# Patient Record
Sex: Female | Born: 1970 | Race: White | Hispanic: No | Marital: Married | Smoking: Never smoker
Health system: Southern US, Community
[De-identification: ages and names within clinical notes are randomized; demographics above are authoritative.]

## PROBLEM LIST (undated history)

## (undated) DIAGNOSIS — L920 Granuloma annulare: Secondary | ICD-10-CM

## (undated) DIAGNOSIS — D649 Anemia, unspecified: Secondary | ICD-10-CM

## (undated) DIAGNOSIS — M2550 Pain in unspecified joint: Secondary | ICD-10-CM

## (undated) DIAGNOSIS — T7840XA Allergy, unspecified, initial encounter: Secondary | ICD-10-CM

## (undated) DIAGNOSIS — K589 Irritable bowel syndrome without diarrhea: Secondary | ICD-10-CM

## (undated) DIAGNOSIS — J45909 Unspecified asthma, uncomplicated: Secondary | ICD-10-CM

## (undated) DIAGNOSIS — N3289 Other specified disorders of bladder: Secondary | ICD-10-CM

## (undated) DIAGNOSIS — G43909 Migraine, unspecified, not intractable, without status migrainosus: Secondary | ICD-10-CM

## (undated) DIAGNOSIS — B009 Herpesviral infection, unspecified: Secondary | ICD-10-CM

## (undated) DIAGNOSIS — E7800 Pure hypercholesterolemia, unspecified: Secondary | ICD-10-CM

## (undated) DIAGNOSIS — E739 Lactose intolerance, unspecified: Secondary | ICD-10-CM

## (undated) DIAGNOSIS — F419 Anxiety disorder, unspecified: Secondary | ICD-10-CM

## (undated) DIAGNOSIS — I10 Essential (primary) hypertension: Secondary | ICD-10-CM

## (undated) DIAGNOSIS — K7581 Nonalcoholic steatohepatitis (NASH): Secondary | ICD-10-CM

## (undated) DIAGNOSIS — N951 Menopausal and female climacteric states: Secondary | ICD-10-CM

## (undated) DIAGNOSIS — G4730 Sleep apnea, unspecified: Secondary | ICD-10-CM

## (undated) DIAGNOSIS — M549 Dorsalgia, unspecified: Secondary | ICD-10-CM

## (undated) DIAGNOSIS — Z8669 Personal history of other diseases of the nervous system and sense organs: Secondary | ICD-10-CM

## (undated) DIAGNOSIS — K7400 Hepatic fibrosis, unspecified: Secondary | ICD-10-CM

## (undated) DIAGNOSIS — Z8709 Personal history of other diseases of the respiratory system: Secondary | ICD-10-CM

## (undated) DIAGNOSIS — T8859XA Other complications of anesthesia, initial encounter: Secondary | ICD-10-CM

## (undated) DIAGNOSIS — G4700 Insomnia, unspecified: Secondary | ICD-10-CM

## (undated) DIAGNOSIS — L739 Follicular disorder, unspecified: Secondary | ICD-10-CM

## (undated) DIAGNOSIS — E119 Type 2 diabetes mellitus without complications: Secondary | ICD-10-CM

## (undated) DIAGNOSIS — M7989 Other specified soft tissue disorders: Secondary | ICD-10-CM

## (undated) DIAGNOSIS — M1990 Unspecified osteoarthritis, unspecified site: Secondary | ICD-10-CM

## (undated) DIAGNOSIS — N390 Urinary tract infection, site not specified: Secondary | ICD-10-CM

## (undated) DIAGNOSIS — T4145XA Adverse effect of unspecified anesthetic, initial encounter: Secondary | ICD-10-CM

## (undated) DIAGNOSIS — R202 Paresthesia of skin: Secondary | ICD-10-CM

## (undated) DIAGNOSIS — D126 Benign neoplasm of colon, unspecified: Secondary | ICD-10-CM

## (undated) HISTORY — DX: Irritable bowel syndrome, unspecified: K58.9

## (undated) HISTORY — DX: Pain in unspecified joint: M25.50

## (undated) HISTORY — DX: Sleep apnea, unspecified: G47.30

## (undated) HISTORY — DX: Allergy, unspecified, initial encounter: T78.40XA

## (undated) HISTORY — DX: Benign neoplasm of colon, unspecified: D12.6

## (undated) HISTORY — DX: Granuloma annulare: L92.0

## (undated) HISTORY — PX: ESOPHAGOGASTRODUODENOSCOPY: SHX1529

## (undated) HISTORY — PX: TYMPANOSTOMY TUBE PLACEMENT: SHX32

## (undated) HISTORY — DX: Type 2 diabetes mellitus without complications: E11.9

## (undated) HISTORY — DX: Other specified soft tissue disorders: M79.89

## (undated) HISTORY — DX: Anxiety disorder, unspecified: F41.9

## (undated) HISTORY — PX: UPPER GASTROINTESTINAL ENDOSCOPY: SHX188

## (undated) HISTORY — DX: Migraine, unspecified, not intractable, without status migrainosus: G43.909

## (undated) HISTORY — DX: Lactose intolerance, unspecified: E73.9

## (undated) HISTORY — PX: OTHER SURGICAL HISTORY: SHX169

## (undated) HISTORY — PX: CARPAL TUNNEL RELEASE: SHX101

## (undated) HISTORY — DX: Herpesviral infection, unspecified: B00.9

## (undated) HISTORY — DX: Dorsalgia, unspecified: M54.9

## (undated) HISTORY — PX: CERVICAL DISC SURGERY: SHX588

## (undated) HISTORY — DX: Nonalcoholic steatohepatitis (NASH): K75.81

## (undated) HISTORY — DX: Menopausal and female climacteric states: N95.1

## (undated) HISTORY — DX: Pure hypercholesterolemia, unspecified: E78.00

## (undated) HISTORY — DX: Essential (primary) hypertension: I10

## (undated) HISTORY — DX: Unspecified osteoarthritis, unspecified site: M19.90

## (undated) HISTORY — PX: COLONOSCOPY: SHX174

---

## 1998-03-01 ENCOUNTER — Emergency Department (HOSPITAL_COMMUNITY): Admission: EM | Admit: 1998-03-01 | Discharge: 1998-03-01 | Payer: Self-pay

## 1998-08-06 ENCOUNTER — Encounter: Admission: RE | Admit: 1998-08-06 | Discharge: 1998-09-01 | Payer: Self-pay | Admitting: Orthopedic Surgery

## 1999-01-15 ENCOUNTER — Other Ambulatory Visit: Admission: RE | Admit: 1999-01-15 | Discharge: 1999-01-15 | Payer: Self-pay | Admitting: Obstetrics and Gynecology

## 2000-11-15 ENCOUNTER — Other Ambulatory Visit: Admission: RE | Admit: 2000-11-15 | Discharge: 2000-11-15 | Payer: Self-pay | Admitting: Obstetrics and Gynecology

## 2001-04-03 ENCOUNTER — Other Ambulatory Visit: Admission: RE | Admit: 2001-04-03 | Discharge: 2001-04-03 | Payer: Self-pay | Admitting: Obstetrics and Gynecology

## 2001-08-30 ENCOUNTER — Ambulatory Visit (HOSPITAL_COMMUNITY): Admission: AD | Admit: 2001-08-30 | Discharge: 2001-08-30 | Payer: Self-pay | Admitting: Internal Medicine

## 2001-11-02 ENCOUNTER — Inpatient Hospital Stay (HOSPITAL_COMMUNITY): Admission: RE | Admit: 2001-11-02 | Discharge: 2001-11-05 | Payer: Self-pay | Admitting: Obstetrics and Gynecology

## 2002-02-10 ENCOUNTER — Emergency Department (HOSPITAL_COMMUNITY): Admission: EM | Admit: 2002-02-10 | Discharge: 2002-02-10 | Payer: Self-pay | Admitting: Emergency Medicine

## 2002-03-19 ENCOUNTER — Encounter: Payer: Self-pay | Admitting: Gastroenterology

## 2002-03-20 ENCOUNTER — Encounter: Payer: Self-pay | Admitting: Gastroenterology

## 2003-01-02 ENCOUNTER — Encounter (INDEPENDENT_AMBULATORY_CARE_PROVIDER_SITE_OTHER): Payer: Self-pay | Admitting: *Deleted

## 2003-01-02 ENCOUNTER — Encounter (HOSPITAL_COMMUNITY): Admission: RE | Admit: 2003-01-02 | Discharge: 2003-02-01 | Payer: Self-pay | Admitting: Family Medicine

## 2003-01-02 ENCOUNTER — Encounter: Payer: Self-pay | Admitting: Family Medicine

## 2003-04-25 ENCOUNTER — Encounter: Payer: Self-pay | Admitting: Family Medicine

## 2003-04-25 ENCOUNTER — Ambulatory Visit (HOSPITAL_COMMUNITY): Admission: RE | Admit: 2003-04-25 | Discharge: 2003-04-25 | Payer: Self-pay | Admitting: Family Medicine

## 2003-07-26 HISTORY — PX: ABDOMINAL HYSTERECTOMY: SHX81

## 2004-07-10 ENCOUNTER — Ambulatory Visit (HOSPITAL_COMMUNITY): Admission: RE | Admit: 2004-07-10 | Discharge: 2004-07-10 | Payer: Self-pay | Admitting: Family Medicine

## 2005-01-12 ENCOUNTER — Inpatient Hospital Stay (HOSPITAL_COMMUNITY): Admission: RE | Admit: 2005-01-12 | Discharge: 2005-01-15 | Payer: Self-pay | Admitting: Obstetrics & Gynecology

## 2005-01-15 ENCOUNTER — Encounter (INDEPENDENT_AMBULATORY_CARE_PROVIDER_SITE_OTHER): Payer: Self-pay | Admitting: *Deleted

## 2005-01-16 ENCOUNTER — Inpatient Hospital Stay (HOSPITAL_COMMUNITY): Admission: EM | Admit: 2005-01-16 | Discharge: 2005-01-19 | Payer: Self-pay | Admitting: Emergency Medicine

## 2005-01-17 ENCOUNTER — Encounter (INDEPENDENT_AMBULATORY_CARE_PROVIDER_SITE_OTHER): Payer: Self-pay | Admitting: *Deleted

## 2005-01-19 ENCOUNTER — Encounter (INDEPENDENT_AMBULATORY_CARE_PROVIDER_SITE_OTHER): Payer: Self-pay | Admitting: *Deleted

## 2005-12-15 ENCOUNTER — Encounter: Admission: RE | Admit: 2005-12-15 | Discharge: 2006-01-24 | Payer: Self-pay | Admitting: Family Medicine

## 2006-05-08 ENCOUNTER — Ambulatory Visit (HOSPITAL_COMMUNITY): Admission: RE | Admit: 2006-05-08 | Discharge: 2006-05-09 | Payer: Self-pay | Admitting: Neurosurgery

## 2007-07-26 HISTORY — PX: BREAST REDUCTION SURGERY: SHX8

## 2008-01-17 ENCOUNTER — Ambulatory Visit (HOSPITAL_COMMUNITY): Admission: RE | Admit: 2008-01-17 | Discharge: 2008-01-17 | Payer: Self-pay | Admitting: Family Medicine

## 2008-03-03 ENCOUNTER — Ambulatory Visit (HOSPITAL_COMMUNITY): Admission: RE | Admit: 2008-03-03 | Discharge: 2008-03-03 | Payer: Self-pay | Admitting: Family Medicine

## 2008-03-20 ENCOUNTER — Ambulatory Visit (HOSPITAL_COMMUNITY): Admission: RE | Admit: 2008-03-20 | Discharge: 2008-03-20 | Payer: Self-pay | Admitting: Family Medicine

## 2008-03-27 ENCOUNTER — Encounter (HOSPITAL_COMMUNITY): Admission: RE | Admit: 2008-03-27 | Discharge: 2008-04-21 | Payer: Self-pay | Admitting: Family Medicine

## 2008-04-25 ENCOUNTER — Emergency Department (HOSPITAL_COMMUNITY): Admission: EM | Admit: 2008-04-25 | Discharge: 2008-04-25 | Payer: Self-pay | Admitting: Emergency Medicine

## 2008-09-09 ENCOUNTER — Encounter: Admission: RE | Admit: 2008-09-09 | Discharge: 2008-09-09 | Payer: Self-pay | Admitting: Sports Medicine

## 2009-05-18 ENCOUNTER — Encounter (INDEPENDENT_AMBULATORY_CARE_PROVIDER_SITE_OTHER): Payer: Self-pay | Admitting: *Deleted

## 2009-06-08 ENCOUNTER — Encounter (INDEPENDENT_AMBULATORY_CARE_PROVIDER_SITE_OTHER): Payer: Self-pay | Admitting: *Deleted

## 2009-06-09 ENCOUNTER — Ambulatory Visit (HOSPITAL_COMMUNITY): Admission: RE | Admit: 2009-06-09 | Discharge: 2009-06-09 | Payer: Self-pay | Admitting: Family Medicine

## 2009-06-09 ENCOUNTER — Encounter (INDEPENDENT_AMBULATORY_CARE_PROVIDER_SITE_OTHER): Payer: Self-pay | Admitting: *Deleted

## 2009-06-14 ENCOUNTER — Ambulatory Visit (HOSPITAL_BASED_OUTPATIENT_CLINIC_OR_DEPARTMENT_OTHER): Admission: RE | Admit: 2009-06-14 | Discharge: 2009-06-14 | Payer: Self-pay | Admitting: Family Medicine

## 2009-06-17 ENCOUNTER — Encounter (INDEPENDENT_AMBULATORY_CARE_PROVIDER_SITE_OTHER): Payer: Self-pay | Admitting: *Deleted

## 2009-06-17 ENCOUNTER — Ambulatory Visit (HOSPITAL_COMMUNITY): Admission: RE | Admit: 2009-06-17 | Discharge: 2009-06-17 | Payer: Self-pay | Admitting: Family Medicine

## 2009-06-20 ENCOUNTER — Ambulatory Visit: Payer: Self-pay | Admitting: Internal Medicine

## 2009-07-10 ENCOUNTER — Ambulatory Visit (HOSPITAL_BASED_OUTPATIENT_CLINIC_OR_DEPARTMENT_OTHER): Admission: RE | Admit: 2009-07-10 | Discharge: 2009-07-10 | Payer: Self-pay | Admitting: Family Medicine

## 2009-07-25 ENCOUNTER — Ambulatory Visit: Payer: Self-pay | Admitting: Internal Medicine

## 2009-12-03 ENCOUNTER — Encounter: Payer: Self-pay | Admitting: Internal Medicine

## 2009-12-03 DIAGNOSIS — R079 Chest pain, unspecified: Secondary | ICD-10-CM

## 2009-12-03 DIAGNOSIS — E1169 Type 2 diabetes mellitus with other specified complication: Secondary | ICD-10-CM | POA: Insufficient documentation

## 2009-12-03 DIAGNOSIS — K7689 Other specified diseases of liver: Secondary | ICD-10-CM

## 2009-12-03 DIAGNOSIS — K7460 Unspecified cirrhosis of liver: Secondary | ICD-10-CM | POA: Insufficient documentation

## 2009-12-03 DIAGNOSIS — I10 Essential (primary) hypertension: Secondary | ICD-10-CM | POA: Insufficient documentation

## 2009-12-03 DIAGNOSIS — E785 Hyperlipidemia, unspecified: Secondary | ICD-10-CM

## 2009-12-28 ENCOUNTER — Encounter: Payer: Self-pay | Admitting: Gastroenterology

## 2010-02-02 ENCOUNTER — Ambulatory Visit: Payer: Self-pay

## 2010-02-02 ENCOUNTER — Ambulatory Visit (HOSPITAL_COMMUNITY): Admission: RE | Admit: 2010-02-02 | Discharge: 2010-02-02 | Payer: Self-pay | Admitting: Internal Medicine

## 2010-02-02 ENCOUNTER — Ambulatory Visit: Payer: Self-pay | Admitting: Internal Medicine

## 2010-02-02 ENCOUNTER — Ambulatory Visit: Payer: Self-pay | Admitting: Cardiology

## 2010-02-02 ENCOUNTER — Encounter: Payer: Self-pay | Admitting: Internal Medicine

## 2010-02-23 ENCOUNTER — Emergency Department (HOSPITAL_COMMUNITY): Admission: EM | Admit: 2010-02-23 | Discharge: 2010-02-23 | Payer: Self-pay | Admitting: Emergency Medicine

## 2010-03-19 ENCOUNTER — Encounter: Payer: Self-pay | Admitting: Gastroenterology

## 2010-03-25 ENCOUNTER — Ambulatory Visit (HOSPITAL_COMMUNITY): Admission: RE | Admit: 2010-03-25 | Discharge: 2010-03-25 | Payer: Self-pay | Admitting: Family Medicine

## 2010-03-25 ENCOUNTER — Encounter: Payer: Self-pay | Admitting: Gastroenterology

## 2010-03-25 ENCOUNTER — Encounter: Payer: Self-pay | Admitting: Internal Medicine

## 2010-04-15 ENCOUNTER — Encounter: Payer: Self-pay | Admitting: Gastroenterology

## 2010-04-20 ENCOUNTER — Encounter (INDEPENDENT_AMBULATORY_CARE_PROVIDER_SITE_OTHER): Payer: Self-pay | Admitting: *Deleted

## 2010-04-22 DIAGNOSIS — K560 Paralytic ileus: Secondary | ICD-10-CM

## 2010-04-22 DIAGNOSIS — F411 Generalized anxiety disorder: Secondary | ICD-10-CM | POA: Insufficient documentation

## 2010-04-22 DIAGNOSIS — Z862 Personal history of diseases of the blood and blood-forming organs and certain disorders involving the immune mechanism: Secondary | ICD-10-CM

## 2010-04-22 DIAGNOSIS — K219 Gastro-esophageal reflux disease without esophagitis: Secondary | ICD-10-CM | POA: Insufficient documentation

## 2010-04-22 DIAGNOSIS — Z8639 Personal history of other endocrine, nutritional and metabolic disease: Secondary | ICD-10-CM

## 2010-04-22 DIAGNOSIS — G4730 Sleep apnea, unspecified: Secondary | ICD-10-CM

## 2010-04-22 DIAGNOSIS — K589 Irritable bowel syndrome without diarrhea: Secondary | ICD-10-CM

## 2010-05-15 ENCOUNTER — Encounter: Payer: Self-pay | Admitting: Gastroenterology

## 2010-05-27 ENCOUNTER — Ambulatory Visit: Payer: Self-pay | Admitting: Gastroenterology

## 2010-06-09 ENCOUNTER — Ambulatory Visit (HOSPITAL_COMMUNITY): Admission: RE | Admit: 2010-06-09 | Discharge: 2010-06-09 | Payer: Self-pay | Admitting: Gastroenterology

## 2010-06-11 ENCOUNTER — Telehealth: Payer: Self-pay | Admitting: Gastroenterology

## 2010-06-14 ENCOUNTER — Telehealth: Payer: Self-pay | Admitting: Gastroenterology

## 2010-06-22 ENCOUNTER — Ambulatory Visit: Payer: Self-pay | Admitting: Gastroenterology

## 2010-08-14 ENCOUNTER — Encounter: Payer: Self-pay | Admitting: Family Medicine

## 2010-08-26 NOTE — Progress Notes (Signed)
Summary: Biopsy results?  Phone Note Call from Patient Call back at Home Phone [phone]   Call For: Dr Sharlett Iles Reason for Call: Lab or Test Results Summary of Call: Wonders if we received her Biopsy results yet? Initial call taken by: Irwin Brakeman [facility],  June 11, 2010 2:47 PM  Follow-up for Phone Call        pt aware we will call her when we get them back. no results yet. Follow-up by: Bernita Buffy CMA Deborra Medina),  June 11, 2010 2:55 PM     Appended Document: Biopsy results? fatty liver..see me  Appended Document: Biopsy results? see phone note

## 2010-08-26 NOTE — Letter (Signed)
Summary: [facility]   Imported By: Madlyn Frankel CMA (AAMA) 04/22/2010 12:00:26  _____________________________________________________________________  External Attachment:    Type:   Image     Comment:   External Document

## 2010-08-26 NOTE — Assessment & Plan Note (Signed)
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  She is married and has two children.  She comes with the youngest of which is 33 old.  She is referred today through [facility] emergency room because of episodic epigastric abdominal pain.  It has been present intermittently over the last year.  The patient has "spells" of epigastric pain radiating to her back, which usually lasts one to two days in duration and is colicky in nature with mild nausea.  She most recently went to the emergency room on March 19, 2002 because of pain and had a normal amylase, lipase, liver function test, CBC and urinalysis.  She is not scheduled for any x-ray examinations.  The patient has had rather minor acid reflux symptoms, one to two times a week for the last several years.  She uses very periodic antacids.  She denies dysphagia, any specific hepatobiliary complaint such as clay-colored stools, dark urine, icterus, fever or chills.  She has slight constipation but denies melena, hematochezia, or abuse of laxatives.    SOCIAL HISTORY:  She does not smoke or abuse ethanol.    MEDICATIONS:  She currently is on no medications except for p.r.n. Senokot.    PAST SURGICAL HISTORY:  She has had no previous surgical procedures except for two cesarean sections.  REVIEW OF SYSTEMS:  As detailed in the chart and is generally noncontributory.  Her last child was born on November 02, 2001, and she apparently has not cycled since childbirth.  She is on the "mini-pill", and is followed by a local gynecologist.  She is married, has two children, and mostly works at home at this time.  FAMILY HISTORY:  Remarkable for a paternal grandmother with gallbladder disease and a maternal grandmother with colon carcinoma.  Her mother has hypertension and  ovarian carcinoma.  PHYSICAL EXAMINATION:  She is 5 feet, 5 inches tall and weighs 174 pounds.  Her blood pressure is 112/80 and pulse is 72 and regular.  I cannot appreciate stigmata of chronic liver disease.  Her chest is entirely clear.  There are no murmurs, gallops or rubs noted.  Her abdomen shows no organomegaly, masses, or significant tenderness.  Bowel sounds are normal.  Peripheral extremities are unremarkable.  Mental status is normal.  ASSESSMENT:   1.  Episodic subxiphoid pain radiating to the back - rule out cholelithiasis versus acid reflux disease. 2.  Mild functional constipation. 3.  Symptoms of mild chronic gastroesophageal reflux disease.  RECOMMENDATIONS: 1.  Upper abdominal ultrasound exam to exclude cholelithiasis. 2.  If ultrasound exam is unremarkable, we will probably treat empirically with Protonix 40 mg a day for six to eight weeks.  The patient is breastfeeding, but I do not think the amount of proton pump inhibitor that would cross into the breast mild will harm her infant but will have her check with her gynecologist before proceeding further. 3.  Consider endoscopy or 24-hour pH probe test depending on clinical course and workup.    Loralee Pacas. Sharlett Iles, M.D., F.A.C.P., F.A.C.G.  [id number] D:  03/19/02; T:  ; Job [id number]

## 2010-08-26 NOTE — Progress Notes (Signed)
Summary: Biopsy results  Phone Note Call from Patient Call back at Home Phone [phone]   Caller: Patient Call For: Dr. Sharlett Iles Reason for Call: Lab or Test Results Summary of Call: Calling about liver biopsy results Initial call taken by: Webb Laws,  June 14, 2010 4:43 PM  Follow-up for Phone Call        appt 07/22/2010 Follow-up by: Bernita Buffy CMA Deborra Medina),  June 14, 2010 4:54 PM

## 2010-08-26 NOTE — Discharge Summary (Signed)
Summary: Discharge #2  NAMEKIMLEY, [name]               ACCOUNT NO.:  [id number]   MEDICAL RECORD NO.:  [id number]          PATIENT TYPE:  INP   LOCATION:  A417                          FACILITY:  [facility]   PHYSICIAN:  Florian Buff, M.D.   DATE OF BIRTH:  [birth year]   DATE OF ADMISSION:  01/12/2005  DATE OF DISCHARGE:  06/24/2006LH                                 DISCHARGE SUMMARY   DISCHARGE DIAGNOSES:  1.  Status post TAH BSO.  2.  Unremarkable postoperative course.  3.  Routine postoperative care.   Please refer to the transcribed History and Physical and Op Note for details  of admission to the hospital.   HOSPITAL COURSE:  The patient was admitted after surgery.  Her postoperative  course was unremarkable except for significant pain which we assumed would  happen because she used non-narcotics at home.  Her hemoglobin and  hematocrit were 12.4 and 35.9, postop white count of 16,500 on postop day  #1.  Her postop day #3, hemoglobin and hematocrit were still stable at 11.0  and 32 with a white count of 10,000.  She remained afebrile, stable vital  signs.  Tolerated clear liquids and regular diet.  She voided without  symptoms and was extensively ambulatory.  She was discharged to home on January 15, 2005, by Dr. Glo Herring on Levaquin and Tylox for pain.  He was given  instructions to follow up in the office the following Wednesday to have her  staples removed and Steri-Strips placed.       LHE/MEDQ  D:  02/23/2005  T:  02/23/2005  Job:  [id number]

## 2010-08-26 NOTE — Letter (Signed)
Summary: [facility]   Imported By: Rise Patience 05/31/2010 13:54:55  _____________________________________________________________________  External Attachment:    Type:   Image     Comment:   External Document

## 2010-08-26 NOTE — Assessment & Plan Note (Signed)
Summary: elevated LFT- [name]   History of Present Illness Visit Type: Initial Consult Primary GI MD: Verl Blalock MD [name] Primary Provider: [name], MD Requesting Provider: [name], MD Chief Complaint: elevated LFT's, denies any GI symtpoms History of Present Illness:   40 year old Caucasian female referred by [name] for evaluation of abnormal liver transaminases approximately twice normal for the last year.  This patient is completely asymptomatic in terms of any gastrointestinal or hepatobiliary complaints. She's never had hepatitis or known liver disease, pancreatitis, or metabolic disorders except for hyperlipidemia and hypertension. Her serum transaminases are approximately twice normal with otherwise normal bilirubin, alkaline phosphatase, and albumin level. She has mixed hyperlipidemia, but has had an normal values for her iron studies, negative ANA, negative hepatitis serologies, normal ceruloplasmin and alpha-fetoprotein level. Coagulation studies also been normal.  She denies any systemic complaints her mental status changes. There is no history of illicit drug use or alcohol abuse. She does have chronic anxiety syndrome and is on Klonopin 0.5 mg a day. Also she takes Celexa 40 mg a day, and daily Premarin. Recent imaging study showed CT and ultrasound exam in September. Family history is noncontributory. The patient has had previous total abdominal hysterectomy and removal of both ovaries in 2006. I previously saw her in 2003 at which time she had a normal upper abdominal ultrasound exam and was treated for acid reflux. I do not see liver enzymes from that time. She is on multivitamins but denies excessive use of vitamin A. She has gained approximately 50 pounds in weight over the last 10 years. There is no history of any cardiovascular pulmonary complaints except for asthma treated with inhalers.    GI Review of Systems      Denies abdominal pain, acid reflux,  belching, bloating, chest pain, dysphagia with liquids, dysphagia with solids, heartburn, loss of appetite, nausea, vomiting, vomiting blood, weight loss, and  weight gain.      Reports irritable bowel syndrome and  liver problems.     Denies anal fissure, black tarry stools, change in bowel habit, constipation, diarrhea, diverticulosis, fecal incontinence, heme positive stool, hemorrhoids, jaundice, light color stool, rectal bleeding, and  rectal pain. Preventive Screening-Counseling & Management  Alcohol-Tobacco     Smoking Status: never    Current Medications (verified): 1)  Lisinopril 10 Mg Tabs (Lisinopril) .... Once Daily 2)  Hydrochlorothiazide 25 Mg Tabs (Hydrochlorothiazide) .... One Tablet By Mouth Once Daily 3)  Singulair 10 Mg Tabs (Montelukast Sodium) .... Take One Tablet By Mouth Once Daily 4)  Premarin 1.25 Mg Tabs (Estrogens Conjugated) .... One Tablet By Mouth Once Daily 5)  Celexa 40 Mg Tabs (Citalopram Hydrobromide) .... One Tablet By Mouth Once Daily 6)  Ditropan Xl 10 Mg Xr24h-Tab (Oxybutynin Chloride) .... Take One Tablet Once Daily 7)  Multivitamins  Tabs (Multiple Vitamin) .... Once Daily 8)  Vitamin B-12 100 Mcg Tabs (Cyanocobalamin) .... One Tablet By Mouth Once Daily 9)  Dialyvite Supreme D 3 Mg Tabs (Multiple Vitamins-Minerals-Fa) .... Once Daily 10)  Klonopin 0.5 Mg Tabs (Clonazepam) .Marland Kitchen.. 1 Tab Once Daily 11)  Symbicort 160-4.5 Mcg/act Aero (Budesonide-Formoterol Fumarate) .... As Dircted 12)  Acyclovir 400 Mg Tabs (Acyclovir) .... One Tablet By Mouth Once Daily  Allergies (verified): No Known Drug Allergies  Past History:  Past medical, surgical, family and social histories (including risk factors) reviewed for relevance to current acute and chronic problems.  Past Medical History: Chest pain elevated liver enzymes-2011 hypertension irritable bowel syndrome Hx menstrual  dysfunction Dyslipidemia Anxiety Disorder Asthma Sleep Apnea  Past Surgical  History: Reviewed history from 04/22/2010 and no changes required. C-section x2  C4-C5 anterior cervical diskectomy Status post total abdominal hysterectomy bilateral salpingo-oophorectomy  Family History: Reviewed history from 04/22/2010 and no changes required. maternal GF with premature CAD Fother died of cirrhosis Family History of Colon Cancer: Maternal Grandmother Family History of Ovarian Cancer: Mother Family History of Diabetes: Father  Social History: Reviewed history from 04/22/2010 and no changes required. Married NO tobacco occasional EtOH Illicit Drug Use - no Patient has never smoked.  Smoking Status:  never  Review of Systems       The patient complains of allergy/sinus, anxiety-new, back pain, blood in urine, fatigue, headaches-new, muscle pains/cramps, shortness of breath, sore throat, and swelling of feet/legs.  The patient denies anemia, arthritis/joint pain, breast changes/lumps, change in vision, confusion, cough, coughing up blood, depression-new, fainting, fever, hearing problems, heart murmur, heart rhythm changes, itching, menstrual pain, night sweats, nosebleeds, pregnancy symptoms, skin rash, sleeping problems, swollen lymph glands, thirst - excessive , urination - excessive , urination changes/pain, urine leakage, vision changes, and voice change.   Eyes:  Denies blurring, diplopia, irritation, discharge, vision loss, scotoma, eye pain, and photophobia. ENT:  Complains of sore throat; denies earache, ear discharge, tinnitus, decreased hearing, nasal congestion, loss of smell, nosebleeds, hoarseness, and difficulty swallowing. CV:  Complains of dyspnea on exertion; denies chest pains, angina, palpitations, syncope, orthopnea, PND, peripheral edema, and claudication. GI:  Complains of gas/bloating and constipation; denies difficulty swallowing, pain on swallowing, nausea, indigestion/heartburn, vomiting, vomiting blood, abdominal pain, jaundice, diarrhea,  change in bowel habits, bloody BM's, black BMs, and fecal incontinence. GU:  Denies urinary burning, blood in urine, nocturnal urination, urinary frequency, urinary incontinence, abnormal vaginal bleeding, amenorrhea, menorrhagia, vaginal discharge, pelvic pain, genital sores, painful intercourse, and decreased libido. MS:  Complains of low back pain; denies joint pain / LOM, joint swelling, joint stiffness, joint deformity, muscle weakness, muscle cramps, muscle atrophy, leg pain at night, leg pain with exertion, and shoulder pain / LOM hand / wrist pain (CTS). Derm:  Denies rash, itching, dry skin, hives, moles, warts, and unhealing ulcers. Neuro:  Denies weakness, paralysis, abnormal sensation, seizures, syncope, tremors, vertigo, transient blindness, frequent falls, frequent headaches, difficulty walking, headache, sciatica, radiculopathy other:, restless legs, memory loss, and confusion. Psych:  Denies depression, anxiety, memory loss, suicidal ideation, hallucinations, paranoia, phobia, and confusion; chronic sleep difficulties.. Endo:  Complains of polydipsia; denies cold intolerance, heat intolerance, polyphagia, polyuria, unusual weight change, and hirsutism. Heme:  Denies bruising, bleeding, enlarged lymph nodes, and pagophagia. Allergy:  Complains of recurrent infections.  Vital Signs:  Patient profile:   40 year old female Height:      65 inches Weight:      175.38 pounds BMI:     29.29 Pulse rate:   90 / minute Pulse rhythm:   regular BP sitting:   112 / 72  (right arm) Cuff size:   large  Vitals Entered By: Marlon Pel CMA Deborra Medina) (May 27, 2010 9:50 AM)  Physical Exam  General:  Well developed, well nourished, no acute distress.healthy appearing.   Head:  Normocephalic and atraumatic. Eyes:  PERRLA, no icterus. Neck:  Supple; no masses or thyromegaly. Lungs:  Clear throughout to auscultation. Heart:  Regular rate and rhythm; no murmurs, rubs,  or bruits. Abdomen:   Soft, nontender and nondistended. No masses, hepatosplenomegaly or hernias noted. Normal bowel sounds.no hepatosplenomegaly noted abdominal tenderness or ascites. There  is no hepatic bruit or abnormal bowel sounds. Msk:  Symmetrical with no gross deformities. Normal posture. Extremities:  No clubbing, cyanosis, edema or deformities noted. Neurologic:  Alert and  oriented x4;  grossly normal neurologically. Cervical Nodes:  No significant cervical adenopathy. Psych:  Alert and cooperative. Normal mood and affect.   Impression & Recommendations:  Problem # 1:  LIVER FUNCTION TESTS, ABNORMAL, HX OF (ICD-V12.2) Assessment Unchanged Workup consistent with fatty infiltration of the liver-rule out Nash syndrome and early cirrhosis. We had a long discussion today concerning fatty liver disease and the possibility of Nash syndrome and eventual cirrhosis. Liver biopsy has been recommended as a baseline study, for prognostic purposes, and also to exclude other unusual types of chronic liver disease. Once this has been completed we will decide on other options of therapy. She will definitely need referral to dietary for weight loss reduction, treatment of her hypertriglyceridemia and hypercholesterolemia, and also institute an aerobic exercise program. She has been asked to avoid all unnecessary medications. There is no evidence of chronic liver disease exam or any early encephalopathy. Her workup by [name] to date has been excellent and complete.It Is of Note that her father died of alcoholic liver cirrhosis. Orders: CT/ULS Guided Liver Biospy (CT/ULS Guided Liv BX)  Problem # 2:  SLEEP APNEA (ICD-780.57) Assessment: Unchanged  Problem # 3:  IRRITABLE BOWEL SYNDROME (ICD-564.1) Assessment: Improved  Problem # 4:  HYPERLIPIDEMIA-MIXED (TMA-263.4) Assessment: Unchanged  Patient Instructions: 1)  Copy sent to : [name], MD 2)  Fatty LIver Handout given today. 3)  I have faxed all of your  records to Radiology and they will call you to set up your liver Biopsy. 4)  The medication list was reviewed and reconciled.  All changed / newly prescribed medications were explained.  A complete medication list was provided to the patient / caregiver.

## 2010-08-26 NOTE — Assessment & Plan Note (Signed)
Summary: np6/chest pain/pt seen at [facility]   Visit Type:  Follow-up Primary Provider:  Dr [name]  CC:  chest pain-sob.  History of Present Illness: [name] is a 40 year old who presents for evaluation of chest pain.  SHe has no history of CAD Per her report on 5/8 she develped L parasternal chest pain that was bad.  It would come and go.  Not associated with activity. No relation to breathing or movement.  No symptoms to suggest GERD.   She went to the ER in [place]  Pain lasted most of day.  They sent her home that evening.  She has not had any episodes since.  Current Medications (verified): 1)  Lisinopril 10 Mg Tabs (Lisinopril) .... Once Daily 2)  Hydrochlorothiazide 12.5 Mg Tabs (Hydrochlorothiazide) .... Take One Tablet By Mouth Once Daily 3)  Singulair 10 Mg Tabs (Montelukast Sodium) .... Take One Tablet By Mouth Once Daily 4)  Estrogen 5)  Celexa 20 Mg Tabs (Citalopram Hydrobromide) .... Once Daily 6)  Ditropan Xl 10 Mg Xr24h-Tab (Oxybutynin Chloride) .... Take One Tablet Once Daily 7)  Multivitamins  Tabs (Multiple Vitamin) .... Once Daily 8)  B Complex  Tabs (B Complex Vitamins) .... Take One Tablet Once Daily 9)  Dialyvite Supreme D 3 Mg Tabs (Multiple Vitamins-Minerals-Fa) .... Once Daily 10)  Klonopin 0.5 Mg Tabs (Clonazepam) .Marland Kitchen.. 1 Tab Once Daily 11)  5htp Otc Vitamin .Marland Kitchen.. 1 Tab Once Daily 12)  Symbicort 160-4.5 Mcg/act Aero (Budesonide-Formoterol Fumarate) .... As Dircted  Allergies (verified): No Known Drug Allergies  Past History:  Past Surgical History: Last updated: 12/03/2009 C-section C4-C5 anterior cervical diskectomy Status post total abdominal hysterectomy bilateral salpingo-oophorectomy  Past Medical History: Chest pain elevated liver enzymes-2011 hypertension irritable bowel syndrome Hx menstrual dysfunction Dyslipidemia  Family History: maternal GF with premature CAD Fother died of cirrhosis  Social History: NO tobacco,  occasional EtOH  Review of Systems       All systems reviewed.  Negative to the above problem except as noted above.  Vital Signs:  [name] profile:   40 year old female Height:      65 inches Weight:      187 pounds BMI:     31.23 Pulse rate:   91 / minute BP sitting:   145 / 96  (left arm) Cuff size:   large  Vitals Entered By: Lubertha Basque, CNA (Dec 03, 2009 3:09 PM)  Physical Exam  Additional Exam:  [name] is in NAD HEENT:  Normocephalic, atraumatic. EOMI, PERRLA.  Neck: JVP is normal. No thyromegaly. No bruits.  Lungs: clear to auscultation. No rales no wheezes. Moving air well. Heart: Regular rate and rhythm. Normal S1, S2. No S3.   No significant murmurs. PMI not displaced.  Abdomen:  Supple, nontender. Normal bowel sounds. No masses. No hepatomegaly.  Extremities:   Good distal pulses throughout. No lower extremity edema.  Musculoskeletal :moving all extremities.  Neuro:   alert and oriented x3.    EKG  Procedure date:  12/03/2009  Findings:      NSR.  91 bpm.  Impression & Recommendations:  Problem # 1:  CHEST PAIN UNSPECIFIED (ICD-786.50) I am not sure what this represents.  The [name] has a metabolic syndrome profile.  WIth this I would recommend a stress echo to evlauate. EcASA for now.  Activities as tolerated.  Problem # 2:  [id number] (YQI-347.4) Per S. Luking last total cholesteral was 250, LDL was 139, HDL 41, Triglycerides 351.  Will need diet and poss further intervention.  Problem # 3:  HYPERTENSION (ICD-401.9) BP up today.  will need to be followed.  Problem # 4:  FATTY LIVER DISEASE (ICD-571.8) Followed by S. Luking.  Other Orders: EKG w/ Interpretation (93000) Stress Echo (Stress Echo)

## 2010-08-26 NOTE — Discharge Summary (Signed)
Summary: Postoperative Ileus, IBS  NAME:  [name], [name]               ACCOUNT NO.:  [id number]   MEDICAL RECORD NO.:  [id number]          PATIENT TYPE:  INP   LOCATION:  A331                          FACILITY:  [facility]   PHYSICIAN:  Florian Buff, M.D.   DATE OF BIRTH:  [birth year]   DATE OF ADMISSION:  01/16/2005  DATE OF DISCHARGE:  06/28/2006LH                                 DISCHARGE SUMMARY   DISCHARGE DIAGNOSES:  1.  Status post total abdominal hysterectomy bilateral salpingo-      oophorectomy.  2.  Postoperative ileus.  3.  History of irritable bowel syndrome.  4.  Questionable wound cellulitis which turned out to be negative.   HISTORY OF PRESENT ILLNESS:  Please refer to the history and physical and  the previous history and physical and operative note for details of  admission to the hospital.   HOSPITAL COURSE:  The patient was admitted saying she had had a fever of  100.5 at home with shaking chills and that her incision was looking worse.  She came in through the ER and was admitted to the hospital.  She had a  white count of 10,100 with no significant shift.  She was started on  doxycycline IV as well as Cleocin.  I stopped the Cleocin and put her on  Bactrim and stopped the doxycycline as well.  When I saw her the morning  after she came in through the ER she really had no cellulitis, no evidence  of any wound infection at all, it was just a normal looking incision.  She  has been afebrile in the hospital the entire time.  Her highest temperature  has been 98.7 and her white count went down to 9100 again with unremarkable  shift.  She did have hypoactive bowel sounds and was bloated.  I  followed  an exam and did abdominal series and she had obstipation and she did offer  that she has a history of irritable bowel predominantly of constipation and  she has also been nauseated.  She was tolerating everything orally when she  was in the hospital last week and she was  doing not as well at home.  We  gave her a couple of large enemas as well as discontinuing all IV pain  medicine and bowel sounds became better and her bloating became less.   DISPOSITION:  As a result she was discharged home the morning of January 19, 2005 on Bactrim to take for five more days twice a day, Phenergan for nausea  and MiraLax powder.  She has Tylox at home for pain.  Her staples were  removed and Steri-Strips were placed.  Her incision is clean, dry and  intact.  No cellulitis, no evidence of any infection at all and she will be  seen back in the office in one week.       LHE/MEDQ  D:  01/19/2005  T:  01/19/2005  Job:  [id number]

## 2010-08-26 NOTE — Assessment & Plan Note (Signed)
Summary: follow up liver bx./lk   History of Present Illness Visit Type: Follow-up Visit Primary GI MD: Verl Blalock MD [place] Primary Provider: [name], MD Requesting Provider: na Chief Complaint: F/u from liver biopsy. Patient states that she was having nausea and headaches on Sunday and last night pain near her liver  History of Present Illness:   Office followup from liver biopsy performed with ultrasound guidance. The patient is currently asymptomatic in terms of any gastrointestinal or hepatobiliary complaints. She apparently has marked hyper cholesterolemia but is not on a statin medication at this time pending hepatology  workup,   GI Review of Systems    Reports abdominal pain and  nausea.     Location of  Abdominal pain: upper abdomen.    Denies acid reflux, belching, bloating, chest pain, dysphagia with liquids, dysphagia with solids, heartburn, loss of appetite, vomiting, vomiting blood, weight loss, and  weight gain.      Reports liver problems.     Denies anal fissure, black tarry stools, change in bowel habit, constipation, diarrhea, diverticulosis, fecal incontinence, heme positive stool, hemorrhoids, irritable bowel syndrome, jaundice, light color stool, rectal bleeding, and  rectal pain.    Current Medications (verified): 1)  Lisinopril 10 Mg Tabs (Lisinopril) .... Once Daily 2)  Hydrochlorothiazide 25 Mg Tabs (Hydrochlorothiazide) .... One Tablet By Mouth Once Daily 3)  Singulair 10 Mg Tabs (Montelukast Sodium) .... Take One Tablet By Mouth Once Daily 4)  Premarin 1.25 Mg Tabs (Estrogens Conjugated) .... One Tablet By Mouth Once Daily 5)  Celexa 40 Mg Tabs (Citalopram Hydrobromide) .... One Tablet By Mouth Once Daily 6)  Ditropan Xl 10 Mg Xr24h-Tab (Oxybutynin Chloride) .... Take One Tablet Once Daily 7)  Multivitamins  Tabs (Multiple Vitamin) .... Once Daily 8)  Vitamin B-12 100 Mcg Tabs (Cyanocobalamin) .... One Tablet By Mouth Once Daily 9)  Dialyvite  Supreme D 3 Mg Tabs (Multiple Vitamins-Minerals-Fa) .... Once Daily 10)  Klonopin 0.5 Mg Tabs (Clonazepam) .Marland Kitchen.. 1 Tab Once Daily 11)  Symbicort 160-4.5 Mcg/act Aero (Budesonide-Formoterol Fumarate) .... As Dircted 12)  Acyclovir 400 Mg Tabs (Acyclovir) .... One Tablet By Mouth Once Daily  Allergies (verified): No Known Drug Allergies  Past History:  Past medical, surgical, family and social histories (including risk factors) reviewed for relevance to current acute and chronic problems.  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 maternal GF with premature CAD Fother died of cirrhosis Family History of Colon Cancer: Maternal Grandmother Family History of Ovarian Cancer: Mother Family History of Diabetes: Father  Social History: Reviewed history from 05/27/2010 and no changes required. Occuption: [employer] Married NO tobacco occasional EtOH Illicit Drug Use - no Patient has never smoked.   Review of Systems       The patient complains of allergy/sinus and headaches-new.  The patient denies anemia, anxiety-new, arthritis/joint pain, back pain, blood in urine, breast changes/lumps, change in vision, confusion, cough, coughing up blood, depression-new, fainting, fatigue, fever, hearing problems, heart murmur, heart rhythm changes, itching, menstrual pain, muscle  pains/cramps, night sweats, nosebleeds, pregnancy symptoms,  shortness of breath, skin rash, sleeping problems, sore throat, swelling of feet/legs, swollen lymph glands, thirst - excessive , urination - excessive , urination changes/pain, urine leakage, vision changes, and voice change.    Vital Signs:  Patient profile:   40 year old female Height:      65 inches Weight:      183 pounds BMI:     30.56 BSA:     1.91 Pulse rate:   88 / minute Pulse rhythm:   regular BP sitting:   132 / 86  (left arm) Cuff size:   regular  Vitals Entered By: Hope Pigeon CMA (June 22, 2010 10:50 AM)  Physical Exam  General:  Well developed, well nourished, no acute distress.healthy appearing.  No Stigmata of chronic liver disease noted Head:  Normocephalic and atraumatic. Eyes:  PERRLA, no icterus.exam deferred to patient's ophthalmologist.   Abdomen:  Soft, nontender and nondistended. No masses, hepatosplenomegaly or hernias noted. Normal bowel sounds. Psych:  Alert and cooperative. Normal mood and affect.   Impression & Recommendations:  Problem # 1:  FATTY LIVER DISEASE (ICD-571.8) Assessment Unchanged Liver biopsy results reviewed today with Dr. Claudette Laws and pathologic. This patient has rather classical low-grade NASH syndrome without evidence of significant fibrosis or cirrhosis or active inflammation that would warrant therapy. I have reviewed these findings with the patient and have suggested that she continue to keep her weight under control. Her BMI today is 30.56 which does not put her in the obese category. Apparently she has marked hyper cholesterolemia, and I see no contraindication to statin initiation per primary care. I would recommend yearly liver function test. I have offered her a second opinion at [facility] or [facility] if she wishes. Currently, there is no particular therapy I would recommend for her very mild fatty liver other than weight control.  Problem # 2:   IRRITABLE BOWEL SYNDROME (ICD-564.1) Assessment: Improved  Patient Instructions: 1)  Copy sent to : [name], MD 2)  Information on Fatty Liver given. 3)  The medication list was reviewed and reconciled.  All changed / newly prescribed medications were explained.  A complete medication list was provided to the patient / caregiver.

## 2010-08-26 NOTE — Letter (Signed)
Summary: New Patient letter  [facility]  [address] [place], [place] [address]   Phone: [phone]  Fax: [phone]       03/25/2010 MRN: [id number]  [place], [place]  [address]  Dear [name],  Welcome to the Gastroenterology Division at [facility].    You are scheduled to see Dr.  Olevia Perches  on 04-28-10 at 8:45 a.m.  on the 3rd floor at [facility], [place] [employer].  We ask that you try to arrive at our office 15 minutes prior to your appointment time to allow for check-in.  We would like you to complete the enclosed self-administered evaluation form prior to your visit and bring it with you on the day of your appointment.  We will review it with you.  Also, please bring a complete list of all your medications or, if you prefer, bring the medication bottles and we will list them.  Please bring your insurance card so that we may make a copy of it.  If your insurance requires a referral to see a specialist, please bring your referral form from your primary care physician.  Co-payments are due at the time of your visit and may be paid by cash, check or credit card.     Your office visit will consist of a consult with your physician (includes a physical exam), any laboratory testing he/she may order, scheduling of any necessary diagnostic testing (e.g. x-ray, ultrasound, CT-scan), and scheduling of a procedure (e.g. Endoscopy, Colonoscopy) if required.  Please allow enough time on your schedule to allow for any/all of these possibilities.    If you cannot keep your appointment, please call [phone] to cancel or reschedule prior to your appointment date.  This allows [place] the opportunity to schedule an appointment for another patient in need of care.  If you do not cancel or reschedule by 5 p.m. the business day prior to your appointment date, you will be charged a $50.00 late cancellation/no-show fee.    Thank you for choosing  [facility] for your medical needs.  We appreciate the opportunity to care for you.  Please visit [place] at our website  to learn more about our practice.                     Sincerely,                                                             The Gastroenterology Division

## 2010-08-26 NOTE — Letter (Signed)
Summary: 07/30/09-12/28/09 [place]  07/30/09-12/28/09 [place]   Imported By: Rise Patience 05/31/2010 14:06:07  _____________________________________________________________________  External Attachment:    Type:   Image     Comment:   External Document

## 2010-08-26 NOTE — Letter (Signed)
Summary: [facility]   Imported By: Madlyn Frankel CMA (AAMA) 04/22/2010 12:04:06  _____________________________________________________________________  External Attachment:    Type:   Image     Comment:   External Document

## 2010-09-27 ENCOUNTER — Encounter (HOSPITAL_COMMUNITY)
Admission: RE | Admit: 2010-09-27 | Discharge: 2010-09-27 | Disposition: A | Discharge status: Home / Self Care | Payer: BC Managed Care – PPO | Source: Ambulatory Visit | Attending: Neurosurgery | Admitting: Neurosurgery

## 2010-09-27 ENCOUNTER — Other Ambulatory Visit (HOSPITAL_COMMUNITY): Payer: Self-pay | Admitting: Neurosurgery

## 2010-09-27 ENCOUNTER — Ambulatory Visit (HOSPITAL_COMMUNITY)
Admission: RE | Admit: 2010-09-27 | Discharge: 2010-09-27 | Disposition: A | Discharge status: Home / Self Care | Payer: BC Managed Care – PPO | Source: Ambulatory Visit | Attending: Neurosurgery | Admitting: Neurosurgery

## 2010-09-27 DIAGNOSIS — Z01818 Encounter for other preprocedural examination: Secondary | ICD-10-CM | POA: Insufficient documentation

## 2010-09-27 DIAGNOSIS — Z01812 Encounter for preprocedural laboratory examination: Secondary | ICD-10-CM | POA: Insufficient documentation

## 2010-09-27 DIAGNOSIS — Z01810 Encounter for preprocedural cardiovascular examination: Secondary | ICD-10-CM | POA: Insufficient documentation

## 2010-09-27 DIAGNOSIS — Z01811 Encounter for preprocedural respiratory examination: Secondary | ICD-10-CM

## 2010-09-27 LAB — CBC
HCT: 40.4 % (ref 36.0–46.0)
Hemoglobin: 13.5 g/dL (ref 12.0–15.0)
MCH: 31 pg (ref 26.0–34.0)
MCHC: 33.4 g/dL (ref 30.0–36.0)
MCV: 92.9 fL (ref 78.0–100.0)
Platelets: 347 10*3/uL (ref 150–400)
RBC: 4.35 MIL/uL (ref 3.87–5.11)
RDW: 12.6 % (ref 11.5–15.5)
WBC: 10.2 10*3/uL (ref 4.0–10.5)

## 2010-09-27 LAB — COMPREHENSIVE METABOLIC PANEL
ALT: 37 U/L — ABNORMAL HIGH (ref 0–35)
AST: 39 U/L — ABNORMAL HIGH (ref 0–37)
Albumin: 4.2 g/dL (ref 3.5–5.2)
Alkaline Phosphatase: 65 U/L (ref 39–117)
BUN: 7 mg/dL (ref 6–23)
CO2: 32 mEq/L (ref 19–32)
Calcium: 9.7 mg/dL (ref 8.4–10.5)
Chloride: 99 mEq/L (ref 96–112)
Creatinine, Ser: 0.89 mg/dL (ref 0.4–1.2)
GFR calc Af Amer: >60 mL/min (ref >60)
GFR calc non Af Amer: >60 mL/min (ref >60)
Glucose, Bld: 92 mg/dL (ref 70–99)
Potassium: 4.4 mEq/L (ref 3.5–5.1)
Sodium: 137 mEq/L (ref 135–145)
Total Bilirubin: 1.1 mg/dL (ref 0.3–1.2)
Total Protein: 7.4 g/dL (ref 6.0–8.3)

## 2010-09-27 LAB — NO BLOOD PRODUCTS

## 2010-09-27 LAB — SURGICAL PCR SCREEN
MRSA, PCR: NEGATIVE
Staphylococcus aureus: POSITIVE — AB

## 2010-09-30 ENCOUNTER — Inpatient Hospital Stay (HOSPITAL_COMMUNITY): Payer: BC Managed Care – PPO

## 2010-09-30 ENCOUNTER — Ambulatory Visit (HOSPITAL_COMMUNITY)
Admission: RE | Admit: 2010-09-30 | Discharge: 2010-10-01 | Disposition: A | Discharge status: Home / Self Care | Payer: BC Managed Care – PPO | Source: Ambulatory Visit | Attending: Neurosurgery | Admitting: Neurosurgery

## 2010-09-30 DIAGNOSIS — K219 Gastro-esophageal reflux disease without esophagitis: Secondary | ICD-10-CM | POA: Insufficient documentation

## 2010-09-30 DIAGNOSIS — I10 Essential (primary) hypertension: Secondary | ICD-10-CM | POA: Insufficient documentation

## 2010-09-30 DIAGNOSIS — J45909 Unspecified asthma, uncomplicated: Secondary | ICD-10-CM | POA: Insufficient documentation

## 2010-09-30 DIAGNOSIS — M5020 Other cervical disc displacement, unspecified cervical region: Secondary | ICD-10-CM | POA: Insufficient documentation

## 2010-09-30 DIAGNOSIS — F411 Generalized anxiety disorder: Secondary | ICD-10-CM | POA: Insufficient documentation

## 2010-09-30 DIAGNOSIS — M5030 Other cervical disc degeneration, unspecified cervical region: Secondary | ICD-10-CM | POA: Insufficient documentation

## 2010-09-30 DIAGNOSIS — M47812 Spondylosis without myelopathy or radiculopathy, cervical region: Secondary | ICD-10-CM | POA: Insufficient documentation

## 2010-09-30 DIAGNOSIS — G4733 Obstructive sleep apnea (adult) (pediatric): Secondary | ICD-10-CM | POA: Insufficient documentation

## 2010-09-30 DIAGNOSIS — E785 Hyperlipidemia, unspecified: Secondary | ICD-10-CM | POA: Insufficient documentation

## 2010-10-05 LAB — PROTIME-INR
INR: 0.91 (ref 0.00–1.49)
Prothrombin Time: 12.5 seconds (ref 11.6–15.2)

## 2010-10-05 LAB — CBC
MCH: 31.1 pg (ref 26.0–34.0)
RBC: 4.15 MIL/uL (ref 3.87–5.11)

## 2010-10-06 NOTE — Op Note (Signed)
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  PROCEDURE:  C5-6 and C6-7 anterior cervical decompression and arthrodesis with allograft and Tether cervical plating.  SURGEON:  Hosie Spangle, MD  ASSISTANT:  Ophelia Charter, MD  ANESTHESIA:  General endotracheal.  INDICATIONS:  The patient is a 40 year old woman who is about 4-1/2 years status post a single-level C4-5 anterior cervical decompression and arthrodesis with bone grafts and Tether cervical plating.  She did well from that surgery.  She has a solid fusion at that level; however, over the past 4-5 months she has developed pain in her neck with numbness and tingling in the upper extremities bilaterally.  X-ray showed degenerative disk disease and spondylosis at C3-4, C5-6, and C6- 7.  MRI scan shows large disk herniation to the left C5-6 and a large disk herniation to the right at C6-7.  The decision was made to proceed with C5-6 and C6-7 ACDF.  PROCEDURE:  The patient was brought to the operating room, placed on general endotracheal anesthesia.  The patient was placed in 10 pounds of halter traction.  The neck was prepped with Betadine soap and solution and draped in sterile fashion.  A horizontal incision was made on the left side of the neck.  The line of the incision was infiltrated with local anesthetic with  epinephrine.  Incision was made about a centimeter below the previous horizontal incision.  Dissection was carried down to the subcutaneous tissue and platysma.  Bipolar cautery and electrocautery were used to maintain hemostasis.  Dissection was carried down to the ventral aspect of the vertebral column through an avascular plane in the sternocleidomastoid, carotid artery, and jugular vein laterally and trachea and esophagus medially.  The previous plate inferior aspect was identified and we took an x-ray to confirm our localization at the C5-6 and C6-7 disk spaces.  The operating microscope was then draped and brought into the field to provide additional navigation, illumination, and visualization.  The remainder of the decompression was performed using microdissection and microsurgical technique.  Diskectomy was begun at each level with incision of the annulus continued with microcurettes and pituitary rongeurs.  Anterior osteophytic overgrowth was removed using Kerrison punches, the osteophyte removal tool, and high-speed drill.  The disks were spondylotic at each level.  We removed them using microcurettes and pituitary rongeurs and then we began to remove the cartilaginous endplates using microcurettes and the high-speed drill.  Decompression was continued posteriorly to the disk space, and with the operating microscopes, magnification, and visualization, we proceeded with removing posterior osteophytic overgrowth with the high-speed drill and then  the 2-mm Kerrison punch with thin footplate.  We then opened the posterior longitudinal ligament bilaterally.  Disk herniation to the right at C6-7 and disk herniation to the left at C5-6 were identified. These were carefully removed decompressing the spinal canal and thecal sac.  We then turned our attention to the neural foramen to ensure that the exiting C6 and C7 nerve roots were adequately decompressed as well and spondylitic  overgrowth extending into the neural foramen was carefully removed.  Once decompression was completed, hemostasis was established with the use of Gelfoam soaked in thrombin.  We measured the height of the intervertebral disk space and selected two 7-mm allograft implants.  They were hydrated in saline solution and positioned in the intervertebral disk space and countersunk.  We then packed Gelfoam with thrombin lateral to the implants at each side and then selected a 29-mm Tether cervical plate, it was positioned over the fusion construct, secured with a single 4- x 14-mm fixed screw at C6 and a pair of 4- x 14- mm variable screws at C5 and C7.  We were able to place the plate at C5 below the level of the previous plate and did not have to remove the previous plates.  Once all five screws were in place, final tightening was performed.  The wound was irrigated with bacitracin solution and checked for hemostasis which was established and confirmed and then we proceed with closure.  This was closed with interrupted inverted 2-0 undyed Vicryl sutures, subcutaneous and subcuticular were closed with interrupted inverted 3-0 undyed Vicryl sutures, skin edges were approximated with Dermabond.  The procedure was tolerated well.  The estimated blood loss was 50 mL.  Sponge and needle count correct. Following surgery, the patient is to be taken out of traction, reversed from the anesthetic, extubated, and transferred to the recovery room for further care.     Hosie Spangle, M.D.     RWN/MEDQ  D:  09/30/2010  T:  10/01/2010  Job:  [id number]  Electronically Signed by Jovita Gamma M.D. on 10/06/2010 10:33:29 AM

## 2010-10-08 LAB — URINALYSIS, ROUTINE W REFLEX MICROSCOPIC
Glucose, UA: NEGATIVE mg/dL
Protein, ur: >300 mg/dL — AB

## 2010-10-08 LAB — URINE CULTURE: Culture  Setup Time: 201108031933

## 2010-10-08 LAB — URINE MICROSCOPIC-ADD ON

## 2010-12-10 NOTE — Discharge Summary (Signed)
[facility]  Patient:    [name], [name] Visit Number: [id number] MRN: [id number]          Service Type: OBS Location: 4A O122 01 Attending Physician:  Jonnie Kind Dictated by:   Tania Ade, M.D. Admit Date:  11/02/2001 Discharge Date: 11/05/2001                             Discharge Summary  DISCHARGE DIAGNOSES: 1. Status post a repeat low transverse cesarean section. 2. Unremarkable postoperative course.  PROCEDURES:  Repeat cesarean section.  Please refer to the transcribed history and physical, the antepartum chart, and the operative note for details of admission to the hospital.  HOSPITAL COURSE:  The patient was admitted postoperatively.  She had an unremarkable course.  She tolerated clear liquids and then a regular diet. She had progression of bowel function with flatus.  Her abdominal exam was completely benign.  Her incision was clean, dry, and intact.  She remained afebrile through the postoperative course.  Her H&H on postoperative day #1 was 11.7 and 33 and postoperative day #3 was 11 and 30.7.  She ambulated without difficulty, voided without symptoms, and tolerated oral pain medicines.  DISPOSITION:  She was discharged to home on the morning of postoperative day #3 with the infant, who was doing well.  DISCHARGE MEDICATIONS:  Tylox and Motrin for pain.  DISCHARGE INSTRUCTIONS:  She was given instructions and precautions for return to the hospital and contacting our office.  She will be seen per routine in the office in four weeks. Dictated by:   Tania Ade, M.D. Attending Physician:  Jonnie Kind DD:  11/05/01 TD:  11/05/01 Job: [id number]

## 2010-12-10 NOTE — Op Note (Signed)
[name], Li               ACCOUNT NO.:  [id number]   MEDICAL RECORD NO.:  [id number]          PATIENT TYPE:  OIB   LOCATION:  3546                         FACILITY:  [facility]   PHYSICIAN:  Hosie Spangle, M.D.DATE OF BIRTH:  1971-04-30   DATE OF PROCEDURE:  05/08/2006  DATE OF DISCHARGE:                                 OPERATIVE REPORT   PREOPERATIVE DIAGNOSES:  C4-C5 cervical disk herniation, cervical  degenerative disk disease, and neck pain.   POSTOPERATIVE DIAGNOSES:  C4-C5 cervical disk herniation, cervical  degenerative disk disease, and neck pain.   PROCEDURE PERFORMED:  C4-C5 anterior cervical diskectomy and arthrodesis  with VG2 allograft and tethered cervical plating.   SURGEON:  Hosie Spangle, M.D.   ASSISTANT:  Luiz Ochoa and Sandria Bales, RN   ANESTHESIA:  General endotracheal.   INDICATIONS FOR PROCEDURE:  The patient is a 40 year old woman who presented  with C4-C5 cervical disk herniation with posterior neck and upper back pain  which extended down towards the shoulders bilaterally and associated with  headaches, as well.  The decision was made to proceed with single-level  anterior cervical diskectomy and arthrodesis.   DESCRIPTION OF PROCEDURE:  The patient was brought to the operating room and  placed under general endotracheal anesthesia.  The patient was placed in 10  pounds of Halter traction.  The neck was prepped with Betadine scrubbing  solution and draped in a sterile fashion.  A horizontal incision was made in  the left side of the neck.  The line of incision was infiltrated with local  anesthetic with epinephrine.  The incision was made and carried down through  the subcutaneous tissue and platysma with bipolar cautery; electrocautery  was used to maintain hemostasis.  Dissection was carried down through an  avascular plane in the sternocleidomastoid, carotid artery, and jugular vein  laterally and the trachea and esophagus medially.  The  ventral aspects of  the vertebral column were identified and localized, x-rays were taken and  the C4-C5 intravertebral disk space identified.  Diskectomy was begun with  an incision of the annulus and continued with microcurettes and pituitary  rongeurs.  Spondylitic overgrowth anteriorly and posteriorly was removed.  The cartilaginous endplates of the corresponding vertebrae were removed  using microcurettes and the XMax drill.  The microscope was draped and  brought into the field to provide additional magnification, illumination and  visualization, and the remainder of the decompression was performed using  microdissection and microsurgical technique.  Posterior osteophytic  overgrowth was removed using XMax drill and a 2-m Kerrison punch with a thin  footplate.  The posterior longitudinal ligament was carefully opened and the  disk herniation identified, located centrally.  This was removed and we were  able to decompress the spinal canal and thecal sac.  The decompression was  continued laterally to either side, and once the diskectomy was completed,  we established hemostasis with the use of Gelfoam soaked in thrombin.  We  did remove the Gelfoam, hemostasis was confirmed, and then we measured the  height of the intravertebral disk space and selected a 7-mm VG2  allograft.  It was hydrated in saline solution and positioned in the intravertebral disk  space and countersunk.  We then discontinued the cervical traction.  We  selected a 14-mm tethered cervical plate.  It was positioned over the fusion  construct and secured to each of the vertebrae with a pair of 4 x 13-mm  variable angle screws.  Each screw hole was drilled and tapped, the screws  placed in an alternating fashion.  Once all 4 screws were placed, final  tightening was performed, and then the wound was irrigated with bacitracin  solution, as had been done several times throughout the procedure.  Hemostasis was established  and confirmed, and then we proceeded with  closure.  The platysma was closed with interrupted inverted 2-0 Vicryl  sutures, the subcutaneous and subcuticular were closed with interrupted  inverted 3-0 Vicryl sutures, and the skin was approximated with Dermabond.  The procedure was tolerated well.   ESTIMATED BLOOD LOSS:  Less than 25 mL.   INSTRUMENT COUNTS:  Sponge counts were correct.   POSTOPERATIVE DISPOSITION:  Following the surgery, the patient is to be  awakened from the anesthetic, extubated, and transferred to the recovery  room for further care.      Hosie Spangle, M.D.  Electronically Signed     RWN/MEDQ  D:  05/08/2006  T:  05/08/2006  Job:  [id number]

## 2010-12-10 NOTE — H&P (Signed)
[name], [name]               ACCOUNT NO.:  [id number]   MEDICAL RECORD NO.:  [id number]           PATIENT TYPE:  AMB   LOCATION:  DAY                           FACILITY:  [facility]   PHYSICIAN:  Florian Buff, M.D.   DATE OF BIRTH:  1970/10/03   DATE OF ADMISSION:  DATE OF DISCHARGE:  LH                                HISTORY & PHYSICAL   HISTORY OF PRESENT ILLNESS:  [name] is a 40 year old white female, gravida  2, para 2, who uses birth control pills for birth control.  Her last  menstrual period was the end of May.  She is admitted for TAH/BSO.  The  patient suffers with dysmenorrhea, menometrorrhagia, dysfunction bleeding,  and a strong family history of a first degree relative with ovarian cancer.  We discussed options for management including hysteroscopy D&C, endometrial  ablation, and the patient, despite being on birth control pills, still has  these symptoms, and does want to have definitive therapy.  She is requesting  TAH/BSO; TAH for the bleeding, and the BSO because of the strong family  history.  She also complained of urine loss, and we did a urodynamic  testing, which revealed detrusor instability, but no stress incontinence.  As a result, she was put on Vesicare 5 mg to take at night, and will  probably increase her up to 10 mg postoperatively if needed.   PAST MEDICAL HISTORY:  Irritable bowel syndrome.   PAST SURGICAL HISTORY:  She had a one cesarean section and one vaginal  delivery.   REVIEW OF SYSTEMS:  Otherwise negative.   ALLERGIES:  None.   MEDICATIONS:  1.  Celexa.  2.  Multivitamins.  3.  Birth control pills.   PHYSICAL EXAMINATION:  VITAL SIGNS:  Her blood pressure is 120/80, and her  weight is 186 pounds.  HEENT:  Unremarkable.  Thyroid is normal.  LUNGS:  Clear.  HEART:  Regular rate and rhythm without regurgitation or gallop.  BREASTS:  Without mass, discharge, or skin changes.  ABDOMEN:  Benign.  No hepatosplenomegaly, no masses.  PELVIC:  She has normal external genitalia.  The vagina is pink and moist  without discharge.  Cervix is parous without lesions.  Uterus is normal  size, shape, and contour.  Ovaries normal, nontender.  EXTREMITIES:  warm no edema.  NEUROLOGIC:  Grossly intact.   IMPRESSION:  1.  Menometrorrhagia.  2.  Dysmenorrhea.  3.  Dysfunctional bleeding.  4.  Detrusor instability.  5.  History of first degree relative with ovarian cancer.   PLAN:  The patient is admitted for total abdominal hysterectomy/bilateral  salpingo-oophorectomy.  She understands the risks, benefits, indications,  and alternatives, and will proceed.       LHE/MEDQ  D:  01/11/2005  T:  01/11/2005  Job:  [id number]

## 2010-12-10 NOTE — Discharge Summary (Signed)
NAMESONORA, CATLIN               ACCOUNT NO.:  [id number]   MEDICAL RECORD NO.:  [id number]          PATIENT TYPE:  INP   LOCATION:  J856                          FACILITY:  [facility]   PHYSICIAN:  Florian Buff, M.D.   DATE OF BIRTH:  [birth year]   DATE OF ADMISSION:  01/16/2005  DATE OF DISCHARGE:  06/28/2006LH                                 DISCHARGE SUMMARY   DISCHARGE DIAGNOSES:  1.  Status post total abdominal hysterectomy bilateral salpingo-      oophorectomy.  2.  Postoperative ileus.  3.  History of irritable bowel syndrome.  4.  Questionable wound cellulitis which turned out to be negative.   HISTORY OF PRESENT ILLNESS:  Please refer to the history and physical and  the previous history and physical and operative note for details of  admission to the hospital.   HOSPITAL COURSE:  The patient was admitted saying she had had a fever of  100.5 at home with shaking chills and that her incision was looking worse.  She came in through the ER and was admitted to the hospital.  She had a  white count of 10,100 with no significant shift.  She was started on  doxycycline IV as well as Cleocin.  I stopped the Cleocin and put her on  Bactrim and stopped the doxycycline as well.  When I saw her the morning  after she came in through the ER she really had no cellulitis, no evidence  of any wound infection at all, it was just a normal looking incision.  She  has been afebrile in the hospital the entire time.  Her highest temperature  has been 98.7 and her white count went down to 9100 again with unremarkable  shift.  She did have hypoactive bowel sounds and was bloated.  I  followed  an exam and did abdominal series and she had obstipation and she did offer  that she has a history of irritable bowel predominantly of constipation and  she has also been nauseated.  She was tolerating everything orally when she  was in the hospital last week and she was doing not as well at home.  We  gave  her a couple of large enemas as well as discontinuing all IV pain  medicine and bowel sounds became better and her bloating became less.   DISPOSITION:  As a result she was discharged home the morning of January 19, 2005 on Bactrim to take for five more days twice a day, Phenergan for nausea  and MiraLax powder.  She has Tylox at home for pain.  Her staples were  removed and Steri-Strips were placed.  Her incision is clean, dry and  intact.  No cellulitis, no evidence of any infection at all and she will be  seen back in the office in one week.       LHE/MEDQ  D:  01/19/2005  T:  01/19/2005  Job:  [id number]

## 2010-12-10 NOTE — Op Note (Signed)
[name], KOCHEL               ACCOUNT NO.:  [id number]   MEDICAL RECORD NO.:  [id number]          PATIENT TYPE:  INP   LOCATION:  V785                          FACILITY:  [facility]   PHYSICIAN:  Florian Buff, M.D.   DATE OF BIRTH:  12-11-1970   DATE OF PROCEDURE:  01/12/2005  DATE OF DISCHARGE:  01/19/2005                                 OPERATIVE REPORT   PREOPERATIVE DIAGNOSIS:  1.  Menometrorrhagia.  2.  Dysmenorrhea.  3.  Urge incontinence.   POSTOPERATIVE DIAGNOSIS:  1.  Menometrorrhagia.  2.  Dysmenorrhea.  3.  Urge incontinence.   PROCEDURE:  Total abdominal hysterectomy, bilateral salpingo-oophorectomy.   SURGEON:  Dr. Elonda Husky.   ANESTHESIA:  General endotracheal.   FINDINGS:  The patient in the office was found to have heavy vaginal  bleeding and also had bilateral pelvic pain. We discussed options including  hysteroscopy D&C endometrial ablation, but since she was having bilateral  pain and pain at other times other than her menses, we decided to proceed  with TAH and BSO. There were no abnormal findings at the time of surgery.   DESCRIPTION OF PROCEDURE:  The patient was taken to the operating room and  placed in a supine position where she underwent general endotracheal  anesthesia. The vagina was prepped. A Foley catheter was placed. The abdomen  was prepped and draped in the usual sterile fashion. A Pfannenstiel skin  incision was made and carried down sharply to the rectus fascia which was  scored in the midline and extended laterally. The fascia was taken off of  the muscles superiorly and inferiorly without difficulty. The muscles were  divided. Peritoneal cavity was entered. A self-retaining retractor was  placed. The upper abdomen was packed away. The uterine cornu were grasped.  The left round ligament was sutured ligated and cut. The peritoneum was  incised. The infundibulopelvic ligament on the left was clamped, cut, and  double suture ligated. The  right round ligated was suture ligated and cut.  The right infundibulopelvic ligament was isolated, clamped, cut, and double  suture ligated. Pelvic peritoneum was incised also on the right, and the  bladder was pushed off the lower uterine segment. Uterine vessels were  clamped, cut, and transfixed and suture ligated. Serial pedicles were taken  down to the cervix through the cardinal ligament, each pedicle being  clamped, cut, and suture ligated. The vaginal angles were encountered. They  were clamped, cut, and the specimen was removed. Vaginal angle sutures were  placed, and the vagina was closed with interrupted figure-of-eight sutures.  Specimen was sent to the lab. Pelvis was irrigated vigorously. There was  good hemostasis of all pedicles. Interceed was placed over the vaginal cuff  to prevent adhesions. The protractor and the packs were removed. The muscles  were reapproximated loosely. The fascia was closed using 0 Vicryl running.  Subcutaneous  tissue was made hemostatic and irrigated. Skin was closed using skin  staples. The patient tolerated the procedure well. She tolerated 250 cc of  blood loss, taken to the recovery room in good and stable condition.  All  counts were correct x3. She received Ancef prophylactically.       LHE/MEDQ  D:  02/23/2005  T:  02/23/2005  Job:  [id number]

## 2010-12-10 NOTE — H&P (Signed)
[name], BURGES               ACCOUNT NO.:  [id number]   MEDICAL RECORD NO.:  [id number]          PATIENT TYPE:  INP   LOCATION:  C003                          FACILITY:  [facility]   PHYSICIAN:  Florian Buff, M.D.   DATE OF BIRTH:  [birth year]   DATE OF ADMISSION:  01/16/2005  DATE OF DISCHARGE:  LH                                HISTORY & PHYSICAL   INTERVAL HISTORY AND PHYSICAL (01/17/05)   Please see the history and physical which corresponded to the patient's  surgery admission on 01/13/05.   Sabrinna is re-admitted last night through the ER.  She is now postoperative  day #5, TAH/BSO who came back to the ER after having talked with Dr.  Glo Herring complaining of nausea, vomiting, and temperature of 100.5.  When  she left the hospital on Saturday, or postoperative day #3, she was noted to  have a small area of cellulitis above the incision on the left.  Dr.  Glo Herring put her on Levaquin, but since that time she has had difficulty  keeping down liquids.  She said she documented temperature of 100.5.   On examination this morning, her area that Dr.  Glo Herring marked is barely  noticeable.  It could have even been an area of bruising, but in any event,  we have her on doxycycline and Bactrim just to cover her for possibility of  MRSA which has been somewhat of a difficult occurrence recently.  Otherwise,  her white count is fine.  It is 9500 with an unremarkable shift.  There is  no left shift.  H&H is good at 12 and 36.  Her abdominal exam reveals  positive bowel sounds, slightly hypoactive.  It is soft and mildly distended  consistent with postoperative state.  Pelvic exam is deferred.   IMPRESSION:  1.  Mild postoperative ileus.  2. No evidence of significant wound      complication.   PLAN:  The patient is getting IV fluid, bowel rest, clear liquids.  She is  getting an acute abdominal series this morning to evaluate the status of her  bowels, and we will give her Dulcolax  suppository to try to stimulate  peristalsis.  It is of note, the patient had been on narcotics prior to  surgery because of pelvic pain and that could be related to some of the slow  recovery of bowel function.  At this time, I am  unimpressed with any wound  issues.  There certainly does not appear to be any MRSA and maybe a very  mild cellulitis but certainly nothing significant.  I probably would not  even go so far as to call it that at this point.  Otherwise, her incision is  clean, dry, and intact with no evidence of hematoma or wound abscess.  We  will continue her on Bactrim and doxycycline.  We will recheck her CBC in  the morning as well.      LHE/MEDQ  D:  01/17/2005  T:  01/17/2005  Job:  [id number]

## 2010-12-10 NOTE — Discharge Summary (Signed)
[name], [name]               ACCOUNT NO.:  [id number]   MEDICAL RECORD NO.:  [id number]          PATIENT TYPE:  INP   LOCATION:  A417                          FACILITY:  [facility]   PHYSICIAN:  Florian Buff, M.D.   DATE OF BIRTH:  [birth year]   DATE OF ADMISSION:  01/12/2005  DATE OF DISCHARGE:  06/24/2006LH                                 DISCHARGE SUMMARY   DISCHARGE DIAGNOSES:  1.  Status post TAH BSO.  2.  Unremarkable postoperative course.  3.  Routine postoperative care.   Please refer to the transcribed History and Physical and Op Note for details  of admission to the hospital.   HOSPITAL COURSE:  The patient was admitted after surgery.  Her postoperative  course was unremarkable except for significant pain which we assumed would  happen because she used non-narcotics at home.  Her hemoglobin and  hematocrit were 12.4 and 35.9, postop white count of 16,500 on postop day  #1.  Her postop day #3, hemoglobin and hematocrit were still stable at 11.0  and 32 with a white count of 10,000.  She remained afebrile, stable vital  signs.  Tolerated clear liquids and regular diet.  She voided without  symptoms and was extensively ambulatory.  She was discharged to home on January 15, 2005, by Dr. Glo Herring on Levaquin and Tylox for pain.  He was given  instructions to follow up in the office the following Wednesday to have her  staples removed and Steri-Strips placed.       LHE/MEDQ  D:  02/23/2005  T:  02/23/2005  Job:  [id number]

## 2010-12-10 NOTE — Op Note (Signed)
[facility]  Patient:    [name], [name] Visit Number: [id number] MRN: [id number]          Service Type: OBS Location: [facility] [facility] Attending Physician:  Jonnie Kind Dictated by:   Mallory Shirk, M.D. Proc. Date: 11/02/01 Admit Date:  11/02/2001   CC:         [name], M.D.   Operative Report  PREOPERATIVE DIAGNOSES:  Pregnancy 38 1/[redacted] weeks gestation, repeat cesarean section not for trial of labor. Jehovah Witness refusing consideration of blood products.  POSTOPERATIVE DIAGNOSES:  Pregnancy 38 1/[redacted] weeks gestation, repeat cesarean section not for trial of labor. Jehovah Witness refusing consideration of blood products.  PROCEDURE:  Repeat low transverse cervical cesarean section.  SURGEON:  Mallory Shirk, M.D.  ASSISTANT:  Jenne Pane, C.F.A.  ANESTHESIA:  Spinal, Cletus Gash C.R.N.A.  COMPLICATIONS:  None.  FINDINGS:  Thick lower uterine segment, healthy female infant. Apgars 8 and 9, clear amniotic fluid without malodor.  DESCRIPTION OF PROCEDURE:  The patient was taken to the operating room, prepped and draped for a lower abdominal surgical Pfannenstiel type was repeated, the small cicatrix was trimmed and discarded. The peritoneal entry was without difficulty. The bladder flap was very high on the anterior abdominal wall but was not entered during the laparotomy access to the abdomen. Bladder flap was developed easily on the lower uterine segment, transverse uterine incision performed and extended down to the ______ waters where the incision was extended laterally using index finger traction.  Amniotic fluid sac was ruptured, fetal vertex guided through the incision using vacuum extractor guidance with fundal pressure as the primary propulsive force. The cord was clamped and the infant passed to the waiting pediatrician. Cord blood samples were obtained. The placenta then delivered easily [facility] presentation and then the uterus  irrigated with antibiotic solution. There were no membranes remnants encountered. A single layer of running closure of the uterine incision resulted in good tissue approximation and hemostasis. The bladder flap was reapproximated with 2-0 chromic.  Irrigation of the abdomen again with antibiotic solution was followed by 2-0 chromic closure of the peritoneal cavity, #0 Vicryl closure of the fascia, 2-0 plain reapproximation of subcu fatty tissue and staple closure of the skin. The patient tolerated the procedure well and went to the recovery room in good condition. Dictated by:   Mallory Shirk, M.D. Attending Physician:  Jonnie Kind DD:  11/02/01 TD:  11/03/01 Job: [id number]

## 2012-02-07 IMAGING — CR DG CERVICAL SPINE 2 OR 3 VIEWS
1 series · 1 of 1 positions shown · non-contrast
Comparison: None.

CLINICAL DATA: C5-6 and C6-7 disc herniation.

CERVICAL SPINE - 2-3 VIEW

[view not recorded]
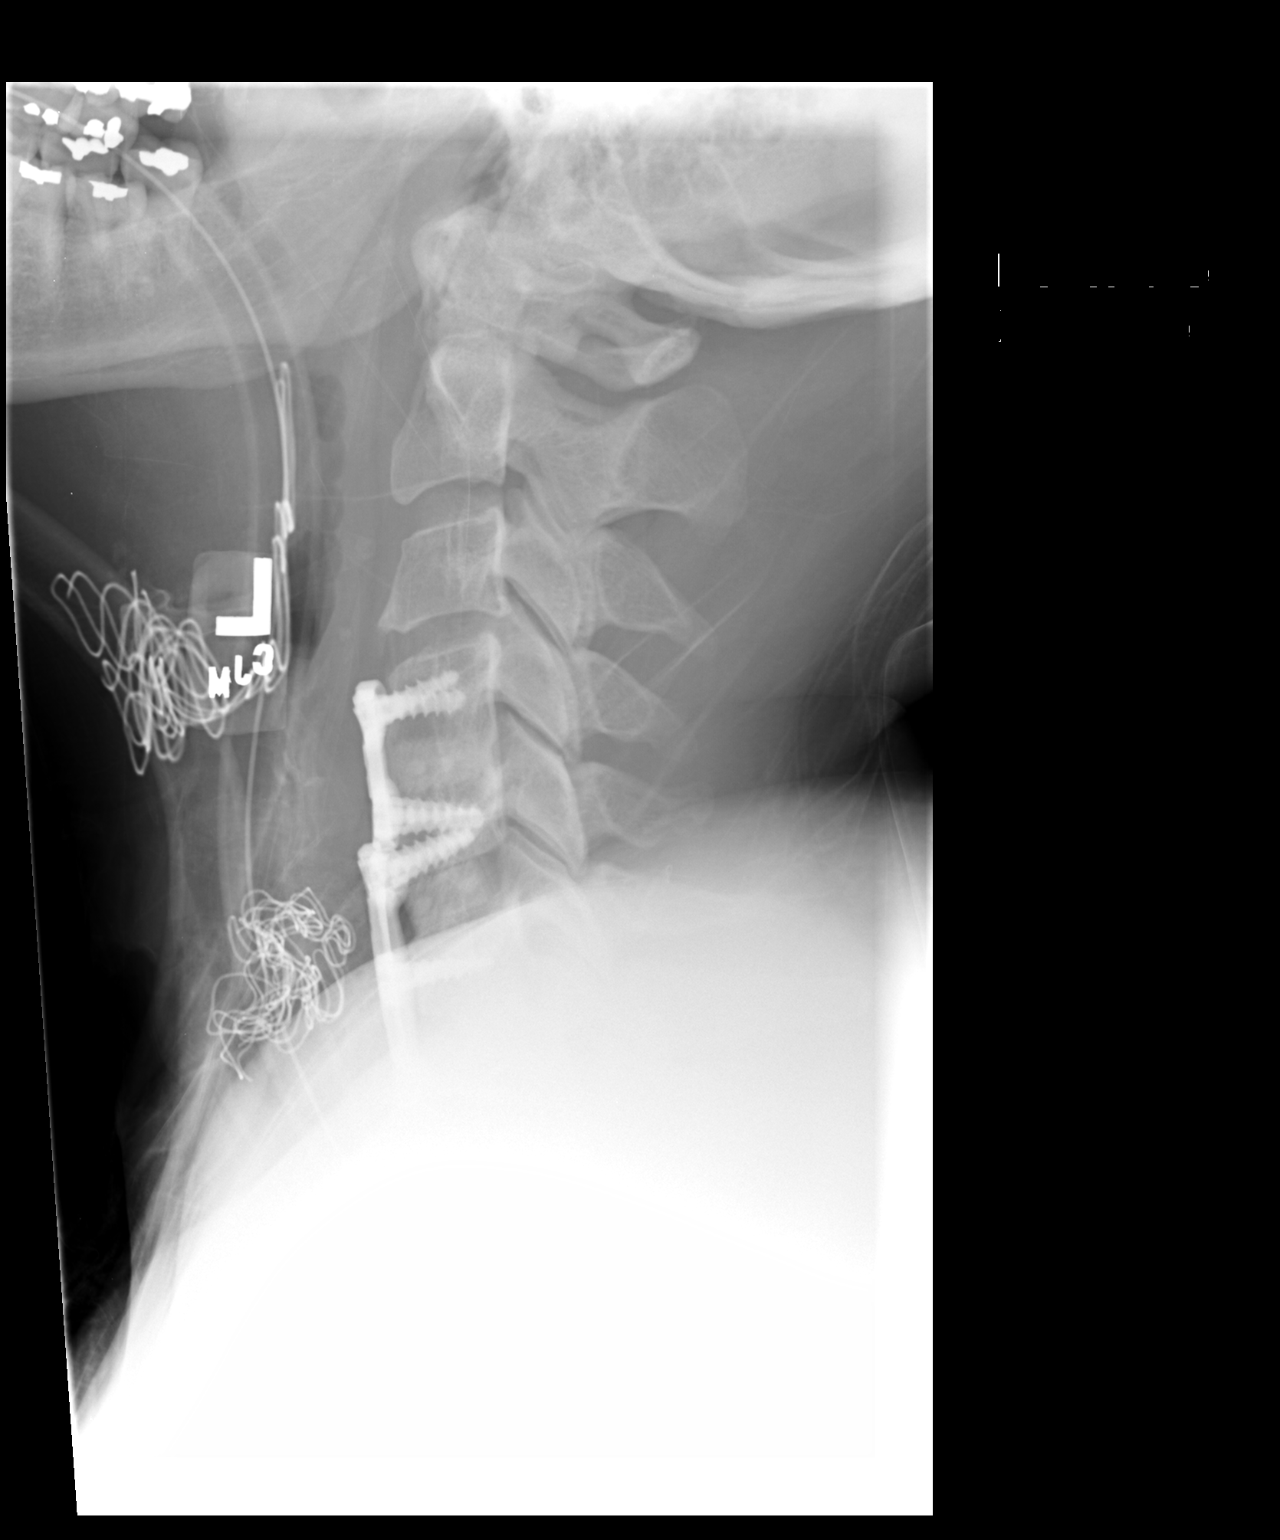

[1 of 1 positions shown; findings below may reference images not displayed]

FINDINGS: Intraoperative cross-table lateral radiograph #1 shows an
old anterior cervical spine fusion at C4-5.  The lungs are seen
anteriorly overlying the intervertebral disc spaces at the levels
of the C5-6 and C6-7.

Intraoperative cross-table lateral radiograph #2 demonstrates
placement of the anterior cervical fusion hardware and interbody
fusion plugs at levels of C5-6 and C6-7, with normal alignment.
IMPRESSION: Anterior cervical spine fusion at C5-6 and C6-7 with normal
alignment.  Old anterior fusion at C4-5 noted.

## 2012-08-06 ENCOUNTER — Other Ambulatory Visit: Payer: Self-pay | Admitting: Neurosurgery

## 2012-08-06 DIAGNOSIS — M5030 Other cervical disc degeneration, unspecified cervical region: Secondary | ICD-10-CM

## 2012-08-06 DIAGNOSIS — M5020 Other cervical disc displacement, unspecified cervical region: Secondary | ICD-10-CM

## 2012-08-06 DIAGNOSIS — M47812 Spondylosis without myelopathy or radiculopathy, cervical region: Secondary | ICD-10-CM

## 2012-08-06 DIAGNOSIS — M5412 Radiculopathy, cervical region: Secondary | ICD-10-CM

## 2012-08-08 ENCOUNTER — Ambulatory Visit
Admission: RE | Admit: 2012-08-08 | Discharge: 2012-08-08 | Disposition: A | Discharge status: Home / Self Care | Payer: BC Managed Care – PPO | Source: Ambulatory Visit | Attending: Neurosurgery | Admitting: Neurosurgery

## 2012-08-08 DIAGNOSIS — M5020 Other cervical disc displacement, unspecified cervical region: Secondary | ICD-10-CM

## 2012-08-08 DIAGNOSIS — M47812 Spondylosis without myelopathy or radiculopathy, cervical region: Secondary | ICD-10-CM

## 2012-08-08 DIAGNOSIS — M5030 Other cervical disc degeneration, unspecified cervical region: Secondary | ICD-10-CM

## 2012-08-08 DIAGNOSIS — M5412 Radiculopathy, cervical region: Secondary | ICD-10-CM

## 2012-08-17 ENCOUNTER — Other Ambulatory Visit: Payer: Self-pay | Admitting: Neurosurgery

## 2012-08-17 DIAGNOSIS — M5030 Other cervical disc degeneration, unspecified cervical region: Secondary | ICD-10-CM

## 2012-08-17 DIAGNOSIS — M47812 Spondylosis without myelopathy or radiculopathy, cervical region: Secondary | ICD-10-CM

## 2012-08-17 DIAGNOSIS — M5020 Other cervical disc displacement, unspecified cervical region: Secondary | ICD-10-CM

## 2012-08-22 ENCOUNTER — Ambulatory Visit
Admission: RE | Admit: 2012-08-22 | Discharge: 2012-08-22 | Disposition: A | Discharge status: Home / Self Care | Payer: BC Managed Care – PPO | Source: Ambulatory Visit | Attending: Neurosurgery | Admitting: Neurosurgery

## 2012-08-22 DIAGNOSIS — M47812 Spondylosis without myelopathy or radiculopathy, cervical region: Secondary | ICD-10-CM

## 2012-08-22 DIAGNOSIS — M5030 Other cervical disc degeneration, unspecified cervical region: Secondary | ICD-10-CM

## 2012-08-22 DIAGNOSIS — M5020 Other cervical disc displacement, unspecified cervical region: Secondary | ICD-10-CM

## 2012-08-23 ENCOUNTER — Other Ambulatory Visit: Payer: BC Managed Care – PPO

## 2012-09-15 ENCOUNTER — Encounter: Payer: Self-pay | Admitting: Diagnostic Neuroimaging

## 2012-10-03 ENCOUNTER — Other Ambulatory Visit: Payer: Self-pay | Admitting: Neurosurgery

## 2012-10-16 ENCOUNTER — Telehealth: Payer: Self-pay | Admitting: Diagnostic Neuroimaging

## 2012-10-16 ENCOUNTER — Ambulatory Visit: Payer: Self-pay | Admitting: Diagnostic Neuroimaging

## 2012-10-16 NOTE — Telephone Encounter (Signed)
Pt requested to cancel 10/16/12 appt. Will c/b to reschedule.

## 2012-10-17 ENCOUNTER — Other Ambulatory Visit: Payer: Self-pay | Admitting: *Deleted

## 2012-10-17 ENCOUNTER — Other Ambulatory Visit (HOSPITAL_COMMUNITY): Payer: Self-pay | Admitting: Family Medicine

## 2012-10-17 MED ORDER — HYDROCHLOROTHIAZIDE 25 MG PO TABS: 25.0000 mg | ORAL_TABLET | Freq: Every day | ORAL | Status: DC

## 2012-10-17 MED ORDER — LISINOPRIL 5 MG PO TABS: 5.0000 mg | ORAL_TABLET | Freq: Every day | ORAL | Status: DC

## 2012-10-17 MED ORDER — CITALOPRAM HYDROBROMIDE 40 MG PO TABS: 40.0000 mg | ORAL_TABLET | Freq: Every day | ORAL | Status: DC

## 2012-10-18 ENCOUNTER — Other Ambulatory Visit: Payer: Self-pay

## 2012-10-18 MED ORDER — CLONAZEPAM 1 MG PO TABS: ORAL_TABLET | ORAL | Status: DC

## 2012-10-18 NOTE — Telephone Encounter (Signed)
Nurses she may have 4 refills on all. I will hand write klonopin. [employer] says to [employer] and shred but fax in the hand written.

## 2012-10-18 NOTE — Addendum Note (Signed)
Addended by: [name] A on: 10/18/2012 11:43 AM   Modules accepted: Orders

## 2012-11-07 ENCOUNTER — Encounter: Payer: Self-pay | Admitting: Family Medicine

## 2012-11-07 ENCOUNTER — Ambulatory Visit (INDEPENDENT_AMBULATORY_CARE_PROVIDER_SITE_OTHER): Payer: BC Managed Care – PPO | Admitting: Family Medicine

## 2012-11-07 VITALS — BP 118/72 | Temp 97.9°F | Wt 177.6 lb

## 2012-11-07 DIAGNOSIS — K7689 Other specified diseases of liver: Secondary | ICD-10-CM

## 2012-11-07 DIAGNOSIS — R7309 Other abnormal glucose: Secondary | ICD-10-CM

## 2012-11-07 DIAGNOSIS — R5381 Other malaise: Secondary | ICD-10-CM | POA: Insufficient documentation

## 2012-11-07 DIAGNOSIS — I10 Essential (primary) hypertension: Secondary | ICD-10-CM

## 2012-11-07 DIAGNOSIS — E785 Hyperlipidemia, unspecified: Secondary | ICD-10-CM

## 2012-11-07 DIAGNOSIS — R739 Hyperglycemia, unspecified: Secondary | ICD-10-CM

## 2012-11-07 DIAGNOSIS — R5383 Other fatigue: Secondary | ICD-10-CM

## 2012-11-07 LAB — BASIC METABOLIC PANEL
CO2: 26 mEq/L (ref 19–32)
Calcium: 9.9 mg/dL (ref 8.4–10.5)
Creat: 1.03 mg/dL (ref 0.50–1.10)
Glucose, Bld: 68 mg/dL — ABNORMAL LOW (ref 70–99)

## 2012-11-07 LAB — HEPATIC FUNCTION PANEL
Albumin: 3.8 g/dL (ref 3.5–5.2)
Alkaline Phosphatase: 68 U/L (ref 39–117)
Total Bilirubin: 0.3 mg/dL (ref 0.3–1.2)

## 2012-11-07 LAB — LIPID PANEL

## 2012-11-07 MED ORDER — CLINDAMYCIN HCL 300 MG PO CAPS: 300.0000 mg | ORAL_CAPSULE | Freq: Three times a day (TID) | ORAL | Status: DC

## 2012-11-07 MED ORDER — FLUTICASONE PROPIONATE 50 MCG/ACT NA SUSP: 2.0000 | Freq: Every day | NASAL | Status: DC

## 2012-11-07 NOTE — Progress Notes (Signed)
  Subjective:    Patient ID: [name], female    DOB: 01/08/1971, 42 y.o.   MRN: [id number]  Sinus Problem This is a recurrent problem. The current episode started 1 to 4 weeks ago. There has been no fever. Associated symptoms include chills, congestion, coughing, ear pain, headaches, a hoarse voice, sinus pressure, a sore throat and swollen glands. Past treatments include antibiotics. The treatment provided moderate relief.      Review of Systems  Constitutional: Positive for chills.  HENT: Positive for ear pain, congestion, sore throat, hoarse voice and sinus pressure.   Respiratory: Positive for cough.   Neurological: Positive for headaches.       Objective:   Physical Exam        Assessment & Plan:

## 2012-11-07 NOTE — Patient Instructions (Addendum)
Phillip's colon health probiotic - take 1 daily  Use loratadine or allegra store brand daily, also flonase for next 6 weeks  Clindamycin 3 times a day for 10 days  Do your labs

## 2012-11-07 NOTE — Progress Notes (Signed)
  Subjective:    Patient ID: [name], female    DOB: [birth year], 42 y.o.   MRN: [id number]  HPI This patient comes in with several different things going on. She accidentally cut herself approximately 7-10 days ago she needs a staple removed from her leg. She also has had several weeks of head congestion sinus pressure postnasal drainage coughing not feeling good she's been to the urgent care Center several times they tried Keflex, Augmentin, Rocephin, steroids nothing is seen to help. She does have history of allergies in the past she is not taking anything currently for that. She denies any wheezing she does relate low-grade fever she does relate some fatigue and tiredness  The patient also states he's had severe fatigue and tiredness for the past several weeks she thinks is associated with a sinus infection but she also has multiple medical issues and she wonders if some of those could be causing it she has had problems in the past with liver enzymes being elevated with hyperlipidemia with hypertension and sleep apnea she does use her CPAP machine she thinks it does help.She denies being depressed. Denies any flareup of her reflux.   Review of Systems  Constitutional: Positive for fatigue. Negative for activity change and appetite change.  HENT: Positive for congestion and rhinorrhea. Negative for neck pain and ear discharge.   Eyes: Negative for discharge.  Respiratory: Positive for cough. Negative for chest tightness and wheezing.   Cardiovascular: Negative for chest pain.  Gastrointestinal: Negative for vomiting and abdominal pain.  Allergic/Immunologic: Negative for environmental allergies and food allergies.  Neurological: Negative for weakness and headaches.  Psychiatric/Behavioral: Negative for behavioral problems and agitation.       Objective:   Physical Exam  Vitals reviewed. Constitutional: She is oriented to person, place, and time. She appears well-developed and  well-nourished.  HENT:  Head: Normocephalic.  Right Ear: External ear normal.  Left Ear: External ear normal.  Neck: Normal range of motion. No thyromegaly present.  Cardiovascular: Normal rate, regular rhythm, normal heart sounds and intact distal pulses.   No murmur heard. Pulmonary/Chest: Effort normal and breath sounds normal. No respiratory distress. She has no wheezes.  Abdominal: Soft. Bowel sounds are normal. She exhibits no distension and no mass. There is no tenderness.  Musculoskeletal: Normal range of motion. She exhibits no edema and no tenderness.  Lymphadenopathy:    She has no cervical adenopathy.  Neurological: She is alert and oriented to person, place, and time. She exhibits normal muscle tone.  Skin: Skin is warm and dry.  Psychiatric: She has a normal mood and affect. Her behavior is normal.          Assessment & Plan:  #1 sinusitis-clindamycin 300 mg 3 times a day for 10 days, take a probiotic every single day, if persistent symptoms notify us. #2 stable removal-this was removed without difficulty. Band-Aid was placed. No sign of infection. #3 severe fatigue-I. Think is probably related to #1 but because of her history of fatty liver disease and also hyperglycemia we will be checking some lab work and seeing how that shows as well. 25 minutes spent with patient covering these multiple issues. Await testing.

## 2012-11-08 LAB — CBC WITH DIFFERENTIAL/PLATELET
Eosinophils Relative: 3 % (ref 0–5)
HCT: 38.3 % (ref 36.0–46.0)
Lymphocytes Relative: 45 % (ref 12–46)
Lymphs Abs: 3.9 10*3/uL (ref 0.7–4.0)
MCV: 92.7 fL (ref 78.0–100.0)
Monocytes Absolute: 0.6 10*3/uL (ref 0.1–1.0)
Neutro Abs: 3.9 10*3/uL (ref 1.7–7.7)
Platelets: 348 10*3/uL (ref 150–400)
RBC: 4.13 MIL/uL (ref 3.87–5.11)
WBC: 8.7 10*3/uL (ref 4.0–10.5)

## 2012-11-08 LAB — HEMOGLOBIN A1C
Hgb A1c MFr Bld: 5.5 %
Mean Plasma Glucose: 111 mg/dL

## 2012-11-15 ENCOUNTER — Other Ambulatory Visit: Payer: Self-pay | Admitting: *Deleted

## 2012-11-15 MED ORDER — FENOFIBRATE 160 MG PO TABS: 160.0000 mg | ORAL_TABLET | Freq: Every day | ORAL | Status: DC

## 2012-11-21 ENCOUNTER — Other Ambulatory Visit: Payer: Self-pay | Admitting: Family Medicine

## 2012-11-21 NOTE — Telephone Encounter (Signed)
Okay for 6 refills each

## 2012-11-21 NOTE — Telephone Encounter (Signed)
May refill all times 6

## 2012-11-23 ENCOUNTER — Encounter: Payer: Self-pay | Admitting: *Deleted

## 2012-12-19 ENCOUNTER — Other Ambulatory Visit: Payer: Self-pay | Admitting: Family Medicine

## 2012-12-19 ENCOUNTER — Other Ambulatory Visit: Payer: Self-pay | Admitting: *Deleted

## 2012-12-19 MED ORDER — CITALOPRAM HYDROBROMIDE 40 MG PO TABS: 40.0000 mg | ORAL_TABLET | Freq: Every day | ORAL | Status: DC

## 2012-12-19 MED ORDER — ACYCLOVIR 400 MG PO TABS: 400.0000 mg | ORAL_TABLET | Freq: Every day | ORAL | Status: DC

## 2012-12-19 MED ORDER — ACYCLOVIR 400 MG PO TABS: 400.0000 mg | ORAL_TABLET | Freq: Two times a day (BID) | ORAL | Status: DC

## 2012-12-19 MED ORDER — HYDROCHLOROTHIAZIDE 25 MG PO TABS: 25.0000 mg | ORAL_TABLET | Freq: Every day | ORAL | Status: DC

## 2012-12-19 NOTE — Telephone Encounter (Signed)
Medications sent to pharmacy

## 2012-12-19 NOTE — Telephone Encounter (Signed)
Pt seen in office for chronic visit 10/31/11.

## 2012-12-24 ENCOUNTER — Other Ambulatory Visit: Payer: Self-pay | Admitting: Family Medicine

## 2012-12-24 ENCOUNTER — Other Ambulatory Visit (HOSPITAL_COMMUNITY): Payer: BC Managed Care – PPO

## 2012-12-29 DIAGNOSIS — J4520 Mild intermittent asthma, uncomplicated: Secondary | ICD-10-CM | POA: Insufficient documentation

## 2012-12-31 ENCOUNTER — Encounter (HOSPITAL_COMMUNITY)
Admission: RE | Admit: 2012-12-31 | Discharge: 2012-12-31 | Disposition: A | Discharge status: Home / Self Care | Payer: BC Managed Care – PPO | Source: Ambulatory Visit | Attending: Neurosurgery | Admitting: Neurosurgery

## 2012-12-31 ENCOUNTER — Ambulatory Visit (HOSPITAL_COMMUNITY)
Admission: RE | Admit: 2012-12-31 | Discharge: 2012-12-31 | Disposition: A | Discharge status: Home / Self Care | Payer: BC Managed Care – PPO | Source: Ambulatory Visit | Attending: Anesthesiology | Admitting: Anesthesiology

## 2012-12-31 ENCOUNTER — Encounter (HOSPITAL_COMMUNITY): Payer: Self-pay

## 2012-12-31 DIAGNOSIS — Z01818 Encounter for other preprocedural examination: Secondary | ICD-10-CM | POA: Insufficient documentation

## 2012-12-31 DIAGNOSIS — Z01810 Encounter for preprocedural cardiovascular examination: Secondary | ICD-10-CM | POA: Insufficient documentation

## 2012-12-31 DIAGNOSIS — I10 Essential (primary) hypertension: Secondary | ICD-10-CM | POA: Insufficient documentation

## 2012-12-31 DIAGNOSIS — K219 Gastro-esophageal reflux disease without esophagitis: Secondary | ICD-10-CM | POA: Insufficient documentation

## 2012-12-31 DIAGNOSIS — G4733 Obstructive sleep apnea (adult) (pediatric): Secondary | ICD-10-CM | POA: Insufficient documentation

## 2012-12-31 DIAGNOSIS — Z01812 Encounter for preprocedural laboratory examination: Secondary | ICD-10-CM | POA: Insufficient documentation

## 2012-12-31 HISTORY — DX: Anemia, unspecified: D64.9

## 2012-12-31 HISTORY — DX: Unspecified asthma, uncomplicated: J45.909

## 2012-12-31 LAB — CBC
HCT: 38.5 % (ref 36.0–46.0)
Hemoglobin: 13.2 g/dL (ref 12.0–15.0)
MCH: 31.7 pg (ref 26.0–34.0)
MCHC: 34.3 g/dL (ref 30.0–36.0)
RBC: 4.17 MIL/uL (ref 3.87–5.11)

## 2012-12-31 LAB — BASIC METABOLIC PANEL
BUN: 20 mg/dL (ref 6–23)
Chloride: 106 mEq/L (ref 96–112)
GFR calc Af Amer: 87 mL/min — ABNORMAL LOW (ref >90)
Glucose, Bld: 86 mg/dL (ref 70–99)
Potassium: 4.5 mEq/L (ref 3.5–5.1)
Sodium: 139 mEq/L (ref 135–145)

## 2012-12-31 LAB — NO BLOOD PRODUCTS

## 2012-12-31 NOTE — Pre-Procedure Instructions (Addendum)
[name]  12/31/2012   Your procedure is scheduled on: 01/07/13  Report to [facility] at 530 AM.  Call this number if you have problems the morning of surgery: [phone]   Remember:   Do not eat food or drink liquids after midnight.   Take these medicines the morning of surgery with A SIP OF WATER:acyclovir, symbicort, celexa, , clonazepam premarin, singulair Do not wear lotions, powders, or perfumes. You may wear deodorant.  Do not shave 48 hours prior to surgery. Men may shave face and neck.  Do not bring valuables to the hospital.  [facility] is not responsible                   for any belongings or valuables.  Contacts, dentures or bridgework may not be worn into surgery.  Leave suitcase in the car. After surgery it may be brought to your room.  For patients admitted to the hospital, checkout time11:00 AM the day of  discharge.   Patients discharged the day of surgery will not be allowed to drive  home.  Name and phone number of your driver:  Special Instructions: Shower using CHG 2 nights before surgery and the night before surgery.  If you shower the day of surgery use CHG.  Use special wash - you have one bottle of CHG for all showers.  You should use approximately 1/3 of the bottle for each shower.   Please read over the following fact sheets that you were given: Pain Booklet, Coughing and Deep Breathing, MRSA Information and Surgical Site Infection Prevention

## 2013-01-01 NOTE — Progress Notes (Signed)
Anesthesia Chart Review:  Patient is a 42 year old female scheduled for C5-6, C6-7 posterior cervical arthrodesis on 01/07/13 by Dr. Sherwood Gambler.  History includes non-smoker, IBS, obstructive sleep apnea, GERD, hypertension, hypercholesterolemia, overactive bladder, anxiety, migraines, reactive airway disease, kidney stones, anemia, fatty liver, cervical disc surgery, hysterectomy, breast surgery. PCP is Dr. [name].  EKG on 12/31/12 showed NSR, cannot rule out anterior infarct (age undetermined).  Overall, I think it appears stable since 12/03/09.  She was seen by cardiologist Dr. Dorris Carnes in 2011 for evaluation of chest pain and had a normal stress echo on 02/02/10.  Negative CXR on 12/31/12.  Preoperative labs noted.  CBC WNL. She has requested no blood or blood products be given.  (LFTs on 11/07/12 were WNL, so I will not plan to repeat preoperatively.)  Anticipate that she can proceed as planned.  George Hugh [facility] Short Stay Center/Anesthesiology Phone [phone] 01/01/2013 5:51 PM

## 2013-01-07 ENCOUNTER — Ambulatory Visit (HOSPITAL_COMMUNITY)
Admission: RE | Admit: 2013-01-07 | Discharge: 2013-01-10 | Disposition: A | Discharge status: Home / Self Care | Payer: BC Managed Care – PPO | Source: Ambulatory Visit | Attending: Neurosurgery | Admitting: Neurosurgery

## 2013-01-07 ENCOUNTER — Ambulatory Visit (HOSPITAL_COMMUNITY): Payer: BC Managed Care – PPO | Admitting: Anesthesiology

## 2013-01-07 ENCOUNTER — Encounter (HOSPITAL_COMMUNITY): Payer: Self-pay | Admitting: Vascular Surgery

## 2013-01-07 ENCOUNTER — Encounter (HOSPITAL_COMMUNITY)
Admission: RE | Disposition: A | Discharge status: Home / Self Care | Payer: Self-pay | Source: Ambulatory Visit | Attending: Neurosurgery

## 2013-01-07 ENCOUNTER — Ambulatory Visit (HOSPITAL_COMMUNITY): Payer: BC Managed Care – PPO

## 2013-01-07 ENCOUNTER — Encounter (HOSPITAL_COMMUNITY): Payer: Self-pay | Admitting: *Deleted

## 2013-01-07 DIAGNOSIS — Y832 Surgical operation with anastomosis, bypass or graft as the cause of abnormal reaction of the patient, or of later complication, without mention of misadventure at the time of the procedure: Secondary | ICD-10-CM | POA: Insufficient documentation

## 2013-01-07 DIAGNOSIS — Z981 Arthrodesis status: Secondary | ICD-10-CM | POA: Insufficient documentation

## 2013-01-07 DIAGNOSIS — Z79899 Other long term (current) drug therapy: Secondary | ICD-10-CM | POA: Insufficient documentation

## 2013-01-07 DIAGNOSIS — R300 Dysuria: Secondary | ICD-10-CM | POA: Insufficient documentation

## 2013-01-07 DIAGNOSIS — T84498A Other mechanical complication of other internal orthopedic devices, implants and grafts, initial encounter: Secondary | ICD-10-CM | POA: Insufficient documentation

## 2013-01-07 DIAGNOSIS — I10 Essential (primary) hypertension: Secondary | ICD-10-CM | POA: Insufficient documentation

## 2013-01-07 HISTORY — PX: POSTERIOR CERVICAL FUSION/FORAMINOTOMY: SHX5038

## 2013-01-07 LAB — URINALYSIS, ROUTINE W REFLEX MICROSCOPIC
Bilirubin Urine: NEGATIVE
Glucose, UA: NEGATIVE mg/dL
Hgb urine dipstick: NEGATIVE
Ketones, ur: NEGATIVE mg/dL
Leukocytes, UA: NEGATIVE
Nitrite: NEGATIVE
Protein, ur: NEGATIVE mg/dL
Specific Gravity, Urine: 1.026 (ref 1.005–1.030)
Urobilinogen, UA: 0.2 mg/dL (ref 0.0–1.0)
pH: 5.5 (ref 5.0–8.0)

## 2013-01-07 SURGERY — POSTERIOR CERVICAL FUSION/FORAMINOTOMY LEVEL 2
Anesthesia: General | Site: Neck | Wound class: Clean

## 2013-01-07 MED ORDER — SODIUM CHLORIDE 0.9 % IR SOLN
Status: DC | PRN
  Administered 2013-01-07: 08:00:00

## 2013-01-07 MED ORDER — BISACODYL 10 MG RE SUPP: 10.0000 mg | Freq: Every day | RECTAL | Status: DC | PRN

## 2013-01-07 MED ORDER — LIDOCAINE HCL 4 % MT SOLN
OROMUCOSAL | Status: DC | PRN
  Administered 2013-01-07: 4 mL via TOPICAL

## 2013-01-07 MED ORDER — KETOROLAC TROMETHAMINE 30 MG/ML IJ SOLN
30.0000 mg | Freq: Four times a day (QID) | INTRAMUSCULAR | Status: AC
  Administered 2013-01-07 – 2013-01-09 (×8): 30 mg via INTRAVENOUS
  Filled 2013-01-07 (×8): qty 1

## 2013-01-07 MED ORDER — PROPOFOL 10 MG/ML IV BOLUS
INTRAVENOUS | Status: DC | PRN
  Administered 2013-01-07: 30 mg via INTRAVENOUS
  Administered 2013-01-07: 200 mg via INTRAVENOUS
  Administered 2013-01-07: 50 mg via INTRAVENOUS

## 2013-01-07 MED ORDER — HYDROMORPHONE HCL PF 1 MG/ML IJ SOLN
INTRAMUSCULAR | Status: AC
  Filled 2013-01-07: qty 1

## 2013-01-07 MED ORDER — CLONAZEPAM 0.5 MG PO TABS: 1.0000 mg | ORAL_TABLET | Freq: Two times a day (BID) | ORAL | Status: DC | PRN

## 2013-01-07 MED ORDER — FLUTICASONE PROPIONATE 50 MCG/ACT NA SUSP
2.0000 | Freq: Every day | NASAL | Status: DC
Start: 2013-01-07 — End: 2013-01-10
  Administered 2013-01-08 – 2013-01-10 (×3): 2 via NASAL
  Filled 2013-01-07: qty 16

## 2013-01-07 MED ORDER — LACTATED RINGERS IV SOLN
INTRAVENOUS | Status: DC | PRN
  Administered 2013-01-07 (×2): via INTRAVENOUS

## 2013-01-07 MED ORDER — PROMETHAZINE HCL 25 MG/ML IJ SOLN: 6.2500 mg | INTRAMUSCULAR | Status: DC | PRN

## 2013-01-07 MED ORDER — BUDESONIDE-FORMOTEROL FUMARATE 160-4.5 MCG/ACT IN AERO
2.0000 | INHALATION_SPRAY | Freq: Two times a day (BID) | RESPIRATORY_TRACT | Status: DC
  Administered 2013-01-07 – 2013-01-10 (×6): 2 via RESPIRATORY_TRACT
  Filled 2013-01-07: qty 6

## 2013-01-07 MED ORDER — ZOLPIDEM TARTRATE 5 MG PO TABS: 5.0000 mg | ORAL_TABLET | Freq: Every evening | ORAL | Status: DC | PRN

## 2013-01-07 MED ORDER — BACITRACIN 50000 UNITS IM SOLR
INTRAMUSCULAR | Status: AC
  Filled 2013-01-07: qty 1

## 2013-01-07 MED ORDER — ONDANSETRON HCL 4 MG/2ML IJ SOLN
INTRAMUSCULAR | Status: DC | PRN
  Administered 2013-01-07: 4 mg via INTRAVENOUS

## 2013-01-07 MED ORDER — KCL IN DEXTROSE-NACL 10-5-0.45 MEQ/L-%-% IV SOLN
INTRAVENOUS | Status: DC
  Filled 2013-01-07 (×10): qty 1000

## 2013-01-07 MED ORDER — ACETAMINOPHEN 10 MG/ML IV SOLN
1000.0000 mg | Freq: Four times a day (QID) | INTRAVENOUS | Status: AC
  Administered 2013-01-07 – 2013-01-08 (×4): 1000 mg via INTRAVENOUS
  Filled 2013-01-07 (×6): qty 100

## 2013-01-07 MED ORDER — ACETAMINOPHEN 650 MG RE SUPP: 650.0000 mg | RECTAL | Status: DC | PRN

## 2013-01-07 MED ORDER — KETOROLAC TROMETHAMINE 30 MG/ML IJ SOLN: 30.0000 mg | Freq: Once | INTRAMUSCULAR | Status: DC

## 2013-01-07 MED ORDER — ACETAMINOPHEN 10 MG/ML IV SOLN
INTRAVENOUS | Status: AC
  Filled 2013-01-07: qty 100

## 2013-01-07 MED ORDER — PHENYLEPHRINE HCL 10 MG/ML IJ SOLN
INTRAMUSCULAR | Status: DC | PRN
  Administered 2013-01-07: 160 ug via INTRAVENOUS
  Administered 2013-01-07: 120 ug via INTRAVENOUS
  Administered 2013-01-07: 160 ug via INTRAVENOUS
  Administered 2013-01-07 (×3): 120 ug via INTRAVENOUS

## 2013-01-07 MED ORDER — CITALOPRAM HYDROBROMIDE 40 MG PO TABS
40.0000 mg | ORAL_TABLET | Freq: Every day | ORAL | Status: DC
  Administered 2013-01-08 – 2013-01-10 (×3): 40 mg via ORAL
  Filled 2013-01-07 (×3): qty 1

## 2013-01-07 MED ORDER — GENTAMICIN IN SALINE 1.6-0.9 MG/ML-% IV SOLN
80.0000 mg | INTRAVENOUS | Status: AC
  Administered 2013-01-07 (×2): 80 mg via INTRAVENOUS
  Filled 2013-01-07: qty 50

## 2013-01-07 MED ORDER — FENTANYL CITRATE 0.05 MG/ML IJ SOLN
INTRAMUSCULAR | Status: DC | PRN
  Administered 2013-01-07: 100 ug via INTRAVENOUS
  Administered 2013-01-07 (×2): 50 ug via INTRAVENOUS

## 2013-01-07 MED ORDER — ARTIFICIAL TEARS OP OINT
TOPICAL_OINTMENT | OPHTHALMIC | Status: DC | PRN
  Administered 2013-01-07: 1 via OPHTHALMIC

## 2013-01-07 MED ORDER — ACYCLOVIR 400 MG PO TABS
400.0000 mg | ORAL_TABLET | Freq: Every day | ORAL | Status: DC
  Administered 2013-01-07 – 2013-01-10 (×4): 400 mg via ORAL
  Filled 2013-01-07 (×4): qty 1

## 2013-01-07 MED ORDER — ESTROGENS CONJUGATED 1.25 MG PO TABS
1.2500 mg | ORAL_TABLET | Freq: Every day | ORAL | Status: DC
  Administered 2013-01-08 – 2013-01-10 (×3): 1.25 mg via ORAL
  Filled 2013-01-07 (×3): qty 1

## 2013-01-07 MED ORDER — CEFAZOLIN SODIUM-DEXTROSE 2-3 GM-% IV SOLR
INTRAVENOUS | Status: AC
  Administered 2013-01-07: 2 g via INTRAVENOUS
  Filled 2013-01-07: qty 50

## 2013-01-07 MED ORDER — BACITRACIN ZINC 500 UNIT/GM EX OINT
TOPICAL_OINTMENT | CUTANEOUS | Status: DC | PRN
  Administered 2013-01-07: 1 via TOPICAL

## 2013-01-07 MED ORDER — HYDROCHLOROTHIAZIDE 25 MG PO TABS
25.0000 mg | ORAL_TABLET | Freq: Every day | ORAL | Status: DC
  Administered 2013-01-07 – 2013-01-10 (×2): 25 mg via ORAL
  Filled 2013-01-07 (×4): qty 1

## 2013-01-07 MED ORDER — ALUM & MAG HYDROXIDE-SIMETH 200-200-20 MG/5ML PO SUSP: 30.0000 mL | Freq: Four times a day (QID) | ORAL | Status: DC | PRN

## 2013-01-07 MED ORDER — NEOSTIGMINE METHYLSULFATE 1 MG/ML IJ SOLN
INTRAMUSCULAR | Status: DC | PRN
  Administered 2013-01-07: 4 mg via INTRAVENOUS

## 2013-01-07 MED ORDER — OXYCODONE HCL 5 MG/5ML PO SOLN: 5.0000 mg | Freq: Once | ORAL | Status: DC | PRN

## 2013-01-07 MED ORDER — MAGNESIUM HYDROXIDE 400 MG/5ML PO SUSP: 30.0000 mL | Freq: Every day | ORAL | Status: DC | PRN

## 2013-01-07 MED ORDER — MORPHINE SULFATE 4 MG/ML IJ SOLN: 4.0000 mg | INTRAMUSCULAR | Status: DC | PRN

## 2013-01-07 MED ORDER — SODIUM CHLORIDE 0.9 % IV SOLN: 250.0000 mL | INTRAVENOUS | Status: DC

## 2013-01-07 MED ORDER — CYCLOBENZAPRINE HCL 10 MG PO TABS
10.0000 mg | ORAL_TABLET | Freq: Three times a day (TID) | ORAL | Status: DC | PRN
  Administered 2013-01-07 – 2013-01-10 (×3): 10 mg via ORAL
  Filled 2013-01-07 (×3): qty 1

## 2013-01-07 MED ORDER — KETOROLAC TROMETHAMINE 30 MG/ML IJ SOLN
INTRAMUSCULAR | Status: AC
  Filled 2013-01-07: qty 1

## 2013-01-07 MED ORDER — SODIUM CHLORIDE 0.9 % IV SOLN
INTRAVENOUS | Status: AC
  Filled 2013-01-07: qty 500

## 2013-01-07 MED ORDER — PHENOL 1.4 % MT LIQD: 1.0000 | OROMUCOSAL | Status: DC | PRN

## 2013-01-07 MED ORDER — CLONAZEPAM 1 MG PO TABS
1.0000 mg | ORAL_TABLET | Freq: Two times a day (BID) | ORAL | Status: DC | PRN
Start: 2013-01-07 — End: 2013-01-07

## 2013-01-07 MED ORDER — OXYBUTYNIN CHLORIDE ER 10 MG PO TB24
10.0000 mg | ORAL_TABLET | Freq: Every day | ORAL | Status: DC
  Administered 2013-01-07 – 2013-01-10 (×3): 10 mg via ORAL
  Filled 2013-01-07 (×4): qty 1

## 2013-01-07 MED ORDER — SODIUM CHLORIDE 0.9 % IJ SOLN: 3.0000 mL | INTRAMUSCULAR | Status: DC | PRN

## 2013-01-07 MED ORDER — FENOFIBRATE 160 MG PO TABS
160.0000 mg | ORAL_TABLET | Freq: Every day | ORAL | Status: DC
  Administered 2013-01-07 – 2013-01-10 (×4): 160 mg via ORAL
  Filled 2013-01-07 (×4): qty 1

## 2013-01-07 MED ORDER — OXYCODONE HCL 5 MG PO TABS
5.0000 mg | ORAL_TABLET | ORAL | Status: DC | PRN
  Administered 2013-01-07: 10 mg via ORAL
  Administered 2013-01-07: 5 mg via ORAL
  Administered 2013-01-07 – 2013-01-08 (×2): 10 mg via ORAL
  Filled 2013-01-07 (×4): qty 2

## 2013-01-07 MED ORDER — LIDOCAINE-EPINEPHRINE 1 %-1:100000 IJ SOLN
INTRAMUSCULAR | Status: DC | PRN
  Administered 2013-01-07: 10 mL

## 2013-01-07 MED ORDER — LISINOPRIL 5 MG PO TABS
5.0000 mg | ORAL_TABLET | Freq: Every day | ORAL | Status: DC
  Administered 2013-01-07 – 2013-01-10 (×2): 5 mg via ORAL
  Filled 2013-01-07 (×4): qty 1

## 2013-01-07 MED ORDER — MENTHOL 3 MG MT LOZG: 1.0000 | LOZENGE | OROMUCOSAL | Status: DC | PRN

## 2013-01-07 MED ORDER — BUPIVACAINE HCL (PF) 0.5 % IJ SOLN
INTRAMUSCULAR | Status: DC | PRN
  Administered 2013-01-07: 10 mL

## 2013-01-07 MED ORDER — TOPIRAMATE 25 MG PO TABS
50.0000 mg | ORAL_TABLET | Freq: Two times a day (BID) | ORAL | Status: DC
  Administered 2013-01-07 – 2013-01-10 (×7): 50 mg via ORAL
  Filled 2013-01-07 (×8): qty 2

## 2013-01-07 MED ORDER — 0.9 % SODIUM CHLORIDE (POUR BTL) OPTIME
TOPICAL | Status: DC | PRN
  Administered 2013-01-07: 1000 mL

## 2013-01-07 MED ORDER — LIDOCAINE HCL (CARDIAC) 20 MG/ML IV SOLN
INTRAVENOUS | Status: DC | PRN
  Administered 2013-01-07: 50 mg via INTRAVENOUS

## 2013-01-07 MED ORDER — HYDROXYZINE HCL 25 MG PO TABS: 50.0000 mg | ORAL_TABLET | ORAL | Status: DC | PRN

## 2013-01-07 MED ORDER — GLYCOPYRROLATE 0.2 MG/ML IJ SOLN
INTRAMUSCULAR | Status: DC | PRN
  Administered 2013-01-07: 0.6 mg via INTRAVENOUS

## 2013-01-07 MED ORDER — ROCURONIUM BROMIDE 100 MG/10ML IV SOLN
INTRAVENOUS | Status: DC | PRN
  Administered 2013-01-07: 10 mg via INTRAVENOUS
  Administered 2013-01-07: 50 mg via INTRAVENOUS

## 2013-01-07 MED ORDER — MONTELUKAST SODIUM 10 MG PO TABS
10.0000 mg | ORAL_TABLET | Freq: Every day | ORAL | Status: DC
  Administered 2013-01-07 – 2013-01-09 (×3): 10 mg via ORAL
  Filled 2013-01-07 (×4): qty 1

## 2013-01-07 MED ORDER — SODIUM CHLORIDE 0.9 % IJ SOLN
3.0000 mL | Freq: Two times a day (BID) | INTRAMUSCULAR | Status: DC
  Administered 2013-01-07 – 2013-01-10 (×4): 3 mL via INTRAVENOUS

## 2013-01-07 MED ORDER — MIDAZOLAM HCL 5 MG/5ML IJ SOLN
INTRAMUSCULAR | Status: DC | PRN
  Administered 2013-01-07 (×2): 1 mg via INTRAVENOUS

## 2013-01-07 MED ORDER — THROMBIN 20000 UNITS EX SOLR
CUTANEOUS | Status: DC | PRN
  Administered 2013-01-07: 08:00:00 via TOPICAL

## 2013-01-07 MED ORDER — ACETAMINOPHEN 325 MG PO TABS
650.0000 mg | ORAL_TABLET | ORAL | Status: DC | PRN
  Administered 2013-01-08: 650 mg via ORAL
  Filled 2013-01-07: qty 2

## 2013-01-07 MED ORDER — ONDANSETRON HCL 4 MG/2ML IJ SOLN: 4.0000 mg | Freq: Four times a day (QID) | INTRAMUSCULAR | Status: DC | PRN

## 2013-01-07 MED ORDER — HYDROMORPHONE HCL PF 1 MG/ML IJ SOLN
0.2500 mg | INTRAMUSCULAR | Status: DC | PRN
  Administered 2013-01-07 (×2): 0.5 mg via INTRAVENOUS

## 2013-01-07 MED ORDER — OXYCODONE HCL 5 MG PO TABS: 5.0000 mg | ORAL_TABLET | Freq: Once | ORAL | Status: DC | PRN

## 2013-01-07 SURGICAL SUPPLY — 72 items
ADH SKN CLS APL DERMABOND .7 (GAUZE/BANDAGES/DRESSINGS) ×1
BAG DECANTER FOR FLEXI CONT (MISCELLANEOUS) ×2 IMPLANT
BIT DRILL NEURO 2X3.1 SFT TUCH (MISCELLANEOUS) ×1 IMPLANT
BIT DRILL VUEPOINT II (BIT) IMPLANT
BIT DRILL WIRE PASS 1.3MM (BIT) IMPLANT
BLADE SURG 11 STRL SS (BLADE) ×1 IMPLANT
BLADE SURG ROTATE 9660 (MISCELLANEOUS) ×1 IMPLANT
BRUSH SCRUB EZ PLAIN DRY (MISCELLANEOUS) ×2 IMPLANT
CANISTER SUCTION 2500CC (MISCELLANEOUS) ×2 IMPLANT
CLOTH BEACON ORANGE TIMEOUT ST (SAFETY) ×2 IMPLANT
CONT SPEC 4OZ CLIKSEAL STRL BL (MISCELLANEOUS) ×2 IMPLANT
COVER TABLE BACK 60X90 (DRAPES) ×2 IMPLANT
DECANTER SPIKE VIAL GLASS SM (MISCELLANEOUS) ×1 IMPLANT
DERMABOND ADVANCED (GAUZE/BANDAGES/DRESSINGS) ×1
DERMABOND ADVANCED .7 DNX12 (GAUZE/BANDAGES/DRESSINGS) ×1 IMPLANT
DRAPE C-ARM 42X72 X-RAY (DRAPES) ×4 IMPLANT
DRAPE C-ARMOR (DRAPES) ×1 IMPLANT
DRAPE LAPAROTOMY 100X72 PEDS (DRAPES) ×2 IMPLANT
DRAPE POUCH INSTRU U-SHP 10X18 (DRAPES) ×2 IMPLANT
DRAPE PROXIMA HALF (DRAPES) IMPLANT
DRILL BIT VUEPOINT II (BIT) ×2
DRILL NEURO 2X3.1 SOFT TOUCH (MISCELLANEOUS) ×2
DRILL WIRE PASS 1.3MM (BIT)
DRSG EMULSION OIL 3X3 NADH (GAUZE/BANDAGES/DRESSINGS) IMPLANT
ELECT REM PT RETURN 9FT ADLT (ELECTROSURGICAL) ×2
ELECTRODE REM PT RTRN 9FT ADLT (ELECTROSURGICAL) ×1 IMPLANT
EVACUATOR 1/8 PVC DRAIN (DRAIN) IMPLANT
GAUZE SPONGE 4X4 16PLY XRAY LF (GAUZE/BANDAGES/DRESSINGS) IMPLANT
GLOVE BIOGEL PI IND STRL 8 (GLOVE) ×1 IMPLANT
GLOVE BIOGEL PI INDICATOR 8 (GLOVE) ×1
GLOVE ECLIPSE 7.5 STRL STRAW (GLOVE) ×2 IMPLANT
GLOVE ECLIPSE 8.5 STRL (GLOVE) ×1 IMPLANT
GLOVE EXAM NITRILE LRG STRL (GLOVE) IMPLANT
GLOVE EXAM NITRILE MD LF STRL (GLOVE) ×2 IMPLANT
GLOVE EXAM NITRILE XL STR (GLOVE) IMPLANT
GLOVE EXAM NITRILE XS STR PU (GLOVE) IMPLANT
GOWN BRE IMP SLV AUR LG STRL (GOWN DISPOSABLE) ×1 IMPLANT
GOWN BRE IMP SLV AUR XL STRL (GOWN DISPOSABLE) ×3 IMPLANT
GOWN STRL REIN 2XL LVL4 (GOWN DISPOSABLE) IMPLANT
HEMOSTAT SURGICEL 2X14 (HEMOSTASIS) IMPLANT
KIT BASIN OR (CUSTOM PROCEDURE TRAY) ×2 IMPLANT
KIT INFUSE XX SMALL 0.7CC (Orthopedic Implant) ×1 IMPLANT
KIT ROOM TURNOVER OR (KITS) ×2 IMPLANT
NDL SPNL 18GX3.5 QUINCKE PK (NEEDLE) ×1 IMPLANT
NDL SPNL 22GX3.5 QUINCKE BK (NEEDLE) ×2 IMPLANT
NEEDLE SPNL 18GX3.5 QUINCKE PK (NEEDLE) ×2 IMPLANT
NEEDLE SPNL 22GX3.5 QUINCKE BK (NEEDLE) ×4 IMPLANT
NS IRRIG 1000ML POUR BTL (IV SOLUTION) ×2 IMPLANT
PACK LAMINECTOMY NEURO (CUSTOM PROCEDURE TRAY) ×2 IMPLANT
PAD ARMBOARD 7.5X6 YLW CONV (MISCELLANEOUS) ×5 IMPLANT
PIN MAYFIELD SKULL DISP (PIN) ×2 IMPLANT
ROD VUEPOINT 80MM (Rod) ×1 IMPLANT
SCREW 4.0X14MM (Screw) ×2 IMPLANT
SCREW BN 14X4XMA NS SPNE (Screw) IMPLANT
SCREW MA MM 3.5X12 (Screw) ×5 IMPLANT
SCREW SET THREADED (Screw) ×6 IMPLANT
SPONGE GAUZE 4X4 12PLY (GAUZE/BANDAGES/DRESSINGS) ×1 IMPLANT
SPONGE LAP 4X18 X RAY DECT (DISPOSABLE) IMPLANT
SPONGE SURGIFOAM ABS GEL 100 (HEMOSTASIS) ×2 IMPLANT
STAPLER SKIN PROX WIDE 3.9 (STAPLE) ×2 IMPLANT
STRIP BIOACTIVE VITOSS 25X52X4 (Orthopedic Implant) ×1 IMPLANT
SUT ETHILON 3 0 FSL (SUTURE) IMPLANT
SUT VIC AB 0 CT1 18XCR BRD8 (SUTURE) ×1 IMPLANT
SUT VIC AB 0 CT1 8-18 (SUTURE) ×4
SUT VIC AB 2-0 CP2 18 (SUTURE) ×2 IMPLANT
SYR 20ML ECCENTRIC (SYRINGE) ×2 IMPLANT
TAPE CLOTH SURG 4X10 WHT LF (GAUZE/BANDAGES/DRESSINGS) ×1 IMPLANT
TOWEL OR 17X24 6PK STRL BLUE (TOWEL DISPOSABLE) ×2 IMPLANT
TOWEL OR 17X26 10 PK STRL BLUE (TOWEL DISPOSABLE) ×2 IMPLANT
TRAY FOLEY CATH 14FRSI W/METER (CATHETERS) IMPLANT
UNDERPAD 30X30 INCONTINENT (UNDERPADS AND DIAPERS) ×2 IMPLANT
WATER STERILE IRR 1000ML POUR (IV SOLUTION) ×2 IMPLANT

## 2013-01-07 NOTE — Preoperative (Signed)
Beta Blockers   Reason not to administer Beta Blockers:Not Applicable 

## 2013-01-07 NOTE — Op Note (Signed)
01/07/2013   9:31 AM  PATIENT:  [name]  42 y.o. 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 The 3 pin Mayfield head holder was applied, and the patient was turned to a prone position. Posterior neck and upper back were prepped with Betadine soap and solution, and draped in a sterile fashion. The C-arm fluoroscope was used to localize the C5-7 level, the midline was infiltrated with local site with epinephrine. Midline incision was made, carried down to the subcutaneous tissue. Paracervical fascia was incised bilaterally, and the paracervical musculature was elevated a subperiosteal fashion. Subcutaneous retractors placed. C-arm fluoroscopy was used to localize the C5, C6, and C7 levels, and then dissection was carried out laterally exposing the facets of C5-6 and C6-7. Have holes were made in the lateral masses of C5, C6, and C7 bilaterally, and then we drilled screw holes bilaterally at each level, and a superior, lateral, and ventral trajectory. Each screw hole exam with a ball probe, good bone insertion found. The posterior cortex was tapped, and screws were placed bilaterally. We used 3.5 x 12 mm screws bilaterally at C5, on the right side at C6, and on bilaterally at C7. We used a 4 x 14 mm screw on the left at C6. Once all 6 screws  were in place, we cut a 80 mm pre-lordosed rods in half. Each half was used, on either side. There was secured with locking caps, and once all 6 locking caps were placed final tightening was performed against a counter torque. Using the high-speed drill we decorticated the facet surfaces bilaterally at C5-6 and C6-7, as well as the laminar surfaces of C5, C6 and C7 bilaterally. We packed a combination of infuse and Vitoss BA within the facet joints bilaterally at each level, and laid a combination of infuse and Vitoss BA over the C5-7 laminar surfaces. We then proceeded with closure. The deep fascia was closed with interrupted undyed 0 Vicryl sutures, the Scarpa's fascia was closed with interrupted undyed 0 Vicryl sutures, and the subcutaneous and subcuticular layer was closed with interrupted inverted 2-0 undyed Vicryl suture. Skin edges were approximated with Dermabond. The wounds dressed with sterile gauze and Hypafix. Following surgery the patient was turned back to a supine position, the 3 pin Mayfield head holder was removed, and the patient to be reversed an anesthetic, extubated, and transferred to the recovery room for further care.  PLAN OF CARE: Admit for overnight observation  PATIENT DISPOSITION:  PACU - hemodynamically stable.   Delay start of Pharmacological VTE agent (>24hrs) due to surgical blood loss or risk of bleeding:  yes

## 2013-01-07 NOTE — Transfer of Care (Signed)
Immediate Anesthesia Transfer of Care Note  Patient: [name]  Procedure(s) Performed: Procedure(s) with comments: POSTERIOR CERVICAL FUSION/FORAMINOTOMY LEVEL 2 (N/A) - Posterior Cervical Five-Six/Six-Seven Arthrodesis with Instrumentation  Patient Location: PACU  Anesthesia Type:General  Level of Consciousness: lethargic and responds to stimulation  Airway & Oxygen Therapy: Patient Spontanous Breathing and Patient connected to nasal cannula oxygen  Post-op Assessment: Report given to PACU RN  Post vital signs: Reviewed and stable  Complications: No apparent anesthesia complications

## 2013-01-07 NOTE — Anesthesia Procedure Notes (Signed)
Procedure Name: Intubation Date/Time: 01/07/2013 7:42 AM Performed by: Sampson Si E Pre-anesthesia Checklist: Patient identified, Timeout performed, Emergency Drugs available, Suction available and Patient being monitored Patient Re-evaluated:Patient Re-evaluated prior to inductionOxygen Delivery Method: Circle system utilized Preoxygenation: Pre-oxygenation with 100% oxygen Intubation Type: IV induction Ventilation: Mask ventilation without difficulty Laryngoscope Size: Mac and 3 Grade View: Grade I Tube type: Oral Tube size: 7.0 mm Number of attempts: 1 Airway Equipment and Method: Stylet and LTA kit utilized Placement Confirmation: ETT inserted through vocal cords under direct vision,  positive ETCO2 and breath sounds checked- equal and bilateral Secured at: 21 cm Tube secured with: Tape Dental Injury: Teeth and Oropharynx as per pre-operative assessment

## 2013-01-07 NOTE — Anesthesia Preprocedure Evaluation (Addendum)
Anesthesia Evaluation  Patient identified by MRN, date of birth, ID band Patient awake    Reviewed: Allergy & Precautions, H&P , NPO status , Patient's Chart, lab work & pertinent test results  Airway Mallampati: II TM Distance: >3 FB Neck ROM: Limited    Dental  (+) Teeth Intact and Dental Advisory Given   Pulmonary asthma , sleep apnea and Continuous Positive Airway Pressure Ventilation ,  breath sounds clear to auscultation        Cardiovascular hypertension, Rhythm:Regular Rate:Normal     Neuro/Psych  Headaches,  Neuromuscular disease    GI/Hepatic GERD-  ,  Endo/Other    Renal/GU      Musculoskeletal   Abdominal   Peds  Hematology   Anesthesia Other Findings   Reproductive/Obstetrics                          Anesthesia Physical Anesthesia Plan  ASA: II  Anesthesia Plan: General   Post-op Pain Management:    Induction: Intravenous  Airway Management Planned: Oral ETT  Additional Equipment:   Intra-op Plan:   Post-operative Plan: Extubation in OR  Informed Consent: I have reviewed the patients History and Physical, chart, labs and discussed the procedure including the risks, benefits and alternatives for the proposed anesthesia with the patient or authorized representative who has indicated his/her understanding and acceptance.     Plan Discussed with: CRNA and Surgeon  Anesthesia Plan Comments:         Anesthesia Quick Evaluation

## 2013-01-07 NOTE — H&P (Signed)
Subjective: Patient is a 42 y.o. 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 Patient is over two-year status post a C5-6 and C6-7 ACDF, CT scan shows nonhealing of the fusion, the patient has had persistent posterior neck pain. Notably she status post a previous C4-5 ACDF has healed well. She is admitted now for a C5-7 posterior cervical arthrodesis with lateral mass screws and rods, and bone graft.    Patient Active Problem List   Diagnosis Date Noted  . Hyperglycemia 11/07/2012  . Other malaise and fatigue 11/07/2012  . ANXIETY 04/22/2010  . GERD 04/22/2010  . ILEUS 04/22/2010  . IRRITABLE BOWEL SYNDROME 04/22/2010  . SLEEP APNEA 04/22/2010  . LIVER FUNCTION TESTS, ABNORMAL, HX OF 04/22/2010  . HYPERLIPIDEMIA-MIXED 12/03/2009  . HYPERTENSION 12/03/2009  . FATTY LIVER DISEASE 12/03/2009  . CHEST PAIN UNSPECIFIED 12/03/2009   Past Medical History  Diagnosis Date  . IBS (irritable bowel syndrome)   . Overactive bladder   . High cholesterol   . Migraine   . Anxiety   . Hypertension   . Reactive airway disease   . Fatty liver   . Asthma     exercise induced  . Sleep apnea     uses cpap occ  . Personal history of kidney stones   . Renal cyst   . GERD (gastroesophageal reflux disease)     occ  . Anemia     hx  . Hx of abdominoplasty     Past Surgical History  Procedure Laterality Date  . Cesarean section    . Total abdominal hysterectomy w/ bilateral salpingoophorectomy    . Cervical disc surgery  07,11  . Breast surgery  09    breast red     Prescriptions prior to admission  Medication Sig Dispense Refill  . acyclovir (ZOVIRAX) 400 MG tablet Take 1 tablet (400 mg total) by mouth daily.  30 tablet  0  . b complex vitamins tablet Take 1 tablet by mouth daily.      . budesonide-formoterol (SYMBICORT) 160-4.5 MCG/ACT inhaler Inhale 2 puffs into the lungs 2 (two) times daily.      . Calcium Carbonate-Vit D-Min (CALCIUM 1200 PO) Take 2  tablets by mouth daily. 2 tabs daily      . Cholecalciferol (VITAMIN D3) 2000 UNITS TABS Take 1 capsule by mouth daily. One tab daily      . citalopram (CELEXA) 40 MG tablet Take 1 tablet (40 mg total) by mouth daily.  30 tablet  0  . clonazePAM (KLONOPIN) 1 MG tablet Take 1 mg by mouth 2 (two) times daily as needed for anxiety.      Marland Kitchen estrogens, conjugated, (PREMARIN) 1.25 MG tablet Take 1.25 mg by mouth daily.      . fenofibrate 160 MG tablet Take 1 tablet (160 mg total) by mouth daily.  30 tablet  6  . fluticasone (FLONASE) 50 MCG/ACT nasal spray Place 2 sprays into the nose daily.  16 g  5  . hydrochlorothiazide (HYDRODIURIL) 25 MG tablet Take 1 tablet (25 mg total) by mouth daily.  30 tablet  0  . lisinopril (PRINIVIL,ZESTRIL) 5 MG tablet take 1 tablet by mouth once daily NEEDS OFFICE VISIT  30 tablet  0  . Melatonin 5 MG TABS Take 1 tablet by mouth at bedtime as needed. One at bedtime for sleep      . montelukast (SINGULAIR) 10 MG tablet take 1 tablet by mouth once daily  30 tablet  6  . oxybutynin (DITROPAN-XL) 10 MG 24 hr tablet Take 10 mg by mouth daily.      Marland Kitchen topiramate (TOPAMAX) 50 MG tablet Take 50 mg by mouth 2 (two) times daily.       Allergies  Allergen Reactions  . Levaquin (Levofloxacin In D5w)     N/v/d    History  Substance Use Topics  . Smoking status: Never Smoker   . Smokeless tobacco: Not on file     Comment: oc alcohol  . Alcohol Use: Yes    Family History  Problem Relation Age of Onset  . Cirrhosis Father   . Diabetes Father   . Hypertension Father   . Cancer Other   . Hypertension Mother      Review of Systems A comprehensive review of systems was negative.  Objective: Vital signs in last 24 hours: Temp:  [97.9 F (36.6 C)] 97.9 F (36.6 C) (06/16 0607) Pulse Rate:  [59] 59 (06/16 0607) Resp:  [18] 18 (06/16 0607) BP: (99)/(70) 99/70 mmHg (06/16 0607) SpO2:  [97 %] 97 % (06/16 0607)  EXAM: Patient well-developed well-nourished white female in  no acute distress. Lungs are clear to auscultation , the patient has symmetrical respiratory excursion. Heart has a regular rate and rhythm normal S1 and S2 no murmur.   Abdomen is soft nontender nondistended bowel sounds are present. Extremity examination shows no clubbing cyanosis or edema. Motor examination shows 5 over 5 strength in the upper extremities including the deltoid biceps triceps and intrinsics and grip. Sensation is intact to pinprick throughout the digits of the upper extremities. Reflexes are symmetrical and without evidence of pathologic reflexes. Patient has a normal gait and stance.   Data Review:CBC    Component Value Date/Time   WBC 7.0 12/31/2012 1045   RBC 4.17 12/31/2012 1045   HGB 13.2 12/31/2012 1045   HCT 38.5 12/31/2012 1045   PLT 344 12/31/2012 1045   MCV 92.3 12/31/2012 1045   MCH 31.7 12/31/2012 1045   MCHC 34.3 12/31/2012 1045   RDW 13.0 12/31/2012 1045   LYMPHSABS 3.9 11/07/2012 1125   MONOABS 0.6 11/07/2012 1125   EOSABS 0.3 11/07/2012 1125   BASOSABS 0.0 11/07/2012 1125                          BMET    Component Value Date/Time   NA 139 12/31/2012 1045   K 4.5 12/31/2012 1045   CL 106 12/31/2012 1045   CO2 25 12/31/2012 1045   GLUCOSE 86 12/31/2012 1045   BUN 20 12/31/2012 1045   CREATININE 0.92 12/31/2012 1045   CREATININE 1.03 11/07/2012 1125   CALCIUM 8.8 12/31/2012 1045   GFRNONAA 75* 12/31/2012 1045   GFRAA 87* 12/31/2012 1045     Assessment/Plan: Patient over 2 years status post a C5-6 and C6-7 ACDF, the arthrodesis not healed. Patient admitted now for the C5-7 posterior cervical arthrodesis.  I've discussed with the patient the nature of his condition, the nature the surgical procedure, the typical length of surgery, hospital stay, and overall recuperation. We discussed limitations postoperatively. I discussed risks of surgery including risks of infection, bleeding, possibly need for transfusion, the risk of nerve root dysfunction with pain, weakness, numbness, or  paresthesias, the risk of spinal cord dysfunction with paralysis of all 4 limbs and quadriplegia, and the risk of dural tear and CSF leakage and possible need for further surgery, the risk of failure of the arthrodesis  and the possible need for further surgery, and the risk of anesthetic complications including myocardial infarction, stroke, pneumonia, and death. We also discussed the need for postoperative immobilization in a cervical collar. Understanding all this the patient does wish to proceed with surgery and is admitted for such.    Hosie Spangle, MD 01/07/2013 7:23 AM

## 2013-01-07 NOTE — Progress Notes (Signed)
Filed Vitals:   01/07/13 1015 01/07/13 1022 01/07/13 1227 01/07/13 1547  BP:   109/73 109/81  Pulse: 79 88 66 72  Temp:  96.9 F (36.1 C) 97.9 F (36.6 C) 97.5 F (36.4 C)  TempSrc:      Resp: 16 14 16 16   SpO2: 100% 100% 95% 95%    Patient resting in bed, fairly comfortable but some incisional pain. Has been up and living in halls. Dressing clean dry. Unable to void, repeated bladder scans done, volume eventually reached 500 cc, and patient in and out catheterized for 500 cc. Moving all 4 extremities well  Plan: To progress to postoperative recovery, monitor voiding function. Encouraged to ambulate in the halls at least 2 more times tonight.  Hosie Spangle, MD 01/07/2013, 7:10 PM\

## 2013-01-07 NOTE — Anesthesia Postprocedure Evaluation (Signed)
  Anesthesia Post-op Note  Patient: [name]  Procedure(s) Performed: Procedure(s) with comments: POSTERIOR CERVICAL FUSION/FORAMINOTOMY LEVEL 2 (N/A) - Posterior Cervical Five-Six/Six-Seven Arthrodesis with Instrumentation  Patient Location: PACU  Anesthesia Type:General  Level of Consciousness: awake and alert   Airway and Oxygen Therapy: Patient Spontanous Breathing  Post-op Pain: mild  Post-op Assessment: Post-op Vital signs reviewed, Patient's Cardiovascular Status Stable, Respiratory Function Stable, Patent Airway, No signs of Nausea or vomiting and Pain level controlled  Post-op Vital Signs: stable  Complications: No apparent anesthesia complications

## 2013-01-08 ENCOUNTER — Encounter (HOSPITAL_COMMUNITY): Payer: Self-pay | Admitting: Neurosurgery

## 2013-01-08 LAB — URINE CULTURE
Colony Count: NO GROWTH
Culture: NO GROWTH

## 2013-01-08 MED ORDER — OXYCODONE-ACETAMINOPHEN 5-325 MG PO TABS
1.0000 | ORAL_TABLET | ORAL | Status: DC | PRN
  Administered 2013-01-08 – 2013-01-10 (×8): 2 via ORAL
  Filled 2013-01-08 (×8): qty 2

## 2013-01-08 NOTE — Progress Notes (Signed)
Filed Vitals:   01/07/13 2042 01/07/13 2059 01/08/13 0004 01/08/13 0512  BP: 140/83  142/87 145/76  Pulse: 67 78 75 72  Temp: 97.8 F (36.6 C)  97.6 F (36.4 C) 97.7 F (36.5 C)  TempSrc: Oral     Resp: 18 18 18 18   SpO2: 93% 96% 99% 99%    Patient fairly comfortable, using occasional OxyIR for pain. However has been unable to void, and is required to 3 in and out catheterizations. Past nursing staff to place a Foley catheter with next large bladder volume. Encouraged to ambulate in the halls at least 6 times a day. We'll have nursing staff removed dressing commonly the wound open to air.  Plan: Continue progress to postoperative recovery. We'll plan on bladder rest for a day and a half and then voiding trial.  Hosie Spangle, MD 01/08/2013, 8:22 AM

## 2013-01-09 NOTE — Progress Notes (Signed)
Filed Vitals:   01/08/13 2300 01/09/13 0548 01/09/13 0817 01/09/13 1233  BP: 96/58 96/70 107/62 111/60  Pulse: 86 77 85 110  Temp: 98.8 F (37.1 C) 98.6 F (37 C) 98.6 F (37 C) 99.6 F (37.6 C)  TempSrc:      Resp: 18 18 16 16   SpO2: 94% 100% 96% 92%    Patient did require placement of Foley catheter yesterday morning, has been ambulating in the halls. Dressing removed the nursing staff, wound clean and dry. Moving all 4 extremities well. Patient wants to hold off until the morning for a voiding trial, we'll have Foley DC'd at 0500 and monitor voiding function. Encouraged to ambulate in the halls actively, and encouraged to use the incentive spirometry regularly.  Plan: Continue progress the postoperative recovery.  Hosie Spangle, MD 01/09/2013, 1:22 PM

## 2013-01-10 MED ORDER — OXYCODONE-ACETAMINOPHEN 5-325 MG PO TABS: 1.0000 | ORAL_TABLET | ORAL | Status: DC | PRN

## 2013-01-10 MED ORDER — TAMSULOSIN HCL 0.4 MG PO CAPS
0.4000 mg | ORAL_CAPSULE | Freq: Every day | ORAL | Status: DC
  Administered 2013-01-10: 0.4 mg via ORAL
  Filled 2013-01-10: qty 1

## 2013-01-10 MED ORDER — TAMSULOSIN HCL 0.4 MG PO CAPS: 0.4000 mg | ORAL_CAPSULE | Freq: Every day | ORAL | Status: DC

## 2013-01-10 MED ORDER — CYCLOBENZAPRINE HCL 10 MG PO TABS: 10.0000 mg | ORAL_TABLET | Freq: Three times a day (TID) | ORAL | Status: DC | PRN

## 2013-01-10 NOTE — Progress Notes (Signed)
Pt. discharged home accompanied by husband. Prescriptions and discharge instructions given with verbalization of understanding. Incision site on neck with no s/s of infection - no swelling, redness, bleeding, and/or drainage noted. Extra dressing given for home use. Soft collar intact. Pain med given just before leaving. Opportunity given to ask questions but no question asked. Pt. transported out of this unit in wheelchair by the nurse tech. Patient educated about foley catheter care at home and extra supplies given to patient at discharged

## 2013-01-10 NOTE — Discharge Summary (Signed)
Physician Discharge Summary  Patient ID: [name] MRN: [id number] DOB/AGE: 08/15/1970 42 y.o.  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 In the preoperative holding area, complaint of dysuria and concern for possible UTI. After induction of anesthesia and intubation, patient in and out catheterized for urine sample which was sent for urinalysis and urine culture. Patient was given after the urinalysis and urine culture were sent, and in addition to her preoperative dose of Ancef, a dose of gentamicin 80 mg IV. Urinalysis showed negative leukocyte esterase and urine culture showed no growth the final report. Patient underwent a C5-C7 posterior cervical arthrodesis with lateral mass screws and rods, and bone graft.  Postoperatively she had moderate incisional pain, a small amount of serosanguineous drainage from her wound, but more significantly urinary retention. She required repeated in and out catheterizations, and after 3 catheterizations, a Foley catheter was placed, and she had 2 days of bladder rest, the Foley was then discontinued, but she continued to be unable to void. She was monitored with bladder scans which showed increasing bladder volume.  I consulted by phone with Dr. Lowella Bandy from [facility] who recommended placing a Foley catheter with a leg bag, initiating treatment Flomax 0.4 mg daily, and discharging to home, with followup with him in the office on Monday, June 23. All this was done.  As far as her neck is concerned, she's been given instructions regarding wound care and activities, she is to return for followup with me in 3 weeks.  Discharge Exam: Blood pressure 103/72,  pulse 86, temperature 98.8 F (37.1 C), temperature source Oral, resp. rate 18, SpO2 98.00%.  Disposition: Home   Future Appointments Provider Department Dept Phone   02/25/2013 11:00 AM Kathyrn Drown, MD [facility] [phone]       Medication List    TAKE these medications       acyclovir 400 MG tablet  Commonly known as:  ZOVIRAX  Take 1 tablet (400 mg total) by mouth daily.     b complex vitamins tablet  Take 1 tablet by mouth daily.     budesonide-formoterol 160-4.5 MCG/ACT inhaler  Commonly known as:  SYMBICORT  Inhale 2 puffs into the lungs 2 (two) times daily.     CALCIUM 1200 PO  Take 2 tablets by mouth daily. 2 tabs daily     citalopram 40 MG tablet  Commonly known as:  CELEXA  Take 1 tablet (40 mg total) by mouth daily.     clonazePAM 1 MG tablet  Commonly known as:  KLONOPIN  Take 1 mg by mouth 2 (two) times daily as needed for anxiety.     cyclobenzaprine 10 MG tablet  Commonly known as:  FLEXERIL  Take 1 tablet (10 mg total) by mouth 3 (three) times daily as needed for muscle spasms.     estrogens (conjugated) 1.25 MG tablet  Commonly known as:  PREMARIN  Take 1.25 mg by mouth daily.     fenofibrate 160 MG tablet  Take 1 tablet (160 mg total) by mouth daily.     fluticasone 50 MCG/ACT nasal spray  Commonly known as:  FLONASE  Place 2 sprays into the nose daily.     hydrochlorothiazide 25 MG tablet  Commonly known as:  HYDRODIURIL  Take 1  tablet (25 mg total) by mouth daily.     lisinopril 5 MG tablet  Commonly known as:  PRINIVIL,ZESTRIL  take 1 tablet by mouth once daily NEEDS OFFICE VISIT     Melatonin 5 MG Tabs  Take 1 tablet by mouth at bedtime as needed. One at bedtime for sleep     montelukast 10 MG tablet  Commonly known as:  SINGULAIR  take 1 tablet by mouth once daily     oxybutynin 10 MG 24 hr tablet  Commonly known as:  DITROPAN-XL  Take 10 mg by mouth daily.     oxyCODONE-acetaminophen 5-325 MG per tablet   Commonly known as:  PERCOCET/ROXICET  Take 1-2 tablets by mouth every 4 (four) hours as needed for pain.     tamsulosin 0.4 MG Caps  Commonly known as:  FLOMAX  Take 1 capsule (0.4 mg total) by mouth daily.     topiramate 50 MG tablet  Commonly known as:  TOPAMAX  Take 50 mg by mouth 2 (two) times daily.     Vitamin D3 2000 UNITS Tabs  Take 1 capsule by mouth daily. One tab daily           Follow-up Information   Schedule an appointment as soon as possible for a visit with NESI,MARC-HENRY, MD. (Call Friday, June 20, for appointment with Dr. Janice Norrie on Monday, June 23)    Contact information:   [address] [place] [id number] [phone]       Signed: Hosie Spangle, MD 01/10/2013, 5:18 PM

## 2013-01-10 NOTE — Progress Notes (Signed)
Filed Vitals:   01/09/13 1939 01/09/13 2327 01/10/13 0300 01/10/13 0813  BP: 93/59 101/69 103/69 106/61  Pulse: 95 89 98 90  Temp: 100 F (37.8 C) 99.2 F (37.3 C) 99.6 F (37.6 C) 98.1 F (36.7 C)  TempSrc: Oral Oral    Resp: 18 18 16 18   SpO2: 93% 95% 95% 92%    Patient with moderate incisional discomfort extending from the posterior neck towards the shoulders. Dressing changed, mild serosanguineous drainage in the bandage, fresh dressing applied. Foley DC'd 7 hours ago, no void so far, bladder scan shows 350 cc. Encouraged patient to ambulate, we'll monitor voiding function, but if unable to void, will consult urology. Patient has previously seen Dr. Kathie Rhodes.  Plan: Continue to progress the postoperative recovery, monitor voiding function.  Hosie Spangle, MD 01/10/2013, 12:03 PM

## 2013-01-13 ENCOUNTER — Other Ambulatory Visit: Payer: Self-pay | Admitting: Diagnostic Neuroimaging

## 2013-02-06 ENCOUNTER — Other Ambulatory Visit: Payer: Self-pay | Admitting: *Deleted

## 2013-02-06 ENCOUNTER — Encounter: Payer: Self-pay | Admitting: *Deleted

## 2013-02-06 MED ORDER — CLONAZEPAM 1 MG PO TABS: 1.0000 mg | ORAL_TABLET | Freq: Two times a day (BID) | ORAL | Status: DC | PRN

## 2013-02-06 MED ORDER — ESTROGENS CONJUGATED 1.25 MG PO TABS: 1.2500 mg | ORAL_TABLET | Freq: Every day | ORAL | Status: DC

## 2013-02-11 ENCOUNTER — Telehealth: Payer: Self-pay | Admitting: Family Medicine

## 2013-02-11 NOTE — Telephone Encounter (Signed)
Patient is requesting normal routine blood work for office visit.

## 2013-02-11 NOTE — Telephone Encounter (Signed)
Patient would like paper work for Blood Work to be completed.  However, Patient states her insurance no longer pays for [employer].  Please call patient.  Thanks

## 2013-02-11 NOTE — Telephone Encounter (Signed)
Call pt, find out what is needed.

## 2013-02-12 ENCOUNTER — Other Ambulatory Visit: Payer: Self-pay

## 2013-02-12 DIAGNOSIS — E782 Mixed hyperlipidemia: Secondary | ICD-10-CM

## 2013-02-12 NOTE — Telephone Encounter (Signed)
Left message on voicemail notifying patient papers ready for pickup.

## 2013-02-12 NOTE — Telephone Encounter (Signed)
Lipid profile. Rea: elevated triglyc.

## 2013-02-18 ENCOUNTER — Telehealth: Payer: Self-pay | Admitting: Family Medicine

## 2013-02-18 MED ORDER — CITALOPRAM HYDROBROMIDE 40 MG PO TABS: 40.0000 mg | ORAL_TABLET | Freq: Every day | ORAL | Status: DC

## 2013-02-18 NOTE — Telephone Encounter (Signed)
Patient needs Rx for celexa to [facility] [phone]  Patient says she needs it this morning

## 2013-02-18 NOTE — Telephone Encounter (Signed)
Rx sent electronically to [facility]. Patient notified.

## 2013-02-20 LAB — LIPID PANEL
HDL: 53 mg/dL (ref >39)
LDL Cholesterol: 191 mg/dL — ABNORMAL HIGH (ref 0–99)
Total CHOL/HDL Ratio: 5.8 Ratio
Triglycerides: 311 mg/dL — ABNORMAL HIGH (ref <150)

## 2013-02-25 ENCOUNTER — Encounter: Payer: Self-pay | Admitting: Family Medicine

## 2013-02-25 ENCOUNTER — Ambulatory Visit (INDEPENDENT_AMBULATORY_CARE_PROVIDER_SITE_OTHER): Payer: BC Managed Care – PPO | Admitting: Obstetrics & Gynecology

## 2013-02-25 ENCOUNTER — Ambulatory Visit (INDEPENDENT_AMBULATORY_CARE_PROVIDER_SITE_OTHER): Payer: BC Managed Care – PPO | Admitting: Family Medicine

## 2013-02-25 ENCOUNTER — Encounter: Payer: Self-pay | Admitting: Obstetrics & Gynecology

## 2013-02-25 VITALS — BP 118/80 | Ht 65.0 in | Wt 171.0 lb

## 2013-02-25 VITALS — BP 110/78 | HR 70 | Ht 65.0 in | Wt 169.0 lb

## 2013-02-25 DIAGNOSIS — IMO0002 Reserved for concepts with insufficient information to code with codable children: Secondary | ICD-10-CM | POA: Insufficient documentation

## 2013-02-25 DIAGNOSIS — Z79899 Other long term (current) drug therapy: Secondary | ICD-10-CM

## 2013-02-25 DIAGNOSIS — E785 Hyperlipidemia, unspecified: Secondary | ICD-10-CM

## 2013-02-25 MED ORDER — VITAMIN D3 50 MCG (2000 UT) PO TABS: 1.0000 | ORAL_TABLET | Freq: Every day | ORAL | Status: DC

## 2013-02-25 MED ORDER — ESTROGENS CONJUGATED 1.25 MG PO TABS: 1.2500 mg | ORAL_TABLET | Freq: Every day | ORAL | Status: DC

## 2013-02-25 MED ORDER — CITALOPRAM HYDROBROMIDE 40 MG PO TABS: 40.0000 mg | ORAL_TABLET | Freq: Every day | ORAL | Status: DC

## 2013-02-25 MED ORDER — TOPIRAMATE 50 MG PO TABS: 50.0000 mg | ORAL_TABLET | Freq: Two times a day (BID) | ORAL | Status: DC

## 2013-02-25 MED ORDER — BUDESONIDE-FORMOTEROL FUMARATE 160-4.5 MCG/ACT IN AERO: 2.0000 | INHALATION_SPRAY | Freq: Two times a day (BID) | RESPIRATORY_TRACT | Status: DC

## 2013-02-25 MED ORDER — ACYCLOVIR 400 MG PO TABS: 400.0000 mg | ORAL_TABLET | Freq: Every day | ORAL | Status: DC

## 2013-02-25 MED ORDER — FLUTICASONE PROPIONATE 50 MCG/ACT NA SUSP: 2.0000 | Freq: Every day | NASAL | Status: DC

## 2013-02-25 MED ORDER — MONTELUKAST SODIUM 10 MG PO TABS: 10.0000 mg | ORAL_TABLET | Freq: Every day | ORAL | Status: DC

## 2013-02-25 MED ORDER — PRAVASTATIN SODIUM 20 MG PO TABS: 20.0000 mg | ORAL_TABLET | Freq: Every evening | ORAL | Status: DC

## 2013-02-25 MED ORDER — ESTRADIOL 1 MG/GM TD GEL: 1.0000 | Freq: Every day | TRANSDERMAL | Status: DC

## 2013-02-25 NOTE — Progress Notes (Signed)
  Subjective:    Patient ID: [name], female    DOB: 09/07/1970, 42 y.o.   MRN: [id number]  HPI Here for a med check. No concerns. Had neck surgery on June 16th. Had lipid panel done. Patient under some stress because she has separated from her husband this is cause some difficulty. But hopefully she states a stress will get better in the near future she also had lab work completed showed a persistent elevation of her cholesterol we discussed this in detail. She states she felt she would not have to be on medicine the rest of her life. She is trying to eat healthy and lose weight. Past medical history hyperlipidemia hypothyroidism, stress family history noncontributory social does not smoke  Review of Systems    she denies chest pain shortness of breath swelling in the legs. Objective:   Physical Exam Her lungs are clear hearts regular pulse normal blood pressure good Lipid profile was reviewed in detail with the patient       Assessment & Plan:  Hyperlipidemia-her LDL is significantly elevated triglycerides somewhat better after long discussion she opts to be on pravastatin 20 mg one daily. We will stop fenofibrate. In addition to that she will use fish oil or flaxseed to try to help keep triglyceride down she'll also maintain a healthy diet I recommend checking a lipid liver profile in 4 weeks' time also recommend followup office visit in 4 months time

## 2013-02-25 NOTE — Progress Notes (Signed)
Patient ID: [name], female   DOB: 1970/10/09, 42 y.o.   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

## 2013-02-25 NOTE — Patient Instructions (Signed)
DASH Diet  The DASH diet stands for "Dietary Approaches to Stop Hypertension." It is a healthy eating plan that has been shown to reduce high blood pressure (hypertension) in as little as 14 days, while also possibly providing other significant health benefits. These other health benefits include reducing the risk of breast cancer after menopause and reducing the risk of type 2 diabetes, heart disease, colon cancer, and stroke. Health benefits also include weight loss and slowing kidney failure in patients with chronic kidney disease.   DIET GUIDELINES  · Limit salt (sodium). Your diet should contain less than 1500 mg of sodium daily.  · Limit refined or processed carbohydrates. Your diet should include mostly whole grains. Desserts and added sugars should be used sparingly.  · Include small amounts of heart-healthy fats. These types of fats include nuts, oils, and tub margarine. Limit saturated and trans fats. These fats have been shown to be harmful in the body.  CHOOSING FOODS   The following food groups are based on a 2000 calorie diet. See your Registered Dietitian for individual calorie needs.  Grains and Grain Products (6 to 8 servings daily)  · Eat More Often: Whole-wheat bread, brown rice, whole-grain or wheat pasta, quinoa, popcorn without added fat or salt (air popped).  · Eat Less Often: White bread, white pasta, white rice, cornbread.  Vegetables (4 to 5 servings daily)  · Eat More Often: Fresh, frozen, and canned vegetables. Vegetables may be raw, steamed, roasted, or grilled with a minimal amount of fat.  · Eat Less Often/Avoid: Creamed or fried vegetables. Vegetables in a cheese sauce.  Fruit (4 to 5 servings daily)  · Eat More Often: All fresh, canned (in natural juice), or frozen fruits. Dried fruits without added sugar. One hundred percent fruit juice (½ cup [237 mL] daily).  · Eat Less Often: Dried fruits with added sugar. Canned fruit in light or heavy syrup.  Lean Meats, Fish, and Poultry (2  servings or less daily. One serving is 3 to 4 oz [85-114 g]).  · Eat More Often: Ninety percent or leaner ground beef, tenderloin, sirloin. Round cuts of beef, chicken breast, turkey breast. All fish. Grill, bake, or broil your meat. Nothing should be fried.  · Eat Less Often/Avoid: Fatty cuts of meat, turkey, or chicken leg, thigh, or wing. Fried cuts of meat or fish.  Dairy (2 to 3 servings)  · Eat More Often: Low-fat or fat-free milk, low-fat plain or light yogurt, reduced-fat or part-skim cheese.  · Eat Less Often/Avoid: Milk (whole, 2%). Whole milk yogurt. Full-fat cheeses.  Nuts, Seeds, and Legumes (4 to 5 servings per week)  · Eat More Often: All without added salt.  · Eat Less Often/Avoid: Salted nuts and seeds, canned beans with added salt.  Fats and Sweets (limited)  · Eat More Often: Vegetable oils, tub margarines without trans fats, sugar-free gelatin. Mayonnaise and salad dressings.  · Eat Less Often/Avoid: Coconut oils, palm oils, butter, stick margarine, cream, half and half, cookies, candy, pie.  FOR MORE INFORMATION  The Dash Diet Eating Plan: www.dashdiet.org  Document Released: 06/30/2011 Document Revised: 10/03/2011 Document Reviewed: 06/30/2011  [employer]® Patient Information ©2014 [employer].

## 2013-02-25 NOTE — Patient Instructions (Signed)
Dyspareunia Dyspareunia is pain during sexual intercourse. It is most common in women, but it also happens in men.  CAUSES  Female The pain from this condition is usually felt when anything is put into the vagina, but any part of the genitals may cause pain during sex. Even sitting or wearing pants can cause pain. Sometimes, a cause cannot be found. Some causes of pain during intercourse are:  Infections of the skin around the vagina.  Vaginal infections, such as a yeast, bacterial, or viral infection.  Vaginismus. This is the inability to have anything put in the vagina even when the woman wants it to happen. There is an automatic muscle contraction and pain. The pain of the muscle contraction can be so severe that intercourse is impossible.  Allergic reaction from spermicides, semen, condoms, scented tampons, soaps, douches, and vaginal sprays.  A fluid-filled sac (cyst) on the Bartholin or Skene glands, located at the opening of the vagina.  Scar tissue in the vagina from a surgically enlarged opening (episiotomy) or tearing after delivering a baby.  Vaginal dryness. This is more common in menopause. The normal secretions of the vagina are decreased. Changes in estrogen levels and increased difficulty becoming aroused can cause painful sex. Vaginal dryness can also happen when taking birth control pills.  Thinning of the tissue (atrophy) of the vulva and vagina. This makes the area thinner, smaller, unable to stretch to accommodate a penis, and prone to infection and tearing.  Vulvar vestibulitis or vestibulodynia.This is a condition that causes pain involving the area around the entrance to the vagina.The most common cause in young women is birth control pills.Women with low estrogen levels (postmenopausal women) may also experience this.Other causes include allergic reactions, too many nerve endings, skin conditions, and pelvic muscles that cannot relax.  Vulvar dermatoses. This  includes skin conditions such as lichen sclerosus and lichen planus.  Lack of foreplay to lubricate the vagina. This can cause vaginal dryness.  Noncancerous tumors (fibroids) in the uterus.  Uterus lining tissue growing outside the uterus (endometriosis).  Pregnancy that starts in the fallopian tube (tubal pregnancy).  Pregnancy or breastfeeding your baby. This can cause vaginal dryness.  A tilting or prolapse of the uterus. Prolapse is when weak and stretched muscles around the uterus allow it to fall into the vagina.  Problems with the ovaries, cysts, or scar tissue. This may be worse with certain sexual positions.  Previous surgeries causing adhesions or scar tissue in the vagina or pelvis.  Bladder and intestinal problems.  Psychological problems (such as depression or anxiety). This may make pain worse.  Negative attitudes about sex, experiencing rape, sexual assault, and misinformation about sex. These issues are often related to some types of pain.  Previous pelvic infection, causing scar tissue in the pelvis and on the female organs.  Cyst or tumor on the ovary.  Cancer of the female organs.  Certain medicines.  Medical problems such as diabetes, arthritis, or thyroid disease. Female In men, there are many physical causes of sexual discomfort. Some causes of pain during intercourse are:  Infections of the prostate, bladder, or seminal vesicles. This can cause pain after ejaculation.  An inflamed bladder (interstitial cystitis). This may cause pain from ejaculation.  Gonorrheal infections. This may cause pain during ejaculation.  An inflamed urethra (urethritis) or inflamed prostate (prostatitis). This can make genital stimulation painful or uncomfortable.  Deformities of the penis, such as Peyronie's disease.  A tight foreskin.  Cancer of the female organs.  Psychological problems. This may make pain worse. DIAGNOSIS   Your caregiver will take a history and  have you describe where the pain is located (outside the vagina, in the vagina, in the pelvis). You may be asked when you experience pain, such as with penetration or with thrusting.  Following this, your caregiver will do a physical exam. Let your caregiver know if the exam is too painful.  During the final part of the female exam, your caregiver will feel your uterus and ovaries with one hand on the abdomen and one finger in your vagina. This is a pelvic exam.  Blood tests, a Pap test, cultures for infection, an ultrasound test, and X-rays may be done. You may need to see a specialist for female problems (gynecologist).  Your caregiver may do a CT scan, MRI, or laparoscopy. Laparoscopy is a procedure to look into the pelvis with a lighted tube, through a cut (incision) in the abdomen. TREATMENT  Your caregiver can help you determine the best course of treatment. Sometimes, more testing is done. Continue with the suggested testing until your caregiver feels sure about your diagnosis and how to treat it. Sometimes, it is difficult to find the reason for the pain. The search for the cause and treatment can be frustrating. Treatment often takes several weeks to a few months before you notice any improvement. You may also need to avoid sexual activity until symptoms improve.Continuing to have sex when it hurts can delay healing and actually make the problem worse. The treatment depends on the cause of the pain. Treatment may include:  Medicines such as antibiotics, vaginal or skin creams, hormones, or antidepressants.  Minor or major surgery.  Psychological counseling or group therapy.  Kegel exercises and vaginal dilators to help certain cases of vaginismus (spasms). Do this only if recommended by your caregiver.Kegel exercises can make some problems worse.  Applying lubrication as recommended by your caregiver if you have dryness.  Sex therapy for you and your sex partner. It is common for  the pain to continue after the reason for the pain has been treated. Some reasons for this include a conditioned response. This means the person having the pain becomes so familiar with the pain that the pain continues as a response, even though the cause is removed. Sex therapy can help with this problem. HOME CARE INSTRUCTIONS   Follow your caregiver's instructions about taking medicines, tests, counseling, and follow-up treatment.  Do not use scented tampons, douches, vaginal sprays, or soaps.  Use water-based lubricants for dryness. Oil lubricants can cause irritation.  Do not use spermicides or condoms that irritate you.  Openly discuss with your partner your sexual experience, your desires, foreplay, and different sexual positions for a more comfortable and enjoyable sexual relationship.  Join group sessions for therapy, if needed.  Practice safe sex at all times.  Empty your bladder before having intercourse.  Try different positions during sexual intercourse.  Take over-the-counter pain medicine recommended by your caregiver before having sexual intercourse.  Do not wear pantyhose. Knee-high and thigh-high hose are okay.  Avoid scrubbing your vulva with a washcloth. Wash the area gently and pat dry with a towel. SEEK MEDICAL CARE IF:   You develop vaginal bleeding after sexual intercourse.  You develop a lump at the opening of your vagina, even if it is not painful.  You have abnormal vaginal discharge.  You have vaginal dryness.  You have itching or irritation of the vulva or vagina.  You  develop a rash or reaction to your medicine. SEEK IMMEDIATE MEDICAL CARE IF:   You develop severe abdominal pain during or shortly after sexual intercourse. You could have a ruptured ovarian cyst or ruptured tubal pregnancy.  You have a fever.  You have painful or bloody urination.  You have painful sexual intercourse, and you never had it before.  You pass out after having  sexual intercourse. Document Released: 07/31/2007 Document Revised: 10/03/2011 Document Reviewed: 10/11/2010 [facility] Patient Information 2014 [place], [place].

## 2013-03-13 ENCOUNTER — Ambulatory Visit (INDEPENDENT_AMBULATORY_CARE_PROVIDER_SITE_OTHER): Payer: BC Managed Care – PPO | Admitting: Family Medicine

## 2013-03-13 ENCOUNTER — Encounter: Payer: Self-pay | Admitting: Family Medicine

## 2013-03-13 VITALS — BP 110/78 | Ht 65.0 in | Wt 169.0 lb

## 2013-03-13 DIAGNOSIS — R300 Dysuria: Secondary | ICD-10-CM

## 2013-03-13 LAB — POCT URINALYSIS DIPSTICK: pH, UA: 5

## 2013-03-13 MED ORDER — CEFPROZIL 500 MG PO TABS: 500.0000 mg | ORAL_TABLET | Freq: Two times a day (BID) | ORAL | Status: DC

## 2013-03-13 NOTE — Progress Notes (Signed)
  Subjective:    Patient ID: [name], female    DOB: 06/08/1971, 42 y.o.   MRN: [id number]  Dysuria  This is a new problem. The current episode started 1 to 4 weeks ago. The quality of the pain is described as burning. Associated symptoms include frequency and urgency.   Patient does have a new sexual partner although she relates it's first-time she's had intercourse in a couple years and relates that her partner it's first-time in a few years for him I still think it's a good idea to screen for GC Chlamydia   Review of Systems  Genitourinary: Positive for dysuria, urgency and frequency.       Objective:   Physical Exam Lungs are clear hearts regular abdomen soft pulse normal Urinalysis trace WBCs       Assessment & Plan:  UTI-antibiotics prescribed. Also culture and GC Chlamydia ordered followup if ongoing troubles.

## 2013-03-14 LAB — GC/CHLAMYDIA PROBE AMP, URINE
Chlamydia, Swab/Urine, PCR: NEGATIVE
GC Probe Amp, Urine: NEGATIVE

## 2013-04-02 ENCOUNTER — Ambulatory Visit: Payer: BC Managed Care – PPO | Admitting: Family Medicine

## 2013-04-03 ENCOUNTER — Other Ambulatory Visit: Payer: Self-pay | Admitting: *Deleted

## 2013-04-03 ENCOUNTER — Telehealth: Payer: Self-pay | Admitting: Family Medicine

## 2013-04-03 DIAGNOSIS — Z79899 Other long term (current) drug therapy: Secondary | ICD-10-CM

## 2013-04-03 DIAGNOSIS — E785 Hyperlipidemia, unspecified: Secondary | ICD-10-CM

## 2013-04-03 NOTE — Telephone Encounter (Signed)
Pt has to have her bloodwork done at [facility] not [facility] bc of insurance. Discussed with pt i will mail new bw papers so she can do at [facility].

## 2013-04-03 NOTE — Telephone Encounter (Signed)
Patient needs a nurse to call her back. She says she was given the wrong paperwork for bloodwork

## 2013-04-09 ENCOUNTER — Encounter: Payer: Self-pay | Admitting: Family Medicine

## 2013-04-09 ENCOUNTER — Ambulatory Visit (INDEPENDENT_AMBULATORY_CARE_PROVIDER_SITE_OTHER): Payer: BC Managed Care – PPO | Admitting: Family Medicine

## 2013-04-09 VITALS — BP 128/70 | Ht 65.0 in | Wt 174.0 lb

## 2013-04-09 DIAGNOSIS — E785 Hyperlipidemia, unspecified: Secondary | ICD-10-CM

## 2013-04-09 DIAGNOSIS — K760 Fatty (change of) liver, not elsewhere classified: Secondary | ICD-10-CM

## 2013-04-09 DIAGNOSIS — K7689 Other specified diseases of liver: Secondary | ICD-10-CM

## 2013-04-09 NOTE — Progress Notes (Signed)
  Subjective:    Patient ID: [name], female    DOB: [birth year], 42 y.o.   MRN: [id number]  HPIHere for a follow up on bloodwood. No concerns. This patient has had recent lab work. She comes in to go overall of this. Her moods are overall doing fairly good she somewhat stressed because of her liver functions being mildly elevated. She was diagnosed with fatty liver back in 2011 she does not remember the specialist she seen in our notes relate nothing at that time. Patient does not Ms. use alcohol. She tries to stay healthy she does have some significant history of elevated triglycerides as well as weight issues her dad had cirrhosis but he was an alcoholic.  She does have a history of a complex renal cyst on the left side she has had some intermittent aches on the left as well as on the right Review of Systems See above no abdominal pain no vomiting no diarrhea.    Objective:   Physical Exam Lungs are clear no crackles heart is regular pulse normal abdomen soft no masses no tenderness       Assessment & Plan:  #1 hyperlipidemia-liver enzymes have gone up slightly we will hold off on pravastatin until gastroenterology states it is okay to use this I did recommend healthy diet and exercise  #2 fatty liver ultrasound lab work ordered plus also referral to gastroenterology to make sure there is not an obscure cause to her fatty liver. Importance of getting her weight under control exercise healthy diet were all discussed in detail.

## 2013-04-16 ENCOUNTER — Ambulatory Visit (HOSPITAL_COMMUNITY)
Admission: RE | Admit: 2013-04-16 | Discharge: 2013-04-16 | Disposition: A | Discharge status: Home / Self Care | Payer: BC Managed Care – PPO | Source: Ambulatory Visit | Attending: Family Medicine | Admitting: Family Medicine

## 2013-04-16 DIAGNOSIS — N289 Disorder of kidney and ureter, unspecified: Secondary | ICD-10-CM | POA: Insufficient documentation

## 2013-04-16 DIAGNOSIS — K7689 Other specified diseases of liver: Secondary | ICD-10-CM | POA: Insufficient documentation

## 2013-04-16 DIAGNOSIS — R109 Unspecified abdominal pain: Secondary | ICD-10-CM | POA: Insufficient documentation

## 2013-04-18 ENCOUNTER — Other Ambulatory Visit: Payer: Self-pay | Admitting: Family Medicine

## 2013-04-18 ENCOUNTER — Other Ambulatory Visit: Payer: Self-pay

## 2013-04-18 DIAGNOSIS — N281 Cyst of kidney, acquired: Secondary | ICD-10-CM

## 2013-04-26 ENCOUNTER — Ambulatory Visit (HOSPITAL_COMMUNITY): Payer: BC Managed Care – PPO

## 2013-05-03 ENCOUNTER — Telehealth: Payer: Self-pay | Admitting: Family Medicine

## 2013-05-03 NOTE — Telephone Encounter (Signed)
Patient would like to know results to her MRI she had done on Friday of last week.

## 2013-05-03 NOTE — Telephone Encounter (Signed)
See results

## 2013-05-03 NOTE — Telephone Encounter (Signed)
Notified patient of results. Patient transferred to front desk to schedule office visit per her request.

## 2013-05-09 ENCOUNTER — Encounter: Payer: Self-pay | Admitting: Family Medicine

## 2013-05-09 ENCOUNTER — Ambulatory Visit (INDEPENDENT_AMBULATORY_CARE_PROVIDER_SITE_OTHER): Payer: BC Managed Care – PPO | Admitting: Family Medicine

## 2013-05-09 VITALS — BP 130/74 | Temp 98.3°F | Ht 65.0 in | Wt 181.0 lb

## 2013-05-09 DIAGNOSIS — E669 Obesity, unspecified: Secondary | ICD-10-CM

## 2013-05-09 DIAGNOSIS — Q619 Cystic kidney disease, unspecified: Secondary | ICD-10-CM

## 2013-05-09 DIAGNOSIS — Z23 Encounter for immunization: Secondary | ICD-10-CM

## 2013-05-09 DIAGNOSIS — N281 Cyst of kidney, acquired: Secondary | ICD-10-CM

## 2013-05-09 DIAGNOSIS — R109 Unspecified abdominal pain: Secondary | ICD-10-CM

## 2013-05-09 LAB — POCT URINALYSIS DIPSTICK
Spec Grav, UA: 1.01
pH, UA: 7

## 2013-05-09 MED ORDER — PHENTERMINE HCL 37.5 MG PO CAPS: 37.5000 mg | ORAL_CAPSULE | ORAL | Status: DC

## 2013-05-09 NOTE — Progress Notes (Signed)
  Subjective:    Patient ID: [name], female    DOB: 14-Feb-1971, 42 y.o.   MRN: [id number]  HPIResults of MRI. -MRI results were reviewed in detail. It showed simple cysts in the kidney. No sign of any type of cancer. No need for followup.  Having Kidney pain. -Patient with slight dysuria urinary frequency symptoms over the past few days has history of stones.  Patient is also concerned about significant weight gain that she's had over the past several months she states she's keeping all of her diet same exercise same yet she is gaining weight this frustrates her. She denies any other particular type of underlying problem or illness. She has had a hysterectomy both ovaries have been removed. She wonders if she is gaining fluid. Yet she has no fluid around her ankles. She has had thyroid testing earlier this year which looked good.  PMH as per above please see previous notes Family history noncontributory  Review of Systems Patient denies rectal bleeding denies vomiting diarrhea abdominal pain denies chest tightness pressure pain shortness breath denies hematuria. No fevers or chills.    Objective:   Physical Exam  Neck no masses, lungs are clear no crackles respiratory rate normal, heart regular no murmurs, blood pressure good pulse normal extremities no edema skin warm dry abdomen soft mild obesity no tenderness guarding or rebound or masses.      Assessment & Plan:  #1 weight gain-I find no evidence of any underlying disease currently, I. have advised patient I think this is a slow down in her metabolism I don't recommend rechecking thyroid function at this present time. Phentermine 37.5 mg one every morning may be tried over the next 4 months. She is to followup in 4-6 months. She was encouraged to eat a low calorie diet, do a food diary, daily physical activity 30-60 minutes  #2 simple renal cysts no sign of any type of cancers does not need to be repeated.  #3 dysuria a urine  looks good under the microscope we will send it for culture await the results. No lab work indicated today. 25-30 minutes spent with patient discussing the above issues.

## 2013-05-11 LAB — URINE CULTURE: Colony Count: 50000

## 2013-05-20 ENCOUNTER — Other Ambulatory Visit: Payer: Self-pay | Admitting: *Deleted

## 2013-05-20 MED ORDER — SULFAMETHOXAZOLE-TMP DS 800-160 MG PO TABS: 1.0000 | ORAL_TABLET | Freq: Two times a day (BID) | ORAL | Status: DC

## 2013-06-06 ENCOUNTER — Telehealth: Payer: Self-pay | Admitting: Family Medicine

## 2013-06-06 NOTE — Telephone Encounter (Signed)
Patient needs MRI of abdomen to Urologist Dr Karsten Ro

## 2013-06-06 NOTE — Telephone Encounter (Signed)
Faxed

## 2013-06-06 NOTE — Telephone Encounter (Signed)
done

## 2013-06-19 ENCOUNTER — Other Ambulatory Visit: Payer: Self-pay | Admitting: Family Medicine

## 2013-07-22 ENCOUNTER — Telehealth: Payer: Self-pay | Admitting: Family Medicine

## 2013-07-22 MED ORDER — OSELTAMIVIR PHOSPHATE 6 MG/ML PO SUSR: ORAL | Status: DC

## 2013-07-22 NOTE — Telephone Encounter (Signed)
tamiflu 150 bid for five d

## 2013-07-22 NOTE — Telephone Encounter (Signed)
Patient has been around the flu for the past week. Her boyfriend has just been diagnosed with it. She is having body aches, fever, headache, cough, some congestion.  She would like Rx for tamiflu to [facility]

## 2013-07-22 NOTE — Telephone Encounter (Signed)
Med sent to pharm. Pt notified on voicemail.  

## 2013-08-20 ENCOUNTER — Other Ambulatory Visit: Payer: Self-pay | Admitting: Family Medicine

## 2013-09-23 ENCOUNTER — Other Ambulatory Visit: Payer: Self-pay | Admitting: Family Medicine

## 2013-09-24 NOTE — Telephone Encounter (Signed)
Seen 10/16

## 2013-09-25 NOTE — Telephone Encounter (Signed)
Ok times one needs spring OV

## 2013-10-17 ENCOUNTER — Telehealth: Payer: Self-pay | Admitting: Obstetrics & Gynecology

## 2013-10-17 NOTE — Telephone Encounter (Signed)
Pt states stopped Ditropan XL wants to restart, pt informed will need an appt. Call transferred to front staff for an appt to be made with Dr. Elonda Husky.

## 2013-10-18 ENCOUNTER — Encounter: Payer: Self-pay | Admitting: Obstetrics & Gynecology

## 2013-10-18 ENCOUNTER — Ambulatory Visit (INDEPENDENT_AMBULATORY_CARE_PROVIDER_SITE_OTHER): Payer: BC Managed Care – PPO | Admitting: Obstetrics & Gynecology

## 2013-10-18 VITALS — BP 110/80 | Ht 65.0 in | Wt 193.0 lb

## 2013-10-18 DIAGNOSIS — N3941 Urge incontinence: Secondary | ICD-10-CM

## 2013-10-18 MED ORDER — MIRABEGRON ER 50 MG PO TB24: 50.0000 mg | ORAL_TABLET | Freq: Every day | ORAL | Status: DC

## 2013-10-18 NOTE — Progress Notes (Signed)
Patient ID: [name], female   DOB: 1970-09-02, 43 y.o.   MRN: [id number] Bonne Dolores being off of Ditropan XL due to neck surgery No so much  Incontinence is signifcant ithout it  Change to myrbetriq 50 qhs  Past Medical History  Diagnosis Date  . IBS (irritable bowel syndrome)   . Overactive bladder   . High cholesterol   . Migraine   . Anxiety   . Hypertension   . Reactive airway disease   . Fatty liver   . Asthma     exercise induced  . Sleep apnea     uses cpap occ  . Personal history of kidney stones   . Renal cyst   . GERD (gastroesophageal reflux disease)     occ  . Anemia     hx  . Hx of abdominoplasty     Past Surgical History  Procedure Laterality Date  . Cesarean section    . Total abdominal hysterectomy w/ bilateral salpingoophorectomy    . Cervical disc surgery  07,11  . Breast surgery  09    breast red   . Posterior cervical fusion/foraminotomy N/A 01/07/2013    Procedure: POSTERIOR CERVICAL FUSION/FORAMINOTOMY LEVEL 2;  Surgeon: Hosie Spangle, MD;  Location: [facility];  Service: Neurosurgery;  Laterality: N/A;  Posterior Cervical Five-Six/Six-Seven Arthrodesis with Instrumentation    OB History   Grav Para Term Preterm Abortions TAB SAB Ect Mult Living                  Allergies  Allergen Reactions  . Levaquin [Levofloxacin In D5w]     N/v/d    History   Social History  . Marital Status: Married    Spouse Name: N/A    Number of Children: N/A  . Years of Education: N/A   Social History Main Topics  . Smoking status: Never Smoker   . Smokeless tobacco: None     Comment: oc alcohol  . Alcohol Use: Yes  . Drug Use: No  . Sexual Activity: Yes   Other Topics Concern  . None   Social History Narrative  . None    Family History  Problem Relation Age of Onset  . Cirrhosis Father   . Diabetes Father   . Hypertension Father   . Cancer Other   . Hypertension Mother

## 2013-11-22 ENCOUNTER — Telehealth: Payer: Self-pay | Admitting: Family Medicine

## 2013-11-22 NOTE — Telephone Encounter (Signed)
Pt's insurance does not cover Symbicort.  Plan covers [place], if ok to change, please send in [facility]

## 2013-11-24 NOTE — Telephone Encounter (Signed)
Nurses-let the patient know that her medication is not covered by her insurance but add Advair is. Therefore we can send an Advair disc 250/50 one inhalation twice a day we'll rinse after use. This would take the place of Symbicort. 4 refills on Advair

## 2013-11-25 MED ORDER — FLUTICASONE-SALMETEROL 250-50 MCG/DOSE IN AEPB: 1.0000 | INHALATION_SPRAY | Freq: Two times a day (BID) | RESPIRATORY_TRACT | Status: DC

## 2013-11-25 NOTE — Telephone Encounter (Signed)
Discussed with patient. Med sent to pharm.

## 2013-12-05 ENCOUNTER — Ambulatory Visit (INDEPENDENT_AMBULATORY_CARE_PROVIDER_SITE_OTHER): Payer: BC Managed Care – PPO | Admitting: Family Medicine

## 2013-12-05 ENCOUNTER — Encounter: Payer: Self-pay | Admitting: Family Medicine

## 2013-12-05 VITALS — BP 122/78 | Ht 65.0 in | Wt 198.4 lb

## 2013-12-05 DIAGNOSIS — R635 Abnormal weight gain: Secondary | ICD-10-CM

## 2013-12-05 DIAGNOSIS — Z0189 Encounter for other specified special examinations: Secondary | ICD-10-CM

## 2013-12-05 DIAGNOSIS — E669 Obesity, unspecified: Secondary | ICD-10-CM

## 2013-12-05 DIAGNOSIS — K7689 Other specified diseases of liver: Secondary | ICD-10-CM

## 2013-12-05 LAB — POCT URINALYSIS DIPSTICK
Spec Grav, UA: 1.01
pH, UA: 6

## 2013-12-05 MED ORDER — FLUTICASONE-SALMETEROL 250-50 MCG/DOSE IN AEPB: 1.0000 | INHALATION_SPRAY | Freq: Two times a day (BID) | RESPIRATORY_TRACT | Status: DC

## 2013-12-05 NOTE — Progress Notes (Signed)
   Subjective:    Patient ID: [name], female    DOB: 1971-07-25, 43 y.o.   MRN: [id number]  Hypertension This is a chronic problem. The current episode started more than 1 year ago. The problem is unchanged. The problem is controlled. Pertinent negatives include no chest pain or shortness of breath. There are no associated agents to hypertension. There are no known risk factors for coronary artery disease. Past treatments include nothing. The current treatment provides significant improvement. There are no compliance problems.    Patient wants urine checked. Patient denies any dysuria. Patient is concerned about her weight. She relates she would like to try to lose weight. She had a couple different medicines in mind given her various health problems I like to look into various We will look into these medications and side effects. It is in the patient's best interest to lose significant amount await Review of Systems  Constitutional: Negative for fever and activity change.  HENT: Negative for congestion, ear pain and rhinorrhea.   Eyes: Negative for discharge.  Respiratory: Negative for cough, shortness of breath and wheezing.   Cardiovascular: Negative for chest pain.       Objective:   Physical Exam  Vitals reviewed. Constitutional: She appears well-nourished. No distress.  Cardiovascular: Normal rate, regular rhythm and normal heart sounds.   No murmur heard. Pulmonary/Chest: Effort normal and breath sounds normal. No respiratory distress.  Musculoskeletal: She exhibits no edema.  Lymphadenopathy:    She has no cervical adenopathy.  Neurological: She is alert. She exhibits normal muscle tone.  Psychiatric: Her behavior is normal.          Assessment & Plan:  1. Other specified examination No sign urinary tract infection currently - POCT urinalysis dipstick - Basic metabolic panel - Hemoglobin [name]  2. FATTY LIVER DISEASE She has fatty liver disease trying to  lose weight would be best for her I do recommend that she get liver profile.  3. Obesity, unspecified Lipid panel recommended, weight loss recommended, healthy eating, exercise recommended. - Lipid panel  4. Weight gain Thyroid dysfunction is a possibility we need to look at her thyroid testing we will also look at medications I will call her back regarding that. - TSH

## 2013-12-05 NOTE — Patient Instructions (Signed)
DASH Diet  The DASH diet stands for "Dietary Approaches to Stop Hypertension." It is a healthy eating plan that has been shown to reduce high blood pressure (hypertension) in as little as 14 days, while also possibly providing other significant health benefits. These other health benefits include reducing the risk of breast cancer after menopause and reducing the risk of type 2 diabetes, heart disease, colon cancer, and stroke. Health benefits also include weight loss and slowing kidney failure in patients with chronic kidney disease.   DIET GUIDELINES  · Limit salt (sodium). Your diet should contain less than 1500 mg of sodium daily.  · Limit refined or processed carbohydrates. Your diet should include mostly whole grains. Desserts and added sugars should be used sparingly.  · Include small amounts of heart-healthy fats. These types of fats include nuts, oils, and tub margarine. Limit saturated and trans fats. These fats have been shown to be harmful in the body.  CHOOSING FOODS   The following food groups are based on a 2000 calorie diet. See your Registered Dietitian for individual calorie needs.  Grains and Grain Products (6 to 8 servings daily)  · Eat More Often: Whole-wheat bread, brown rice, whole-grain or wheat pasta, quinoa, popcorn without added fat or salt (air popped).  · Eat Less Often: White bread, white pasta, white rice, cornbread.  Vegetables (4 to 5 servings daily)  · Eat More Often: Fresh, frozen, and canned vegetables. Vegetables may be raw, steamed, roasted, or grilled with a minimal amount of fat.  · Eat Less Often/Avoid: Creamed or fried vegetables. Vegetables in a cheese sauce.  Fruit (4 to 5 servings daily)  · Eat More Often: All fresh, canned (in natural juice), or frozen fruits. Dried fruits without added sugar. One hundred percent fruit juice (½ cup [237 mL] daily).  · Eat Less Often: Dried fruits with added sugar. Canned fruit in light or heavy syrup.  Lean Meats, Fish, and Poultry (2  servings or less daily. One serving is 3 to 4 oz [85-114 g]).  · Eat More Often: Ninety percent or leaner ground beef, tenderloin, sirloin. Round cuts of beef, chicken breast, turkey breast. All fish. Grill, bake, or broil your meat. Nothing should be fried.  · Eat Less Often/Avoid: Fatty cuts of meat, turkey, or chicken leg, thigh, or wing. Fried cuts of meat or fish.  Dairy (2 to 3 servings)  · Eat More Often: Low-fat or fat-free milk, low-fat plain or light yogurt, reduced-fat or part-skim cheese.  · Eat Less Often/Avoid: Milk (whole, 2%). Whole milk yogurt. Full-fat cheeses.  Nuts, Seeds, and Legumes (4 to 5 servings per week)  · Eat More Often: All without added salt.  · Eat Less Often/Avoid: Salted nuts and seeds, canned beans with added salt.  Fats and Sweets (limited)  · Eat More Often: Vegetable oils, tub margarines without trans fats, sugar-free gelatin. Mayonnaise and salad dressings.  · Eat Less Often/Avoid: Coconut oils, palm oils, butter, stick margarine, cream, half and half, cookies, candy, pie.  FOR MORE INFORMATION  The Dash Diet Eating Plan: www.dashdiet.org  Document Released: 06/30/2011 Document Revised: 10/03/2011 Document Reviewed: 06/30/2011  [employer]® Patient Information ©2014 [employer].

## 2013-12-13 ENCOUNTER — Telehealth: Payer: Self-pay | Admitting: Family Medicine

## 2013-12-13 DIAGNOSIS — R5383 Other fatigue: Secondary | ICD-10-CM

## 2013-12-13 DIAGNOSIS — R5381 Other malaise: Secondary | ICD-10-CM

## 2013-12-13 DIAGNOSIS — Z79899 Other long term (current) drug therapy: Secondary | ICD-10-CM

## 2013-12-13 NOTE — Telephone Encounter (Signed)
Results are at nurses station

## 2013-12-13 NOTE — Telephone Encounter (Signed)
Patient calling to get results to her recent blood work that she had done through [facility]. Also, checking to see if Dr. Nicki Reaper had figured out her meds based on these results?

## 2013-12-13 NOTE — Telephone Encounter (Signed)
Notified patient that cholesterol is abnormal and lab work shows prediabetes per Dr. Nicki Reaper. Patient agrees to start on the Pravastatin and Metformin you recommended. [facility]. I am mailing patient another lab work order to have her Liver panel and TSH done since it was drawn the first time. Patient is aware of this.

## 2013-12-14 ENCOUNTER — Other Ambulatory Visit: Payer: Self-pay | Admitting: Family Medicine

## 2013-12-16 NOTE — Telephone Encounter (Signed)
May send in metformin 500 mg 1 twice a day, #60, 5 refills. Pravastatin 20 mg, 1 daily, #30, 5 refills.

## 2013-12-17 MED ORDER — NALTREXONE-BUPROPION HCL ER 8-90 MG PO TB12
ORAL_TABLET | ORAL | Status: DC
Start: 2013-12-17 — End: 2014-03-19

## 2013-12-17 NOTE — Telephone Encounter (Signed)
Patient notified and verbalized understanding. 

## 2013-12-17 NOTE — Telephone Encounter (Signed)
On telephone message over the weekend and I authorized metformin as well as pravastatin 20 mg #30, 5 refills.

## 2013-12-17 NOTE — Telephone Encounter (Signed)
Med not on med list

## 2013-12-17 NOTE — Telephone Encounter (Signed)
I assume that she wants a prescription. Contrave 8 mg/90 mg. #120. 3 refills. 2 tablets in the morning 2 tablets in the evening. Patient should gradually titrate up to this dose over 7 days, she should followup in 3 months. According to the manufacturer's recommendation and she has not lost 5% of her weight in 3 months then she needs to stop the medication. Also if any bad side effects suicidal ideation or other problems immediately stop medicine

## 2013-12-17 NOTE — Telephone Encounter (Signed)
What do you want as the dose, frequency, and quantity?

## 2013-12-17 NOTE — Telephone Encounter (Signed)
Please let the patient know that I did research her medication that she asked about her weight loss. It does appear that she could use this. In 20% of people they have nausea with the medicine and often have to stop it. Also there is a warning on the medicine that if a person starts feeling depressed or suicidal while on the medicine to immediately stop the medicine notify us. Please asked patient if she wants a medication called in or for [place] to prevent a prescription and mail it to her. Contrave 8/90. (Typically a person starts off with one per day and gradually increases over several days to taking 2 tablets in the morning 2 in the evening)

## 2013-12-20 LAB — HEPATIC FUNCTION PANEL
ALK PHOS: 70 U/L (ref 39–117)
ALT: 144 U/L — ABNORMAL HIGH (ref 0–35)
AST: 163 U/L — ABNORMAL HIGH (ref 0–37)
Albumin: 4.1 g/dL (ref 3.5–5.2)
BILIRUBIN DIRECT: 0.1 mg/dL (ref 0.0–0.3)
BILIRUBIN INDIRECT: 0.6 mg/dL (ref 0.2–1.2)
TOTAL PROTEIN: 6.7 g/dL (ref 6.0–8.3)
Total Bilirubin: 0.7 mg/dL (ref 0.2–1.2)

## 2013-12-20 LAB — TSH: TSH: 1.05 u[IU]/mL (ref 0.350–4.500)

## 2013-12-23 ENCOUNTER — Telehealth: Payer: Self-pay | Admitting: Family Medicine

## 2013-12-23 NOTE — Telephone Encounter (Signed)
Her triglycerides are moderately elevated. Eating fish in the diet once or twice a week would help. Low starch diet. Weight loss. Hopefully the medication we prescribed earlier will help her with weight loss. It would be wise for the patient to repeat cholesterol profile along with liver enzymes in 3 months time and then followup. She may call late August or early September so that we will order the blood work then it would be wise for her to do the blood work before her office visit so we can discuss the results of the all the blood work in detail at that time in office visit in

## 2013-12-23 NOTE — Telephone Encounter (Signed)
Patient had blood work done at [facility].

## 2013-12-24 NOTE — Telephone Encounter (Signed)
Discussed with patient

## 2014-01-04 ENCOUNTER — Other Ambulatory Visit: Payer: Self-pay | Admitting: Family Medicine

## 2014-01-15 ENCOUNTER — Other Ambulatory Visit: Payer: Self-pay | Admitting: Family Medicine

## 2014-01-19 NOTE — Telephone Encounter (Signed)
Refill this +4 additional refills

## 2014-01-21 ENCOUNTER — Other Ambulatory Visit: Payer: Self-pay

## 2014-01-21 MED ORDER — CLONAZEPAM 1 MG PO TABS: ORAL_TABLET | ORAL | Status: DC

## 2014-02-04 DIAGNOSIS — R202 Paresthesia of skin: Secondary | ICD-10-CM | POA: Insufficient documentation

## 2014-02-13 ENCOUNTER — Other Ambulatory Visit: Payer: Self-pay | Admitting: Family Medicine

## 2014-03-02 ENCOUNTER — Other Ambulatory Visit: Payer: Self-pay | Admitting: Obstetrics & Gynecology

## 2014-03-05 DIAGNOSIS — M47816 Spondylosis without myelopathy or radiculopathy, lumbar region: Secondary | ICD-10-CM | POA: Insufficient documentation

## 2014-03-05 DIAGNOSIS — M5136 Other intervertebral disc degeneration, lumbar region: Secondary | ICD-10-CM | POA: Insufficient documentation

## 2014-03-06 ENCOUNTER — Other Ambulatory Visit: Payer: Self-pay | Admitting: *Deleted

## 2014-03-06 ENCOUNTER — Telehealth: Payer: Self-pay | Admitting: Family Medicine

## 2014-03-06 DIAGNOSIS — R5381 Other malaise: Secondary | ICD-10-CM

## 2014-03-06 DIAGNOSIS — E785 Hyperlipidemia, unspecified: Secondary | ICD-10-CM

## 2014-03-06 DIAGNOSIS — Z79899 Other long term (current) drug therapy: Secondary | ICD-10-CM

## 2014-03-06 DIAGNOSIS — R7301 Impaired fasting glucose: Secondary | ICD-10-CM

## 2014-03-06 DIAGNOSIS — R5383 Other fatigue: Secondary | ICD-10-CM

## 2014-03-06 MED ORDER — FLUTICASONE PROPIONATE 50 MCG/ACT NA SUSP: NASAL | Status: DC

## 2014-03-06 NOTE — Telephone Encounter (Signed)
Please reorder these labs and send it to her. Very important for her to do these and to followup for an office visit

## 2014-03-06 NOTE — Telephone Encounter (Signed)
Patient had Lipid, Liver, TSH, met 7 and HgA1c in 5/15

## 2014-03-06 NOTE — Telephone Encounter (Signed)
Patient said that she needs her blood work paperwork mailed to her. I verified address.

## 2014-03-06 NOTE — Telephone Encounter (Signed)
bloodwork orders ready to be mailed. Pt notified.

## 2014-03-14 LAB — HEPATIC FUNCTION PANEL
ALBUMIN: 4.7 g/dL (ref 3.5–5.2)
ALT: 45 U/L — AB (ref 0–35)
AST: 45 U/L — ABNORMAL HIGH (ref 0–37)
Alkaline Phosphatase: 73 U/L (ref 39–117)
BILIRUBIN INDIRECT: 0.6 mg/dL (ref 0.2–1.2)
Bilirubin, Direct: 0.1 mg/dL (ref 0.0–0.3)
TOTAL PROTEIN: 7.7 g/dL (ref 6.0–8.3)
Total Bilirubin: 0.7 mg/dL (ref 0.2–1.2)

## 2014-03-14 LAB — BASIC METABOLIC PANEL
BUN: 11 mg/dL (ref 6–23)
CALCIUM: 9.4 mg/dL (ref 8.4–10.5)
CO2: 25 mEq/L (ref 19–32)
Chloride: 103 mEq/L (ref 96–112)
Creat: 1.1 mg/dL (ref 0.50–1.10)
GLUCOSE: 92 mg/dL (ref 70–99)
Potassium: 4.7 mEq/L (ref 3.5–5.3)
SODIUM: 138 meq/L (ref 135–145)

## 2014-03-14 LAB — LIPID PANEL
CHOL/HDL RATIO: 3.7 ratio
Cholesterol: 224 mg/dL — ABNORMAL HIGH (ref 0–200)
HDL: 60 mg/dL (ref >39)
LDL CALC: 124 mg/dL — AB (ref 0–99)
TRIGLYCERIDES: 199 mg/dL — AB (ref <150)
VLDL: 40 mg/dL (ref 0–40)

## 2014-03-14 LAB — HEMOGLOBIN A1C
Hgb A1c MFr Bld: 5.5 % (ref <5.7)
MEAN PLASMA GLUCOSE: 111 mg/dL (ref <117)

## 2014-03-14 LAB — TSH: TSH: 0.908 u[IU]/mL (ref 0.350–4.500)

## 2014-03-19 ENCOUNTER — Ambulatory Visit (INDEPENDENT_AMBULATORY_CARE_PROVIDER_SITE_OTHER): Payer: BC Managed Care – PPO | Admitting: Family Medicine

## 2014-03-19 ENCOUNTER — Encounter: Payer: Self-pay | Admitting: Family Medicine

## 2014-03-19 VITALS — BP 118/78 | Ht 65.0 in | Wt 168.0 lb

## 2014-03-19 DIAGNOSIS — I10 Essential (primary) hypertension: Secondary | ICD-10-CM

## 2014-03-19 DIAGNOSIS — K7689 Other specified diseases of liver: Secondary | ICD-10-CM

## 2014-03-19 DIAGNOSIS — Z8639 Personal history of other endocrine, nutritional and metabolic disease: Secondary | ICD-10-CM

## 2014-03-19 DIAGNOSIS — R739 Hyperglycemia, unspecified: Secondary | ICD-10-CM

## 2014-03-19 DIAGNOSIS — R7309 Other abnormal glucose: Secondary | ICD-10-CM

## 2014-03-19 DIAGNOSIS — Z862 Personal history of diseases of the blood and blood-forming organs and certain disorders involving the immune mechanism: Secondary | ICD-10-CM

## 2014-03-19 DIAGNOSIS — E785 Hyperlipidemia, unspecified: Secondary | ICD-10-CM

## 2014-03-19 MED ORDER — TOPIRAMATE 50 MG PO TABS: 50.0000 mg | ORAL_TABLET | Freq: Two times a day (BID) | ORAL | Status: DC

## 2014-03-19 MED ORDER — PRAVASTATIN SODIUM 20 MG PO TABS: ORAL_TABLET | ORAL | Status: DC

## 2014-03-19 MED ORDER — FLUTICASONE PROPIONATE 50 MCG/ACT NA SUSP: NASAL | Status: DC

## 2014-03-19 MED ORDER — MONTELUKAST SODIUM 10 MG PO TABS: 10.0000 mg | ORAL_TABLET | Freq: Every day | ORAL | Status: DC

## 2014-03-19 MED ORDER — CITALOPRAM HYDROBROMIDE 40 MG PO TABS: ORAL_TABLET | ORAL | Status: DC

## 2014-03-19 MED ORDER — ESTROGENS CONJUGATED 1.25 MG PO TABS: ORAL_TABLET | ORAL | Status: DC

## 2014-03-19 MED ORDER — ACYCLOVIR 400 MG PO TABS: ORAL_TABLET | ORAL | Status: DC

## 2014-03-19 MED ORDER — NALTREXONE-BUPROPION HCL ER 8-90 MG PO TB12: ORAL_TABLET | ORAL | Status: DC

## 2014-03-19 MED ORDER — FLUTICASONE-SALMETEROL 250-50 MCG/DOSE IN AEPB: 1.0000 | INHALATION_SPRAY | Freq: Two times a day (BID) | RESPIRATORY_TRACT | Status: DC

## 2014-03-19 NOTE — Progress Notes (Signed)
   Subjective:    Patient ID: [name], female    DOB: 12-26-1970, 43 y.o.   MRN: [id number]  HPI Patient is here for her 6 month follow up. We talked at length about how she is dieting she's watching her diet she's lost weight and we've also talked about her exercise medicine compliance we also reviewed all over her labs. This patient does have significant health history risk factors including cholesterol fatty liver prediabetes. Review of Systems  Constitutional: Negative for activity change, appetite change and fatigue.  HENT: Negative for congestion.   Respiratory: Negative for cough and choking.   Cardiovascular: Negative for chest pain.  Gastrointestinal: Negative for abdominal pain.  Neurological: Negative for headaches.  Psychiatric/Behavioral: Negative for behavioral problems.       Objective:   Physical Exam  Vitals reviewed. Constitutional: She appears well-nourished. No distress.  Cardiovascular: Normal rate, regular rhythm and normal heart sounds.   No murmur heard. Pulmonary/Chest: Effort normal and breath sounds normal. No respiratory distress.  Musculoskeletal: She exhibits no edema.  Lymphadenopathy:    She has no cervical adenopathy.  Neurological: She is alert. She exhibits normal muscle tone.  Psychiatric: Her behavior is normal.          Assessment & Plan:  I did warn the patient that her diet medicine and Celexa could interact. She states she's been taking both together since they've been prescribed and she has not had any problems I warned her regarding the signs of serotonin excess. She voiced understanding.  Patient has done remarkable job losing weight. She will continue on her diet exercise and taking your medicine  Cholesterol looks remarkably better we will recheck this again in 6 months time  Fatty liver enzymes are doing better I think she is making significant progress she will continue with this  History of prediabetes her [name]  currently looks great.  Refills on her medications were given  Greater than half the time spent with the patient was in questions [phone]

## 2014-03-21 ENCOUNTER — Ambulatory Visit (INDEPENDENT_AMBULATORY_CARE_PROVIDER_SITE_OTHER): Payer: BC Managed Care – PPO | Admitting: Diagnostic Neuroimaging

## 2014-03-21 ENCOUNTER — Encounter: Payer: Self-pay | Admitting: Diagnostic Neuroimaging

## 2014-03-21 VITALS — BP 108/73 | HR 81 | Ht 65.0 in | Wt 170.0 lb

## 2014-03-21 DIAGNOSIS — R200 Anesthesia of skin: Secondary | ICD-10-CM

## 2014-03-21 DIAGNOSIS — R209 Unspecified disturbances of skin sensation: Secondary | ICD-10-CM

## 2014-03-21 NOTE — Patient Instructions (Signed)
Start gentle stretching/yoga exercises every morning (5-10 minutes).

## 2014-03-21 NOTE — Progress Notes (Signed)
[facility]  PATIENT: [name] DOB: 04/19/1971  REFERRING CLINICIAN: Nudelman HISTORY FROM: patient  REASON FOR VISIT: new consult   HISTORICAL  CHIEF COMPLAINT:  Chief Complaint  Patient presents with  . Numbness, Tingling    NP, paper referral, Rm    HISTORY OF PRESENT ILLNESS:   43 year old female with hypercholesterolemia, anxiety, multiple cervical spine surgeries, here for evaluation of bilateral foot numbness and tingling.  For past 7-8 years patient has had intermittent numbness and tingling especially when she crosses her legs, kneels down or plays with her children. Over the past couple of years the symptoms have increased. Now half of the time per day, she notices some intermittent tingling in her feet. This can be unilateral or bilateral. This occurs in a laying, sitting and standing position. Patient cannot identify any specific triggering factors. Numbness and tingling lasts for a few minutes at a time and then remits. Symptoms are mainly the bottom of her feet. No numbness in her proximal legs, abdomen, fingers or hands. Patient had MRI of the lumbar spine which was unremarkable. Patient has had screening for diabetes and hypothyroidism which are unremarkable.   REVIEW OF SYSTEMS: Full 14 system review of systems performed and notable only for blurred vision constipation allergies.  ALLERGIES: Allergies  Allergen Reactions  . Levaquin [Levofloxacin In D5w]     N/v/d    HOME MEDICATIONS: Outpatient Prescriptions Prior to Visit  Medication Sig Dispense Refill  . acyclovir (ZOVIRAX) 400 MG tablet TAKE 1 TABLET EVERY DAY  90 tablet  3  . b complex vitamins tablet Take 1 tablet by mouth daily.      . Calcium Carbonate-Vit D-Min (CALCIUM 1200 PO) Take 2 tablets by mouth daily. 2 tabs daily      . citalopram (CELEXA) 40 MG tablet TAKE 1 TABLET EVERY DAY  90 tablet  3  . clonazePAM (KLONOPIN) 1 MG tablet TAKE 1/2 TO 1 TABLET BY MOUTH  TWICE A DAY AS NEEDED  120 tablet  3  . CVS VITAMIN D3 1000 UNITS capsule TAKE 2 CAPSULES EVERY DAY  180 capsule  1  . cyclobenzaprine (FLEXERIL) 10 MG tablet Take 1 tablet (10 mg total) by mouth 3 (three) times daily as needed for muscle spasms.  50 tablet  0  . DIVIGEL 1 MG/GM GEL PLACE 1 PACKET ONTO THE SKIN DAILY.  30 g  3  . estrogens, conjugated, (PREMARIN) 1.25 MG tablet TAKE 1 TABLET EVERY DAY  90 tablet  3  . fluticasone (FLONASE) 50 MCG/ACT nasal spray USE 2 SPRAYS IN EACH NOSTRIL EVERY DAY  48 g  5  . Fluticasone-Salmeterol (ADVAIR DISKUS) 250-50 MCG/DOSE AEPB Inhale 1 puff into the lungs 2 (two) times daily.  3 each  4  . Melatonin 5 MG TABS Take 1 tablet by mouth at bedtime as needed. One at bedtime for sleep      . mirabegron ER (MYRBETRIQ) 50 MG TB24 tablet Take 1 tablet (50 mg total) by mouth daily.  90 tablet  3  . montelukast (SINGULAIR) 10 MG tablet Take 1 tablet (10 mg total) by mouth at bedtime.  90 tablet  3  . Multiple Vitamin (MULTIVITAMIN) tablet Take 1 tablet by mouth daily.      . Naltrexone-Bupropion HCl ER 8-90 MG TB12 2 tablets in the morning 2 tablets in the evening. Patient should gradually titrate up to this dose over 7 days.  120 tablet  6  . pravastatin (PRAVACHOL) 20 MG  tablet TAKE 1 TABLET EVERY EVENING  90 tablet  3  . topiramate (TOPAMAX) 50 MG tablet Take 1 tablet (50 mg total) by mouth 2 (two) times daily.  180 tablet  3   No facility-administered medications prior to visit.    PAST MEDICAL HISTORY: Past Medical History  Diagnosis Date  . IBS (irritable bowel syndrome)   . Overactive bladder   . High cholesterol   . Migraine   . Anxiety   . Hypertension   . Reactive airway disease   . Fatty liver   . Asthma     exercise induced  . Sleep apnea     uses cpap occ  . Personal history of kidney stones   . Renal cyst   . GERD (gastroesophageal reflux disease)     occ  . Anemia     hx  . Hx of abdominoplasty     PAST SURGICAL HISTORY: Past  Surgical History  Procedure Laterality Date  . Cesarean section    . Total abdominal hysterectomy w/ bilateral salpingoophorectomy    . Cervical disc surgery  07,11  . Breast surgery  09    breast red   . Posterior cervical fusion/foraminotomy N/A 01/07/2013    Procedure: POSTERIOR CERVICAL FUSION/FORAMINOTOMY LEVEL 2;  Surgeon: Hosie Spangle, MD;  Location: [facility];  Service: Neurosurgery;  Laterality: N/A;  Posterior Cervical Five-Six/Six-Seven Arthrodesis with Instrumentation    FAMILY HISTORY: Family History  Problem Relation Age of Onset  . Cirrhosis Father   . Diabetes Father   . Hypertension Father   . Cancer Other   . Hypertension Mother     SOCIAL HISTORY:  History   Social History  . Marital Status: Married    Spouse Name: Laverna Peace    Number of Children: 2  . Years of Education: college   Occupational History  . self employed    Social History Main Topics  . Smoking status: Never Smoker   . Smokeless tobacco: Not on file     Comment: oc alcohol  . Alcohol Use: Yes  . Drug Use: No  . Sexual Activity: Yes   Other Topics Concern  . Not on file   Social History Narrative  . No narrative on file     PHYSICAL EXAM  Filed Vitals:   03/21/14 0921  BP: 108/73  Pulse: 81  Height: 5' 5"  (1.651 m)  Weight: 170 lb (77.111 kg)    Not recorded    Body mass index is 28.29 kg/(m^2).  GENERAL EXAM: Patient is in no distress; well developed, nourished and groomed; neck is supple  CARDIOVASCULAR: Regular rate and rhythm, no murmurs, no carotid bruits  NEUROLOGIC: MENTAL STATUS: awake, alert, oriented to person, place and time, recent and remote memory intact, normal attention and concentration, language fluent, comprehension intact, naming intact, fund of knowledge appropriate CRANIAL NERVE: no papilledema on fundoscopic exam, pupils equal and reactive to light, visual fields full to confrontation, extraocular muscles intact, no nystagmus, facial  sensation and strength symmetric, hearing intact, palate elevates symmetrically, uvula midline, shoulder shrug symmetric, tongue midline. MOTOR: normal bulk and tone, full strength in the BUE, BLE SENSORY: normal and symmetric to light touch, temperature, vibration; EXCEPT SLIGHTLY REDUCED PP IN LEFT FOOT GREAT TOE COORDINATION: finger-nose-finger, fine finger movements normal REFLEXES: deep tendon reflexes present and symmetric GAIT/STATION: narrow based gait; able to walk on toes, heels and tandem; romberg is negative    DIAGNOSTIC DATA (LABS, IMAGING, TESTING) - I reviewed patient  records, labs, notes, testing and imaging myself where available.  Lab Results  Component Value Date   WBC 7.0 12/31/2012   HGB 13.2 12/31/2012   HCT 38.5 12/31/2012   MCV 92.3 12/31/2012   PLT 344 12/31/2012      Component Value Date/Time   NA 138 03/13/2014 1459   K 4.7 03/13/2014 1459   CL 103 03/13/2014 1459   CO2 25 03/13/2014 1459   GLUCOSE 92 03/13/2014 1459   BUN 11 03/13/2014 1459   CREATININE 1.10 03/13/2014 1459   CREATININE 0.92 12/31/2012 1045   CALCIUM 9.4 03/13/2014 1459   PROT 7.7 03/13/2014 1459   ALBUMIN 4.7 03/13/2014 1459   AST 45* 03/13/2014 1459   ALT 45* 03/13/2014 1459   ALKPHOS 73 03/13/2014 1459   BILITOT 0.7 03/13/2014 1459   GFRNONAA 75* 12/31/2012 1045   GFRAA 87* 12/31/2012 1045   Lab Results  Component Value Date   CHOL 224* 03/13/2014   HDL 60 03/13/2014   LDLCALC 124* 03/13/2014   TRIG 199* 03/13/2014   CHOLHDL 3.7 03/13/2014   Lab Results  Component Value Date   HGBA1C 5.5 03/13/2014   No results found for this basename: [id number]   Lab Results  Component Value Date   TSH 0.908 03/13/2014    I reviewed images myself. Below is my interpretation. -VRP  02/21/14 MRI LUMBAR SPINE - mild spondylosis and disc bulging with multi-level facet hypertrophy; at L4-5 there is mild right and moderate left foraminal stenosis; schmorl's node with endplate edema at superior L5. Left renal cyst  (1.7cm).   ASSESSMENT AND PLAN  43 y.o. year old female here with intermittent mild bilateral foot numbness. Neuro exam unremarkable except for mild decr PP sensation in left distal foot.   Ddx: lumbar radiculopathy vs peripheral neuropathy (small fiber) vs intermittent compression neuropathy  PLAN: - monitor symptoms over next three months for associated / triggering factors - may consider EMG/NCS at next visit  Return in about 3 months (around 06/21/2014).    Penni Bombard, MD 9/73/5329, 92:42 AM Certified in Neurology, Neurophysiology and Neuroimaging  [facility] [address], [place], [place] [address] [phone]

## 2014-04-11 ENCOUNTER — Other Ambulatory Visit: Payer: Self-pay | Admitting: *Deleted

## 2014-04-11 ENCOUNTER — Other Ambulatory Visit: Payer: Self-pay | Admitting: Family Medicine

## 2014-04-11 ENCOUNTER — Telehealth: Payer: Self-pay | Admitting: Family Medicine

## 2014-04-11 MED ORDER — PREDNISONE 20 MG PO TABS: ORAL_TABLET | ORAL | Status: DC

## 2014-04-11 NOTE — Telephone Encounter (Signed)
Patient said she got into some poison ivy today and it is on her legs, arms, and face. She said she has to take a steroid to get rid of this, so she is requesting we send in to her pharmacy or she says she will end up in the ER. Please advise.   [facility]

## 2014-04-11 NOTE — Telephone Encounter (Signed)
Med sent to pharm. Pt notified.  

## 2014-04-11 NOTE — Telephone Encounter (Signed)
Adult pred taper

## 2014-05-19 ENCOUNTER — Ambulatory Visit (INDEPENDENT_AMBULATORY_CARE_PROVIDER_SITE_OTHER): Payer: BC Managed Care – PPO | Admitting: *Deleted

## 2014-05-19 DIAGNOSIS — Z23 Encounter for immunization: Secondary | ICD-10-CM

## 2014-06-03 ENCOUNTER — Ambulatory Visit: Payer: BC Managed Care – PPO | Admitting: Family Medicine

## 2014-06-14 ENCOUNTER — Other Ambulatory Visit: Payer: Self-pay | Admitting: Family Medicine

## 2014-06-24 ENCOUNTER — Encounter: Payer: Self-pay | Admitting: Diagnostic Neuroimaging

## 2014-06-24 ENCOUNTER — Ambulatory Visit (INDEPENDENT_AMBULATORY_CARE_PROVIDER_SITE_OTHER): Payer: BC Managed Care – PPO | Admitting: Diagnostic Neuroimaging

## 2014-06-24 VITALS — BP 127/80 | HR 83 | Temp 97.0°F | Ht 65.5 in | Wt 185.0 lb

## 2014-06-24 DIAGNOSIS — R208 Other disturbances of skin sensation: Secondary | ICD-10-CM

## 2014-06-24 DIAGNOSIS — R200 Anesthesia of skin: Secondary | ICD-10-CM

## 2014-06-24 NOTE — Progress Notes (Signed)
[facility]  PATIENT: [name] DOB: May 19, 1971  REFERRING CLINICIAN: Nudelman HISTORY FROM: patient  REASON FOR VISIT: follow up   HISTORICAL  CHIEF COMPLAINT:  Chief Complaint  Patient presents with  . Follow-up    numbness    HISTORY OF PRESENT ILLNESS:   UPDATE 06/24/14: Since last visit, doing about the same. Still with intermittent numbness and tingling in feet, sometimes with standing and sometimes with sitting. No new sxs. Planning to go to podiatry for evaluation.  PRIOR HPI (03/21/14): 43 year old female with hypercholesterolemia, anxiety, multiple cervical spine surgeries, here for evaluation of bilateral foot numbness and tingling. For past 7-8 years patient has had intermittent numbness and tingling especially when she crosses her legs, kneels down or plays with her children. Over the past couple of years the symptoms have increased. Now half of the time per day, she notices some intermittent tingling in her feet. This can be unilateral or bilateral. This occurs in a laying, sitting and standing position. Patient cannot identify any specific triggering factors. Numbness and tingling lasts for a few minutes at a time and then remits. Symptoms are mainly the bottom of her feet. No numbness in her proximal legs, abdomen, fingers or hands. Patient had MRI of the lumbar spine which was unremarkable. Patient has had screening for diabetes and hypothyroidism which are unremarkable.   REVIEW OF SYSTEMS: Full 14 system review of systems performed and notable only for fatigue leg swelling restless leg black stools constipation headaches dizziness easy bruising itching.   ALLERGIES: Allergies  Allergen Reactions  . Levaquin [Levofloxacin In D5w]     N/v/d    HOME MEDICATIONS: Outpatient Prescriptions Prior to Visit  Medication Sig Dispense Refill  . acyclovir (ZOVIRAX) 400 MG tablet TAKE 1 TABLET EVERY DAY 90 tablet 3  . b complex vitamins tablet  Take 1 tablet by mouth daily.    . Calcium Carbonate-Vit D-Min (CALCIUM 1200 PO) Take 2 tablets by mouth daily. 2 tabs daily    . citalopram (CELEXA) 40 MG tablet TAKE 1 TABLET EVERY DAY 90 tablet 3  . clonazePAM (KLONOPIN) 1 MG tablet TAKE 1/2 TO 1 TABLET BY MOUTH TWICE A DAY AS NEEDED 120 tablet 3  . [facility]S VITAMIN D3 1000 UNITS capsule TAKE 2 CAPSULES EVERY DAY 180 capsule 1  . DIVIGEL 1 MG/GM GEL PLACE 1 PACKET ONTO THE SKIN DAILY. 30 g 3  . estrogens, conjugated, (PREMARIN) 1.25 MG tablet TAKE 1 TABLET EVERY DAY 90 tablet 3  . fluticasone (FLONASE) 50 MCG/ACT nasal spray USE 2 SPRAYS IN EACH NOSTRIL EVERY DAY (Patient taking differently: daily as needed. USE 2 SPRAYS IN EACH NOSTRIL EVERY DAY) 48 g 5  . Fluticasone-Salmeterol (ADVAIR DISKUS) 250-50 MCG/DOSE AEPB Inhale 1 puff into the lungs 2 (two) times daily. 3 each 4  . Melatonin 5 MG TABS Take 1 tablet by mouth at bedtime as needed. One at bedtime for sleep    . MILK THISTLE PO Take by mouth daily.    . mirabegron ER (MYRBETRIQ) 50 MG TB24 tablet Take 1 tablet (50 mg total) by mouth daily. 90 tablet 3  . montelukast (SINGULAIR) 10 MG tablet Take 1 tablet (10 mg total) by mouth at bedtime. 90 tablet 3  . Multiple Vitamin (MULTIVITAMIN) tablet Take 1 tablet by mouth daily.    . Naltrexone-Bupropion HCl ER 8-90 MG TB12 2 tablets in the morning 2 tablets in the evening. Patient should gradually titrate up to this dose over 7 days.  120 tablet 6  . nitrofurantoin (MACRODANTIN) 100 MG capsule Take 100 mg by mouth daily.    . Omega-3 Fatty Acids (OMEGA-3 FISH OIL PO) Take by mouth daily.    . pravastatin (PRAVACHOL) 20 MG tablet TAKE 1 TABLET EVERY EVENING 90 tablet 0  . predniSONE (DELTASONE) 20 MG tablet Take 3 tabs for 3 days, then 2 tabs for 3 days, then 1 tab for 2 days 17 tablet 0  . topiramate (TOPAMAX) 50 MG tablet TAKE 1 TABLET TWICE A DAY 180 tablet 0  . cyclobenzaprine (FLEXERIL) 10 MG tablet Take 1 tablet (10 mg total) by mouth 3  (three) times daily as needed for muscle spasms. 50 tablet 0   No facility-administered medications prior to visit.    PAST MEDICAL HISTORY: Past Medical History  Diagnosis Date  . IBS (irritable bowel syndrome)   . Overactive bladder   . High cholesterol   . Migraine   . Anxiety   . Hypertension   . Reactive airway disease   . Fatty liver   . Asthma     exercise induced  . Sleep apnea     uses cpap occ  . Personal history of kidney stones   . Renal cyst   . GERD (gastroesophageal reflux disease)     occ  . Anemia     hx  . Hx of abdominoplasty     PAST SURGICAL HISTORY: Past Surgical History  Procedure Laterality Date  . Cesarean section    . Total abdominal hysterectomy w/ bilateral salpingoophorectomy    . Cervical disc surgery  07,11  . Breast surgery  09    breast red   . Posterior cervical fusion/foraminotomy N/A 01/07/2013    Procedure: POSTERIOR CERVICAL FUSION/FORAMINOTOMY LEVEL 2;  Surgeon: Hosie Spangle, MD;  Location: [facility];  Service: Neurosurgery;  Laterality: N/A;  Posterior Cervical Five-Six/Six-Seven Arthrodesis with Instrumentation    FAMILY HISTORY: Family History  Problem Relation Age of Onset  . Cirrhosis Father   . Diabetes Father   . Hypertension Father   . Cancer Other   . Hypertension Mother     SOCIAL HISTORY:  History   Social History  . Marital Status: Married    Spouse Name: Laverna Peace    Number of Children: 2  . Years of Education: college   Occupational History  . self employed    Social History Main Topics  . Smoking status: Never Smoker   . Smokeless tobacco: Not on file     Comment: oc alcohol  . Alcohol Use: Yes  . Drug Use: No  . Sexual Activity: Yes   Other Topics Concern  . Not on file   Social History Narrative     PHYSICAL EXAM  Filed Vitals:   06/24/14 1340  BP: 127/80  Pulse: 83  Temp: 97 F (36.1 C)  TempSrc: Oral  Height: 5' 5.5" (1.664 m)  Weight: 185 lb (83.915 kg)    Not  recorded      Body mass index is 30.31 kg/(m^2).  GENERAL EXAM: Patient is in no distress; well developed, nourished and groomed; neck is supple  CARDIOVASCULAR: Regular rate and rhythm, no murmurs, no carotid bruits  NEUROLOGIC: MENTAL STATUS: awake, alert, language fluent, comprehension intact, naming intact, fund of knowledge appropriate CRANIAL NERVE: no papilledema on fundoscopic exam, pupils equal and reactive to light, visual fields full to confrontation, extraocular muscles intact, no nystagmus, facial sensation and strength symmetric, hearing intact, palate elevates symmetrically, uvula midline,  shoulder shrug symmetric, tongue midline. MOTOR: normal bulk and tone, full strength in the BUE, BLE SENSORY: normal and symmetric to light touch COORDINATION: finger-nose-finger, fine finger movements normal REFLEXES: deep tendon reflexes present and symmetric GAIT/STATION: narrow based gait; able to walk on toes, heels and tandem; romberg is negative    DIAGNOSTIC DATA (LABS, IMAGING, TESTING) - I reviewed patient records, labs, notes, testing and imaging myself where available.  Lab Results  Component Value Date   WBC 7.0 12/31/2012   HGB 13.2 12/31/2012   HCT 38.5 12/31/2012   MCV 92.3 12/31/2012   PLT 344 12/31/2012      Component Value Date/Time   NA 138 03/13/2014 1459   K 4.7 03/13/2014 1459   CL 103 03/13/2014 1459   CO2 25 03/13/2014 1459   GLUCOSE 92 03/13/2014 1459   BUN 11 03/13/2014 1459   CREATININE 1.10 03/13/2014 1459   CREATININE 0.92 12/31/2012 1045   CALCIUM 9.4 03/13/2014 1459   PROT 7.7 03/13/2014 1459   ALBUMIN 4.7 03/13/2014 1459   AST 45* 03/13/2014 1459   ALT 45* 03/13/2014 1459   ALKPHOS 73 03/13/2014 1459   BILITOT 0.7 03/13/2014 1459   GFRNONAA 75* 12/31/2012 1045   GFRAA 87* 12/31/2012 1045   Lab Results  Component Value Date   CHOL 224* 03/13/2014   HDL 60 03/13/2014   LDLCALC 124* 03/13/2014   TRIG 199* 03/13/2014   CHOLHDL  3.7 03/13/2014   Lab Results  Component Value Date   HGBA1C 5.5 03/13/2014   No results found for: [id number] Lab Results  Component Value Date   TSH 0.908 03/13/2014    I reviewed images myself. Below is my interpretation. -VRP  02/21/14 MRI LUMBAR SPINE - mild spondylosis and disc bulging with multi-level facet hypertrophy; at L4-5 there is mild right and moderate left foraminal stenosis; schmorl's node with endplate edema at superior L5. Left renal cyst (1.7cm).   ASSESSMENT AND PLAN  43 y.o. year old female here with intermittent mild bilateral foot numbness. Neuro exam unremarkable.  Ddx: peripheral neuropathy (small fiber) vs intermittent compression neuropathy  PLAN: - monitor symptoms, follow up as needed - may consider EMG/NCS in future, but with mild intermittent symptoms and normal exam, will hold off for now  Return if symptoms worsen or fail to improve.    Penni Bombard, MD 96/08/9526, 4:13 PM Certified in Neurology, Neurophysiology and Neuroimaging  [facility] [address], [place], [place] [address] [phone]

## 2014-08-11 ENCOUNTER — Other Ambulatory Visit: Payer: Self-pay | Admitting: Family Medicine

## 2014-08-12 NOTE — Telephone Encounter (Signed)
Ok times one 

## 2014-08-13 ENCOUNTER — Other Ambulatory Visit: Payer: Self-pay | Admitting: Family Medicine

## 2014-09-08 ENCOUNTER — Ambulatory Visit: Payer: BC Managed Care – PPO | Admitting: Family Medicine

## 2014-09-08 ENCOUNTER — Other Ambulatory Visit: Payer: Self-pay | Admitting: Family Medicine

## 2014-09-08 ENCOUNTER — Telehealth: Payer: Self-pay | Admitting: Family Medicine

## 2014-09-08 NOTE — Telephone Encounter (Signed)
Please notify pt she is due for 6 month follow up offfice  Visit and labs (may send a card or call her) Thanks

## 2014-09-23 ENCOUNTER — Telehealth: Payer: Self-pay | Admitting: Family Medicine

## 2014-09-23 NOTE — Telephone Encounter (Signed)
Please send the patient a card letting her know that she needs a 6 month follow-up and lab work. Also documented in the chart that this was completed thank you

## 2014-10-02 ENCOUNTER — Other Ambulatory Visit: Payer: Self-pay | Admitting: Obstetrics & Gynecology

## 2014-11-18 ENCOUNTER — Telehealth: Payer: Self-pay | Admitting: Family Medicine

## 2014-11-18 DIAGNOSIS — E785 Hyperlipidemia, unspecified: Secondary | ICD-10-CM

## 2014-11-18 DIAGNOSIS — Z79899 Other long term (current) drug therapy: Secondary | ICD-10-CM

## 2014-11-18 DIAGNOSIS — R739 Hyperglycemia, unspecified: Secondary | ICD-10-CM

## 2014-11-18 NOTE — Telephone Encounter (Signed)
bw orders ready and put in mail. Pt notified. Pt to call back and schedule office visit.

## 2014-11-18 NOTE — Telephone Encounter (Signed)
03/13/14 lipid, liver, tsh, bmp, a1c

## 2014-11-18 NOTE — Telephone Encounter (Signed)
Lipid, liver, metabolic 7, [name] 

## 2014-11-18 NOTE — Telephone Encounter (Signed)
Patient needs lab paper and she uses [facility] due to her insurance. Please mail paper work and she would like to know when you mail it out so she can schedule appointment.

## 2015-01-16 LAB — BASIC METABOLIC PANEL
BUN: 21 mg/dL (ref 6–23)
CALCIUM: 9.3 mg/dL (ref 8.4–10.5)
CO2: 28 mEq/L (ref 19–32)
Chloride: 102 mEq/L (ref 96–112)
Creat: 0.82 mg/dL (ref 0.50–1.10)
GLUCOSE: 81 mg/dL (ref 70–99)
Potassium: 4.9 mEq/L (ref 3.5–5.3)
Sodium: 144 mEq/L (ref 135–145)

## 2015-01-16 LAB — TSH: TSH: 1.038 u[IU]/mL (ref 0.350–4.500)

## 2015-01-16 LAB — LIPID PANEL
Cholesterol: 218 mg/dL — ABNORMAL HIGH (ref 0–200)
HDL: 48 mg/dL (ref >=46)
LDL CALC: 129 mg/dL — AB (ref 0–99)
Total CHOL/HDL Ratio: 4.5 Ratio
Triglycerides: 203 mg/dL — ABNORMAL HIGH (ref <150)
VLDL: 41 mg/dL — AB (ref 0–40)

## 2015-01-16 LAB — HEMOGLOBIN A1C
HEMOGLOBIN A1C: 6 % — AB (ref <5.7)
MEAN PLASMA GLUCOSE: 126 mg/dL — AB (ref <117)

## 2015-02-01 ENCOUNTER — Other Ambulatory Visit: Payer: Self-pay | Admitting: Family Medicine

## 2015-02-23 ENCOUNTER — Encounter: Payer: Self-pay | Admitting: Family Medicine

## 2015-02-23 ENCOUNTER — Ambulatory Visit (INDEPENDENT_AMBULATORY_CARE_PROVIDER_SITE_OTHER): Payer: BLUE CROSS/BLUE SHIELD | Admitting: Family Medicine

## 2015-02-23 VITALS — BP 136/84 | Ht 65.0 in | Wt 203.5 lb

## 2015-02-23 DIAGNOSIS — F419 Anxiety disorder, unspecified: Secondary | ICD-10-CM

## 2015-02-23 DIAGNOSIS — E785 Hyperlipidemia, unspecified: Secondary | ICD-10-CM | POA: Diagnosis not present

## 2015-02-23 DIAGNOSIS — J4520 Mild intermittent asthma, uncomplicated: Secondary | ICD-10-CM

## 2015-02-23 DIAGNOSIS — R739 Hyperglycemia, unspecified: Secondary | ICD-10-CM

## 2015-02-23 MED ORDER — PRAVASTATIN SODIUM 20 MG PO TABS: 20.0000 mg | ORAL_TABLET | Freq: Every evening | ORAL | Status: DC

## 2015-02-23 MED ORDER — LORCASERIN HCL 10 MG PO TABS: ORAL_TABLET | ORAL | Status: DC

## 2015-02-23 MED ORDER — CITALOPRAM HYDROBROMIDE 40 MG PO TABS: ORAL_TABLET | ORAL | Status: DC

## 2015-02-23 MED ORDER — ESTROGENS CONJUGATED 1.25 MG PO TABS: ORAL_TABLET | ORAL | Status: DC

## 2015-02-23 MED ORDER — CLONAZEPAM 1 MG PO TABS: ORAL_TABLET | ORAL | Status: DC

## 2015-02-23 MED ORDER — MONTELUKAST SODIUM 10 MG PO TABS: 10.0000 mg | ORAL_TABLET | Freq: Every day | ORAL | Status: DC

## 2015-02-23 NOTE — Progress Notes (Signed)
   Subjective:    Patient ID: [name], female    DOB: Dec 15, 1970, 44 y.o.   MRN: [id number]  HPI  Patient in today to discuss lab results from 01/15/15. Patient would like to discuss weight. Patient does have cholesterol issues we went over her cholesterol total hired what we like to see if she is not been eating exactly perfect  Patient states she is doing some physical activity that she is gaining weight too much weight she does drink tea that does have sugar and which works against this  She is interested in weight reduction medication  She does state her lungs overall seem to be doing pretty good she's not having to use her medications  She states her moods overall are doing good in states of medication seems to be helping  She does state that she sees her gynecologist on a yearly basis for wellness and we did talk with her about the importance of discussing Estrogel use with them possibly decreasing the dose as she gets older  Review of Systems Patient denies any chest tightness pressure pain shortness breath nausea vomiting diarrhea     Objective:   Physical Exam Neck no masses lungs are clear no crackles heart regular pulse normal BP good extremities no edema skin warm dry       Assessment & Plan:  1. Hyperlipidemia Continue current medications watch diet closelytry to lose weight  2. Hyperglycemia Prediabetes noted importance of losing weight watching diet eliminating all sugary drinks minimizing carbohydrates  3. Reactive airway disease, mild intermittent, uncomplicated Stable uses Advair intermittently uses albuterol intermittently  4. Obesity, morbid The importance of losing weight discuss we will try Belviq 10 mg twice a day for the next several months if this does not dramatically help her with losing weight and her weight continues to increase she may need to consider gastric bypass  5. Anxiety Celexa seems to be doing good continue this

## 2015-03-22 ENCOUNTER — Other Ambulatory Visit: Payer: Self-pay | Admitting: Family Medicine

## 2015-04-23 ENCOUNTER — Encounter: Payer: Self-pay | Admitting: Obstetrics & Gynecology

## 2015-04-23 ENCOUNTER — Ambulatory Visit (INDEPENDENT_AMBULATORY_CARE_PROVIDER_SITE_OTHER): Payer: BLUE CROSS/BLUE SHIELD | Admitting: Obstetrics & Gynecology

## 2015-04-23 VITALS — BP 120/80 | HR 80 | Ht 65.0 in | Wt 195.4 lb

## 2015-04-23 DIAGNOSIS — Z01419 Encounter for gynecological examination (general) (routine) without abnormal findings: Secondary | ICD-10-CM | POA: Diagnosis not present

## 2015-04-23 MED ORDER — ESTRADIOL 1 MG/GM TD GEL: TRANSDERMAL | Status: DC

## 2015-04-23 NOTE — Progress Notes (Signed)
Patient ID: [name], female   DOB: 1971/02/14, 44 y.o.   MRN: [id number] Subjective:     [name] is a 44 y.o. female here for a routine exam.  No LMP recorded. Patient has had a hysterectomy. No obstetric history on file. Birth Control Method:  hysterectomy Menstrual Calendar(currently): hyst  Current complaints: none.   Current acute medical issues:  none   Recent Gynecologic History No LMP recorded. Patient has had a hysterectomy. Last Pap: several years,   Last mammogram: 2016,  normal  Past Medical History  Diagnosis Date  . IBS (irritable bowel syndrome)   . Overactive bladder   . High cholesterol   . Migraine   . Anxiety   . Hypertension   . Reactive airway disease   . Fatty liver   . Asthma     exercise induced  . Sleep apnea     uses cpap occ  . Personal history of kidney stones   . Renal cyst   . GERD (gastroesophageal reflux disease)     occ  . Anemia     hx  . Hx of abdominoplasty     Past Surgical History  Procedure Laterality Date  . Cesarean section    . Total abdominal hysterectomy w/ bilateral salpingoophorectomy    . Cervical disc surgery  07,11  . Breast surgery  09    breast red   . Posterior cervical fusion/foraminotomy N/A 01/07/2013    Procedure: POSTERIOR CERVICAL FUSION/FORAMINOTOMY LEVEL 2;  Surgeon: Hosie Spangle, MD;  Location: [facility];  Service: Neurosurgery;  Laterality: N/A;  Posterior Cervical Five-Six/Six-Seven Arthrodesis with Instrumentation    OB History    No data available      Social History   Social History  . Marital Status: Married    Spouse Name: Laverna Peace  . Number of Children: 2  . Years of Education: college   Occupational History  . self employed    Social History Main Topics  . Smoking status: Never Smoker   . Smokeless tobacco: None     Comment: oc alcohol  . Alcohol Use: Yes  . Drug Use: No  . Sexual Activity: Yes   Other Topics Concern  . None   Social History Narrative     Family History  Problem Relation Age of Onset  . Cirrhosis Father   . Diabetes Father   . Hypertension Father   . Cancer Other   . Hypertension Mother      Current outpatient prescriptions:  .  b complex vitamins tablet, Take 1 tablet by mouth daily., Disp: , Rfl:  .  Calcium Carbonate-Vit D-Min (CALCIUM 1200 PO), Take 2 tablets by mouth daily. 2 tabs daily, Disp: , Rfl:  .  citalopram (CELEXA) 40 MG tablet, TAKE 1 TABLET EVERY DAY, Disp: 90 tablet, Rfl: 3 .  clonazePAM (KLONOPIN) 1 MG tablet, TAKE 1/2 TO 1 TABLET BY MOUTH TWICE A DAY AS NEEDED, Disp: 60 tablet, Rfl: 4 .  [facility]S D3 1000 UNITS capsule, TAKE 2 CAPSULES EVERY DAY, Disp: 180 capsule, Rfl: 1 .  Estradiol (DIVIGEL) 1 MG/GM GEL, PLACE 1 PACKET ONTO THE SKIN DAILY., Disp: 30 g, Rfl: 11 .  estrogens, conjugated, (PREMARIN) 1.25 MG tablet, TAKE 1 TABLET EVERY DAY, Disp: 90 tablet, Rfl: 3 .  fluticasone (FLONASE) 50 MCG/ACT nasal spray, USE 2 SPRAYS IN EACH NOSTRIL EVERY DAY, Disp: 48 g, Rfl: 5 .  Fluticasone-Salmeterol (ADVAIR DISKUS) 250-50 MCG/DOSE AEPB, Inhale 1 puff into the  lungs 2 (two) times daily., Disp: 3 each, Rfl: 4 .  Lorcaserin HCl (BELVIQ) 10 MG TABS, 1 bid, Disp: 180 tablet, Rfl: 1 .  montelukast (SINGULAIR) 10 MG tablet, Take 1 tablet (10 mg total) by mouth at bedtime., Disp: 90 tablet, Rfl: 3 .  Multiple Vitamin (MULTIVITAMIN) tablet, Take 1 tablet by mouth daily., Disp: , Rfl:  .  MYRBETRIQ 50 MG TB24 tablet, TAKE 1 TABLET (50 MG TOTAL) BY MOUTH DAILY., Disp: 90 tablet, Rfl: 3 .  Omega-3 Fatty Acids (OMEGA-3 FISH OIL PO), Take by mouth daily., Disp: , Rfl:  .  pravastatin (PRAVACHOL) 20 MG tablet, Take 1 tablet (20 mg total) by mouth every evening., Disp: 90 tablet, Rfl: 1 .  acyclovir (ZOVIRAX) 400 MG tablet, TAKE 1 TABLET EVERY DAY (Patient not taking: Reported on 04/23/2015), Disp: 90 tablet, Rfl: 3 .  Melatonin 5 MG TABS, Take 1 tablet by mouth at bedtime as needed. One at bedtime for sleep, Disp: , Rfl:  .   MILK THISTLE PO, Take by mouth daily., Disp: , Rfl:  .  montelukast (SINGULAIR) 10 MG tablet, TAKE 1 TABLET (10 MG TOTAL) BY MOUTH AT BEDTIME. (Patient not taking: Reported on 04/23/2015), Disp: 90 tablet, Rfl: 3 .  nitrofurantoin (MACRODANTIN) 100 MG capsule, Take 100 mg by mouth daily., Disp: , Rfl:   Review of Systems  Review of Systems  Constitutional: Negative for fever, chills, weight loss, malaise/fatigue and diaphoresis.  HENT: Negative for hearing loss, ear pain, nosebleeds, congestion, sore throat, neck pain, tinnitus and ear discharge.   Eyes: Negative for blurred vision, double vision, photophobia, pain, discharge and redness.  Respiratory: Negative for cough, hemoptysis, sputum production, shortness of breath, wheezing and stridor.   Cardiovascular: Negative for chest pain, palpitations, orthopnea, claudication, leg swelling and PND.  Gastrointestinal: negative for abdominal pain. Negative for heartburn, nausea, vomiting, diarrhea, constipation, blood in stool and melena.  Genitourinary: Negative for dysuria, urgency, frequency, hematuria and flank pain.  Musculoskeletal: Negative for myalgias, back pain, joint pain and falls.  Skin: Negative for itching and rash.  Neurological: Negative for dizziness, tingling, tremors, sensory change, speech change, focal weakness, seizures, loss of consciousness, weakness and headaches.  Endo/Heme/Allergies: Negative for environmental allergies and polydipsia. Does not bruise/bleed easily.  Psychiatric/Behavioral: Negative for depression, suicidal ideas, hallucinations, memory loss and substance abuse. The patient is not nervous/anxious and does not have insomnia.        Objective:  Blood pressure 120/80, pulse 80, height 5' 5"  (1.651 m), weight 195 lb 6.4 oz (88.633 kg).   Physical Exam  Vitals reviewed. Constitutional: She is oriented to person, place, and time. She appears well-developed and well-nourished.  HENT:  Head: Normocephalic  and atraumatic.        Right Ear: External ear normal.  Left Ear: External ear normal.  Nose: Nose normal.  Mouth/Throat: Oropharynx is clear and moist.  Eyes: Conjunctivae and EOM are normal. Pupils are equal, round, and reactive to light. Right eye exhibits no discharge. Left eye exhibits no discharge. No scleral icterus.  Neck: Normal range of motion. Neck supple. No tracheal deviation present. No thyromegaly present.  Cardiovascular: Normal rate, regular rhythm, normal heart sounds and intact distal pulses.  Exam reveals no gallop and no friction rub.   No murmur heard. Respiratory: Effort normal and breath sounds normal. No respiratory distress. She has no wheezes. She has no rales. She exhibits no tenderness.  GI: Soft. Bowel sounds are normal. She exhibits no distension and no mass.  There is no tenderness. There is no rebound and no guarding.  Genitourinary:  Breasts no masses skin changes or nipple changes bilaterally      Vulva is normal without lesions Vagina is pink moist without discharge Cervix absentUterus absent Adnexa is negative   Musculoskeletal: Normal range of motion. She exhibits no edema and no tenderness.  Neurological: She is alert and oriented to person, place, and time. She has normal reflexes. She displays normal reflexes. No cranial nerve deficit. She exhibits normal muscle tone. Coordination normal.  Skin: Skin is warm and dry. No rash noted. No erythema. No pallor.  Psychiatric: She has a normal mood and affect. Her behavior is normal. Judgment and thought content normal.       Assessment:    Healthy female exam.    Plan:    Follow up in: 1 year.   Meds ordered this encounter  Medications  . Estradiol (DIVIGEL) 1 MG/GM GEL    Sig: PLACE 1 PACKET ONTO THE SKIN DAILY.    Dispense:  30 g    Refill:  11

## 2015-05-07 ENCOUNTER — Other Ambulatory Visit: Payer: Self-pay | Admitting: Family Medicine

## 2015-05-08 ENCOUNTER — Other Ambulatory Visit: Payer: Self-pay | Admitting: Family Medicine

## 2015-06-01 ENCOUNTER — Encounter: Payer: Self-pay | Admitting: Family Medicine

## 2015-06-01 ENCOUNTER — Other Ambulatory Visit: Payer: Self-pay | Admitting: Family Medicine

## 2015-06-01 ENCOUNTER — Ambulatory Visit (INDEPENDENT_AMBULATORY_CARE_PROVIDER_SITE_OTHER): Payer: BLUE CROSS/BLUE SHIELD | Admitting: Family Medicine

## 2015-06-01 VITALS — BP 132/88 | Ht 65.0 in | Wt 197.0 lb

## 2015-06-01 DIAGNOSIS — K5900 Constipation, unspecified: Secondary | ICD-10-CM | POA: Diagnosis not present

## 2015-06-01 DIAGNOSIS — R5383 Other fatigue: Secondary | ICD-10-CM | POA: Diagnosis not present

## 2015-06-01 DIAGNOSIS — M2550 Pain in unspecified joint: Secondary | ICD-10-CM | POA: Diagnosis not present

## 2015-06-01 DIAGNOSIS — R739 Hyperglycemia, unspecified: Secondary | ICD-10-CM

## 2015-06-01 DIAGNOSIS — Z23 Encounter for immunization: Secondary | ICD-10-CM | POA: Diagnosis not present

## 2015-06-01 DIAGNOSIS — G4730 Sleep apnea, unspecified: Secondary | ICD-10-CM | POA: Diagnosis not present

## 2015-06-01 DIAGNOSIS — R635 Abnormal weight gain: Secondary | ICD-10-CM | POA: Diagnosis not present

## 2015-06-01 LAB — HEMOGLOBIN A1C
Hgb A1c MFr Bld: 6.1 % — ABNORMAL HIGH (ref <5.7)
Mean Plasma Glucose: 128 mg/dL — ABNORMAL HIGH (ref <117)

## 2015-06-01 LAB — T4, FREE: Free T4: 0.76 ng/dL — ABNORMAL LOW (ref 0.80–1.80)

## 2015-06-01 LAB — RHEUMATOID FACTOR

## 2015-06-01 LAB — TSH: TSH: 1.566 u[IU]/mL (ref 0.350–4.500)

## 2015-06-01 MED ORDER — PHENTERMINE-TOPIRAMATE ER 7.5-46 MG PO CP24: ORAL_CAPSULE | ORAL | Status: DC

## 2015-06-01 NOTE — Progress Notes (Signed)
   Subjective:    Patient ID: [name], female    DOB: 03-06-1971, 44 y.o.   MRN: [id number]  HPIFollow up on weight. Took last belviq 47m one bid on Saturday. Pt states she is exercising and watching diet and still not losing weight.   Declines flu vaccine.  Has had complete hysterectomy- no ovaries  Sleepy a lot patient uses her CPAP on a regular basis but described herself as feeling sleepy and tired during the day she is able to stay alert for work and driving  Also stiffness and aching in joints for 30 minutes in the am Present months patient relates significant joint stiffness pain discomfort over the past several months worse in the morning for at least 30-45 minutes. Has never had this before.  Constipation mild- hard bm patient relates is been a change for her over the past few months she describes hard bowel movements has tried various over-the-counter measures without success. She states she even seen some blood in his stool when she wipes she has a very hard bowel movement. Patient also relates some intermittent left lower quadrant pain and feeling bloated she has had a hysterectomy. Family history of ovarian cancer. Patient does not have ovaries area  Review of Systems  Constitutional: Negative for activity change, appetite change and fatigue.  HENT: Negative for congestion.   Respiratory: Negative for cough.   Cardiovascular: Negative for chest pain.  Gastrointestinal: Positive for constipation and blood in stool. Negative for nausea, abdominal pain and diarrhea.  Endocrine: Negative for polydipsia and polyphagia.  Musculoskeletal: Positive for back pain and arthralgias.  Neurological: Negative for weakness.  Psychiatric/Behavioral: Negative for confusion.       Objective:   Physical Exam  Constitutional: She appears well-nourished. No distress.  Cardiovascular: Normal rate, regular rhythm and normal heart sounds.   No murmur heard. Pulmonary/Chest: Effort  normal and breath sounds normal. No respiratory distress.  Abdominal: Soft. She exhibits no mass. There is tenderness. There is no rebound and no guarding.  Musculoskeletal: She exhibits no edema.  Lymphadenopathy:    She has no cervical adenopathy.  Neurological: She is alert. She exhibits normal muscle tone.  Psychiatric: Her behavior is normal.  Vitals reviewed.  25 minutes was spent with the patient. Greater than half the time was spent in discussion and answering questions and counseling regarding the issues that the patient came in for today.        Assessment & Plan:   significant fatigue tiredness check TSH check T4    history of prediabetes check [name]   Significant muscle aches and muscle stiffness joint stiffness check lab work to rule out rheumatoid disease    patient with bowel habit changes with left lower quadrant abdominal pain and constipation over the past few months referral to gastroenterology will more than likely need colonoscopy as well patient will use MiraLAX when necessary   Patient with chronic bladder infections currently on Macrodantin she states she's been taking this for a couple years through her specialists we will look into the safety of this and advise patient accordingly   patient frustrated about her weight patient relates medication did not seem to help would like to try different medicine. Therefore we'll use Qsymia  At a starter dose if this does not adequately help we will bump up the dose she will let uKoreaknow

## 2015-06-02 ENCOUNTER — Encounter: Payer: Self-pay | Admitting: Gastroenterology

## 2015-06-02 LAB — ANA: Anti Nuclear Antibody(ANA): POSITIVE — AB

## 2015-06-02 LAB — ANTI-NUCLEAR AB-TITER (ANA TITER): ANA Titer 1: 1:80 {titer} — ABNORMAL HIGH

## 2015-06-02 LAB — SEDIMENTATION RATE: SED RATE: 17 mm/h (ref 0–20)

## 2015-06-03 ENCOUNTER — Telehealth: Payer: Self-pay | Admitting: Family Medicine

## 2015-06-03 MED ORDER — PHENTERMINE-TOPIRAMATE ER 7.5-46 MG PO CP24: ORAL_CAPSULE | ORAL | Status: DC

## 2015-06-03 NOTE — Telephone Encounter (Signed)
Please go ahead and issue prescription. This was ordered on the day she was here not sure what happened to the prescription thank you

## 2015-06-03 NOTE — Telephone Encounter (Signed)
Notified patient script will be faxed to pharmacy.  

## 2015-06-03 NOTE — Telephone Encounter (Signed)
Pt called stating that she was suppose to get a refill on her   Phentermine-Topiramate 7.5-46 MG CP24    When she was here on the 7th. Pt states that she did not receive a paper script and the pharmacy hasn't received one either. Please advise.

## 2015-06-05 ENCOUNTER — Other Ambulatory Visit: Payer: Self-pay

## 2015-06-05 DIAGNOSIS — R768 Other specified abnormal immunological findings in serum: Secondary | ICD-10-CM

## 2015-06-05 DIAGNOSIS — R7989 Other specified abnormal findings of blood chemistry: Secondary | ICD-10-CM

## 2015-06-05 MED ORDER — METFORMIN HCL 500 MG PO TABS: 250.0000 mg | ORAL_TABLET | Freq: Two times a day (BID) | ORAL | Status: DC

## 2015-06-13 ENCOUNTER — Encounter: Payer: Self-pay | Admitting: Family Medicine

## 2015-07-02 ENCOUNTER — Other Ambulatory Visit: Payer: Self-pay | Admitting: Family Medicine

## 2015-07-03 ENCOUNTER — Telehealth: Payer: Self-pay | Admitting: Family Medicine

## 2015-07-03 MED ORDER — PHENTERMINE-TOPIRAMATE ER 7.5-46 MG PO CP24: ORAL_CAPSULE | ORAL | Status: DC

## 2015-07-03 NOTE — Telephone Encounter (Signed)
Rx faxed to pharmacy. Patient notified.

## 2015-07-03 NOTE — Telephone Encounter (Signed)
Pt is requesting a refill on her Phentermine-Topiramate 7.5-46 MG    [facility] [place]

## 2015-07-03 NOTE — Telephone Encounter (Signed)
May give this and 2 refills, follow up approx 3 months

## 2015-07-05 ENCOUNTER — Telehealth: Payer: Self-pay | Admitting: Family Medicine

## 2015-07-05 NOTE — Telephone Encounter (Signed)
Please let the patient know that I did receive the lab work that she just recently completed. So moderate elevation of liver enzymes. The rest of her tests looked good. I believe what the patient is feeling with his bad liver. She has seen Dr. Sharlett Iles in the past this. Unfortunately there is not much one can do for fatty liver other than trying to keep weight under control. It would be advisable to follow-up with Dr. Sharlett Iles every few years because sometimes fatty liver can lead to cirrhosis. I would recommend the patient follow-up with Dr. Sharlett Iles in early 2017. We can progress with the referral if she agrees

## 2015-07-06 NOTE — Telephone Encounter (Signed)
Called patient and informed her per Dr.Scott Luking- Please let the patient know that I did receive the lab work that she just recently completed. So moderate elevation of liver enzymes. The rest of her tests looked good. I believe what the patient is feeling with his bad liver. She has seen Dr. Sharlett Iles in the past this. Unfortunately there is not much one can do for fatty liver other than trying to keep weight under control. It would be advisable to follow-up with Dr. Sharlett Iles every few years because sometimes fatty liver can lead to cirrhosis. I would recommend the patient follow-up with Dr. Sharlett Iles in early 2017. We can progress with the referral if she agrees. Patient verbalized understanding and stated that she would like to progress with the referral.

## 2015-07-07 ENCOUNTER — Encounter: Payer: Self-pay | Admitting: Family Medicine

## 2015-07-07 DIAGNOSIS — M797 Fibromyalgia: Secondary | ICD-10-CM | POA: Insufficient documentation

## 2015-07-10 DIAGNOSIS — M5030 Other cervical disc degeneration, unspecified cervical region: Secondary | ICD-10-CM | POA: Insufficient documentation

## 2015-07-10 DIAGNOSIS — G959 Disease of spinal cord, unspecified: Secondary | ICD-10-CM | POA: Insufficient documentation

## 2015-07-10 DIAGNOSIS — M4712 Other spondylosis with myelopathy, cervical region: Secondary | ICD-10-CM | POA: Insufficient documentation

## 2015-07-16 DIAGNOSIS — M5000 Cervical disc disorder with myelopathy, unspecified cervical region: Secondary | ICD-10-CM | POA: Insufficient documentation

## 2015-07-29 ENCOUNTER — Other Ambulatory Visit: Payer: Self-pay | Admitting: *Deleted

## 2015-07-29 MED ORDER — METFORMIN HCL 500 MG PO TABS: 250.0000 mg | ORAL_TABLET | Freq: Two times a day (BID) | ORAL | Status: DC

## 2015-07-30 ENCOUNTER — Ambulatory Visit: Payer: BLUE CROSS/BLUE SHIELD | Admitting: Family Medicine

## 2015-08-03 ENCOUNTER — Ambulatory Visit: Payer: Self-pay | Admitting: Gastroenterology

## 2015-08-06 ENCOUNTER — Telehealth: Payer: Self-pay | Admitting: Family Medicine

## 2015-08-06 MED ORDER — PHENTERMINE-TOPIRAMATE ER 11.25-69 MG PO CP24: ORAL_CAPSULE | ORAL | Status: DC

## 2015-08-06 NOTE — Telephone Encounter (Signed)
Called patient and informed her per Dr.Scott Luking-May refill medicine with dosage of 11.25/69, 1 every morning, #30, 2 refills, follow-up within 60 to no more than 90 days. Patient verbalized understanding.

## 2015-08-06 NOTE — Telephone Encounter (Signed)
Patient needs refill on Qsymia 7.5 mg/ 46 mg  She states you were going to up dosage if she needed it to be.

## 2015-08-06 NOTE — Telephone Encounter (Signed)
May refill medicine with dosage of 11.25/69, 1 every morning, #30, 2 refills, follow-up within 60 to no more than 90 days

## 2015-08-10 ENCOUNTER — Other Ambulatory Visit: Payer: Self-pay | Admitting: Family Medicine

## 2015-08-25 DIAGNOSIS — Z981 Arthrodesis status: Secondary | ICD-10-CM | POA: Insufficient documentation

## 2015-09-03 ENCOUNTER — Other Ambulatory Visit: Payer: Self-pay | Admitting: *Deleted

## 2015-09-03 MED ORDER — ESTRADIOL 1 MG/GM TD GEL: TRANSDERMAL | Status: DC

## 2015-09-07 ENCOUNTER — Other Ambulatory Visit: Payer: Self-pay | Admitting: Family Medicine

## 2015-09-07 NOTE — Telephone Encounter (Signed)
This and 2 refills

## 2015-09-10 ENCOUNTER — Encounter: Payer: Self-pay | Admitting: Internal Medicine

## 2015-09-24 ENCOUNTER — Other Ambulatory Visit: Payer: Self-pay | Admitting: Family Medicine

## 2015-09-24 ENCOUNTER — Other Ambulatory Visit: Payer: Self-pay | Admitting: Obstetrics & Gynecology

## 2015-09-24 NOTE — Telephone Encounter (Signed)
May have this in 3 refills

## 2015-09-25 ENCOUNTER — Telehealth: Payer: Self-pay | Admitting: Obstetrics & Gynecology

## 2015-09-25 ENCOUNTER — Telehealth: Payer: Self-pay | Admitting: Family Medicine

## 2015-09-25 ENCOUNTER — Telehealth: Payer: Self-pay | Admitting: *Deleted

## 2015-09-25 MED ORDER — ESTRADIOL 1 MG/GM TD GEL: TRANSDERMAL | Status: DC

## 2015-09-25 NOTE — Telephone Encounter (Signed)
Pt needs a Rx for Divigel 90 day supply due to insurance coverage per [facility].

## 2015-09-25 NOTE — Telephone Encounter (Signed)
Pt is requesting lab orders to be mailed to her. Pt is needing the lab orders for [facility]. Last labs per epic were:

## 2015-09-28 ENCOUNTER — Ambulatory Visit: Payer: BLUE CROSS/BLUE SHIELD | Admitting: Family Medicine

## 2015-10-01 ENCOUNTER — Other Ambulatory Visit: Payer: Self-pay | Admitting: Family Medicine

## 2015-10-05 ENCOUNTER — Encounter: Payer: Self-pay | Admitting: *Deleted

## 2015-10-07 ENCOUNTER — Other Ambulatory Visit: Payer: Self-pay | Admitting: Family Medicine

## 2015-10-07 NOTE — Telephone Encounter (Signed)
May have this +3 refills 

## 2015-10-12 ENCOUNTER — Ambulatory Visit (INDEPENDENT_AMBULATORY_CARE_PROVIDER_SITE_OTHER): Payer: BLUE CROSS/BLUE SHIELD | Admitting: Internal Medicine

## 2015-10-12 ENCOUNTER — Encounter: Payer: Self-pay | Admitting: Internal Medicine

## 2015-10-12 VITALS — BP 110/76 | HR 88 | Ht 65.0 in | Wt 195.8 lb

## 2015-10-12 DIAGNOSIS — Z800 Family history of malignant neoplasm of digestive organs: Secondary | ICD-10-CM

## 2015-10-12 DIAGNOSIS — K5900 Constipation, unspecified: Secondary | ICD-10-CM | POA: Diagnosis not present

## 2015-10-12 DIAGNOSIS — K589 Irritable bowel syndrome without diarrhea: Secondary | ICD-10-CM | POA: Diagnosis not present

## 2015-10-12 DIAGNOSIS — K7581 Nonalcoholic steatohepatitis (NASH): Secondary | ICD-10-CM

## 2015-10-12 DIAGNOSIS — K625 Hemorrhage of anus and rectum: Secondary | ICD-10-CM | POA: Diagnosis not present

## 2015-10-12 MED ORDER — LINACLOTIDE 145 MCG PO CAPS: 145.0000 ug | ORAL_CAPSULE | Freq: Every day | ORAL | Status: DC

## 2015-10-12 MED ORDER — NA SULFATE-K SULFATE-MG SULF 17.5-3.13-1.6 GM/177ML PO SOLN: ORAL | Status: DC

## 2015-10-12 MED ORDER — VITAMIN E 180 MG (400 UNIT) PO CAPS: 800.0000 [IU] | ORAL_CAPSULE | Freq: Every day | ORAL | Status: DC

## 2015-10-12 NOTE — Patient Instructions (Signed)
You have been scheduled for a colonoscopy. Please follow written instructions given to you at your visit today.  Please pick up your prep supplies at the pharmacy within the next 1-3 days. If you use inhalers (even only as needed), please bring them with you on the day of your procedure. Your physician has requested that you go to www.startemmi.com and enter the access code given to you at your visit today. This web site gives a general overview about your procedure. However, you should still follow specific instructions given to you by our office regarding your preparation for the procedure.  Please discontinue phenteramine (diet medicine) 10 days prior to procedure!  You have been scheduled for an abdominal ultrasound with elastography at [facility] (1st floor of hospital) on Thursday, 11/05/15 at 8:00 am. Please arrive 15 minutes prior to your appointment for registration. Make certain not to have anything to eat or drink 8 hours prior to your appointment. Should you need to reschedule your appointment, please contact radiology at [phone]. This test typically takes about 30 minutes to perform.  We have sent the following medications to your pharmacy for you to pick up at your convenience: Vitamin E 800 iu daily Linzess 145 mcg daily.

## 2015-10-12 NOTE — Progress Notes (Signed)
Patient ID: MITTIE Li, female   DOB: 05/23/1971, 45 y.o.   MRN: [id number] HPI: [name] is a 45 year old female with a past medical history of NASH (bx 2011), hypertension, hyperlipidemia, and IBS who is seen in consultation at the request of [name] to evaluate change in bowel habits with constipation and rectal bleeding. She is here alone today. She reports that for years she has had alternating bowel habits. Her habits would be hard stools alternating with loose stools. However over the last 3-4 months she has become significant only constipated. Stools are hard and small. They're occurring every 2-3 days. When most severe she can have "attacks" of lower abdominal cramping pain. She is tried over-the-counter senna laxative which helped slightly. During her "attacks" of abdominal pain she used one of her mother's prescription laxatives. It is unclear what medication this was. She has seen some blood on the stool and with wiping. Occasionally stools are painful to pass. Her lower abdominal pain seems to be worse with stress. She has rare heartburn but no dysphagia or odynophagia. She denies nausea or vomiting. Reports that she has lost 15 pounds intentionally but gained some of this back. She has a goal weight loss of 45 pounds.  She has been diagnosed with nonalcoholic fatty liver disease. She had an ultrasound showing fatty liver. She recalls being told her liver enzymes have been elevated. She has a family history of a colic cirrhosis in her father. She drinks all the hall rarely and socially. She estimates possibly a small glass of wine per week and never more. She has never used tobacco. She denies ascites, lower extremity edema, jaundice and bleeding.  Family history notable for a mother with ovarian cancer. For this reason this patient is status post total hysterectomy and bilateral oophorectomy. Her maternal grandmother had colon cancer at age 38. Her mother also suffers from IBS.    Past Medical History  Diagnosis Date  . IBS (irritable bowel syndrome)   . Overactive bladder   . High cholesterol   . Migraine   . Anxiety   . Hypertension   . Reactive airway disease   . Fatty liver   . Asthma     exercise induced  . Sleep apnea     uses cpap occ  . Personal history of kidney stones   . Renal cyst   . GERD (gastroesophageal reflux disease)     occ  . Anemia     hx  . Hx of abdominoplasty     Past Surgical History  Procedure Laterality Date  . Cesarean section      x 2  . Total abdominal hysterectomy w/ bilateral salpingoophorectomy    . Cervical disc surgery  2007, 2009, 2014, 2017  . Breast reduction surgery  2009  . Posterior cervical fusion/foraminotomy N/A 01/07/2013    Procedure: POSTERIOR CERVICAL FUSION/FORAMINOTOMY LEVEL 2;  Surgeon: Hosie Spangle, MD;  Location: [facility];  Service: Neurosurgery;  Laterality: N/A;  Posterior Cervical Five-Six/Six-Seven Arthrodesis with Instrumentation  . Tummy tuck      Outpatient Prescriptions Prior to Visit  Medication Sig Dispense Refill  . acyclovir (ZOVIRAX) 400 MG tablet TAKE 1 TABLET EVERY DAY 90 tablet 2  . b complex vitamins tablet Take 1 tablet by mouth daily.    . Calcium Carbonate-Vit D-Min (CALCIUM 1200 PO) Take 2 tablets by mouth daily. 2 tabs daily    . citalopram (CELEXA) 40 MG tablet TAKE 1 TABLET EVERY DAY 90 tablet  3  . clonazePAM (KLONOPIN) 1 MG tablet TAKE 1/2 TO 1 TABLET BY MOUTH TWICE A DAY AS NEEDED 60 tablet 3  . [facility] VITAMIN D3 1000 units capsule TAKE 2 CAPSULES EVERY DAY 180 capsule 1  . Estradiol (DIVIGEL) 1 MG/GM GEL PLACE 1 PACKET ONTO THE SKIN DAILY. 90 g 3  . estrogens, conjugated, (PREMARIN) 1.25 MG tablet TAKE 1 TABLET EVERY DAY 90 tablet 3  . fluticasone (FLONASE) 50 MCG/ACT nasal spray USE 2 SPRAYS IN EACH NOSTRIL EVERY DAY 48 g 5  . Fluticasone-Salmeterol (ADVAIR DISKUS) 250-50 MCG/DOSE AEPB Inhale 1 puff into the lungs 2 (two) times daily. 3 each 4  . metFORMIN  (GLUCOPHAGE) 500 MG tablet TAKE 0.5 TABLETS (250 MG TOTAL) BY MOUTH 2 (TWO) TIMES DAILY WITH A MEAL. 90 tablet 0  . montelukast (SINGULAIR) 10 MG tablet Take 1 tablet (10 mg total) by mouth at bedtime. 90 tablet 3  . MYRBETRIQ 50 MG TB24 tablet TAKE 1 TABLET BY MOUTH EVERY DAY 90 tablet 3  . nitrofurantoin (MACRODANTIN) 100 MG capsule Take 100 mg by mouth daily.    . Omega-3 Fatty Acids (OMEGA-3 FISH OIL PO) Take by mouth daily.    Marland Kitchen OVER THE COUNTER MEDICATION Colagen pill 3 pills bid    . Phentermine-Topiramate 11.25-69 MG CP24 Take 1 tablet by mouth once a day. 30 capsule 2  . pravastatin (PRAVACHOL) 20 MG tablet TAKE 1 TABLET (20 MG TOTAL) BY MOUTH EVERY EVENING. 90 tablet 1  . Lorcaserin HCl (BELVIQ) 10 MG TABS 1 bid 180 tablet 1  . Melatonin 5 MG TABS Take 1 tablet by mouth at bedtime as needed. One at bedtime for sleep     No facility-administered medications prior to visit.    Allergies  Allergen Reactions  . Levaquin [Levofloxacin In D5w]     N/v/d    Family History  Problem Relation Age of Onset  . Cirrhosis Father     alcohol abuse  . Diabetes Father   . Hypertension Father   . Ovarian cancer Mother   . Hypertension Mother   . Colon cancer Maternal Grandmother   . CAD Maternal Grandfather   . Irritable bowel syndrome Mother     Social History  Substance Use Topics  . Smoking status: Never Smoker   . Smokeless tobacco: Never Used  . Alcohol Use: 0.0 oz/week    0 Standard drinks or equivalent per week     Comment: occasional    ROS: As per history of present illness, otherwise negative  BP 110/76 mmHg  Pulse 88  Ht 5' 5"  (1.651 m)  Wt 195 lb 12.8 oz (88.814 kg)  BMI 32.58 kg/m2 Constitutional: Well-developed and well-nourished. No distress. HEENT: Normocephalic and atraumatic. Oropharynx is clear and moist. No oropharyngeal exudate. Conjunctivae are normal.  No scleral icterus. Neck: Neck supple. Trachea midline. Cardiovascular: Normal rate, regular  rhythm and intact distal pulses. No M/R/G Pulmonary/chest: Effort normal and breath sounds normal. No wheezing, rales or rhonchi. Abdominal: Soft, nontender, nondistended. Bowel sounds active throughout. There are no masses palpable. Extremities: no clubbing, cyanosis, or edema Lymphadenopathy: No cervical adenopathy noted. Neurological: Alert and oriented to person place and time. Skin: Skin is warm and dry. No rashes noted. Psychiatric: Normal mood and affect. Behavior is normal.  RELEVANT LABS AND IMAGING: CBC    Component Value Date/Time   WBC 7.0 12/31/2012 1045   RBC 4.17 12/31/2012 1045   HGB 13.2 12/31/2012 1045   HCT 38.5 12/31/2012 1045  PLT 344 12/31/2012 1045   MCV 92.3 12/31/2012 1045   MCH 31.7 12/31/2012 1045   MCHC 34.3 12/31/2012 1045   RDW 13.0 12/31/2012 1045   LYMPHSABS 3.9 11/07/2012 1125   MONOABS 0.6 11/07/2012 1125   EOSABS 0.3 11/07/2012 1125   BASOSABS 0.0 11/07/2012 1125    CMP     Component Value Date/Time   NA 144 01/15/2015 2351   K 4.9 01/15/2015 2351   CL 102 01/15/2015 2351   CO2 28 01/15/2015 2351   GLUCOSE 81 01/15/2015 2351   BUN 21 01/15/2015 2351   CREATININE 0.82 01/15/2015 2351   CREATININE 0.92 12/31/2012 1045   CALCIUM 9.3 01/15/2015 2351   PROT 7.7 03/13/2014 1459   ALBUMIN 4.7 03/13/2014 1459   AST 45* 03/13/2014 1459   ALT 45* 03/13/2014 1459   ALKPHOS 73 03/13/2014 1459   BILITOT 0.7 03/13/2014 1459   GFRNONAA 75* 12/31/2012 1045   GFRAA 87* 12/31/2012 1045   Liver bx 2011 FINAL DIAGNOSIS  1. Liver, needle/core biopsy, : - STEATOHEPATITIS/FATTY LIVER DISEASE SEE COMMENT - TRICHROME STAIN DEMONSTRATES PORTAL/PERIPORTAL AND PERISINUSOIDAL FIBROSIS - PAS STAIN IS NEGATIVE FOR ALPHA-1 ANTITRYPSIN DEPOSITION. - IRON STAIN IS NEGATIVE FOR HEMOSIDERIN DEPOSITION.  Viral hepatitis profile December 2016 -- negative. Hep B surface antibody is nonreactive. Core total was negative. ANA  negative  ASSESSMENT/PLAN: 45 year old female with a past medical history of NASH, hypertension, hyperlipidemia, and IBS who is seen in consultation at the request of [name] to evaluate change in bowel habits with constipation and rectal bleeding.   1. Change in bowel habit/constipation/rectal bleeding/family history of colon cancer and family history of ovarian cancer -- for all these reasons I recommended colonoscopy. We discussed the risks, benefits and alternatives and she is agreeable to proceed. Am going to prescribe Linzess 145 g daily to use for constipation and lower abdominal pain. She was counseled to notify me should she develop abdominal pain, diarrhea or nausea. Dose may need to be titrated up if not efficacious.  2. NASH -- biopsy-proven steatohepatitis from 2011. Recently liver enzymes have been mild to moderately elevated with AST 117 and ALT 123 in December 2016. I recommended that she continue to work on diet and exercise with a goal to lose 10% of her body weight. This should help fatty liver disease. Have also recommended vitamin E 800 international units daily. Given that it has been nearly 6 years since biopsy, I recommended ultrasound with elastography to assess for progressing fibrosis. Twinrix also recommended, 3 shot series. I recommended that she avoid alcohol.      ST:[id number] A [employer], [place], [place] [address]

## 2015-10-27 ENCOUNTER — Encounter: Payer: Self-pay | Admitting: Internal Medicine

## 2015-10-27 ENCOUNTER — Ambulatory Visit (AMBULATORY_SURGERY_CENTER): Payer: BLUE CROSS/BLUE SHIELD | Admitting: Internal Medicine

## 2015-10-27 VITALS — BP 133/77 | HR 71 | Temp 98.4°F | Resp 11 | Ht 65.0 in | Wt 195.0 lb

## 2015-10-27 DIAGNOSIS — K625 Hemorrhage of anus and rectum: Secondary | ICD-10-CM | POA: Diagnosis not present

## 2015-10-27 DIAGNOSIS — K589 Irritable bowel syndrome without diarrhea: Secondary | ICD-10-CM

## 2015-10-27 DIAGNOSIS — Z800 Family history of malignant neoplasm of digestive organs: Secondary | ICD-10-CM | POA: Diagnosis not present

## 2015-10-27 DIAGNOSIS — R194 Change in bowel habit: Secondary | ICD-10-CM

## 2015-10-27 DIAGNOSIS — K5900 Constipation, unspecified: Secondary | ICD-10-CM

## 2015-10-27 LAB — GLUCOSE, CAPILLARY
GLUCOSE-CAPILLARY: 76 mg/dL (ref 65–99)
Glucose-Capillary: 97 mg/dL (ref 65–99)

## 2015-10-27 MED ORDER — SODIUM CHLORIDE 0.9 % IV SOLN: 500.0000 mL | INTRAVENOUS | Status: DC

## 2015-10-27 NOTE — Patient Instructions (Signed)
YOU HAD AN ENDOSCOPIC PROCEDURE TODAY AT [facility]:   Refer to the procedure report that was given to you for any specific questions about what was found during the examination.  If the procedure report does not answer your questions, please call your gastroenterologist to clarify.  If you requested that your care partner not be given the details of your procedure findings, then the procedure report has been included in a sealed envelope for you to review at your convenience later.  YOU SHOULD EXPECT: Some feelings of bloating in the abdomen. Passage of more gas than usual.  Walking can help get rid of the air that was put into your GI tract during the procedure and reduce the bloating. If you had a lower endoscopy (such as a colonoscopy or flexible sigmoidoscopy) you may notice spotting of blood in your stool or on the toilet paper. If you underwent a bowel prep for your procedure, you may not have a normal bowel movement for a few days.  Please Note:  You might notice some irritation and congestion in your nose or some drainage.  This is from the oxygen used during your procedure.  There is no need for concern and it should clear up in a day or so.  SYMPTOMS TO REPORT IMMEDIATELY:   Following lower endoscopy (colonoscopy or flexible sigmoidoscopy):  Excessive amounts of blood in the stool  Significant tenderness or worsening of abdominal pains  Swelling of the abdomen that is new, acute  Fever of 100F or higher   For urgent or emergent issues, a gastroenterologist can be reached at any hour by calling [phone].   DIET: Your first meal following the procedure should be a small meal and then it is ok to progress to your normal diet. Heavy or fried foods are harder to digest and may make you feel nauseous or bloated.  Likewise, meals heavy in dairy and vegetables can increase bloating.  Drink plenty of fluids but you should avoid alcoholic beverages for 24  hours.  ACTIVITY:  You should plan to take it easy for the rest of today and you should NOT DRIVE or use heavy machinery until tomorrow (because of the sedation medicines used during the test).    FOLLOW UP: Our staff will call the number listed on your records the next business day following your procedure to check on you and address any questions or concerns that you may have regarding the information given to you following your procedure. If we do not reach you, we will leave a message.  However, if you are feeling well and you are not experiencing any problems, there is no need to return our call.  We will assume that you have returned to your regular daily activities without incident.  If any biopsies were taken you will be contacted by phone or by letter within the next 1-3 weeks.  Please call us at [phone] if you have not heard about the biopsies in 3 weeks.    SIGNATURES/CONFIDENTIALITY: You and/or your care partner have signed paperwork which will be entered into your electronic medical record.  These signatures attest to the fact that that the information above on your After Visit Summary has been reviewed and is understood.  Full responsibility of the confidentiality of this discharge information lies with you and/or your care-partner.  Hemorrhoid information given. Return to see Dr. Hilarie Fredrickson next available app. Increase Linzess to 214mg.

## 2015-10-27 NOTE — Progress Notes (Signed)
A and o x3 Report to RN

## 2015-10-27 NOTE — Op Note (Signed)
[facility] Patient Name: [name] Procedure Date: 10/27/2015 3:13 PM MRN: [id number] Endoscopist: Jerene Bears , MD Age: 45 Referring MD:  Date of Birth: 1971/05/10 Gender: Female Procedure:                Colonoscopy Indications:              Family history of colon cancer, Suspected irritable                            bowel syndrome, Change in bowel habits, Constipation Medicines:                Monitored Anesthesia Care Procedure:                Pre-Anesthesia Assessment:                           - Prior to the procedure, a History and Physical                            was performed, and patient medications and                            allergies were reviewed. The patient's tolerance of                            previous anesthesia was also reviewed. The risks                            and benefits of the procedure and the sedation                            options and risks were discussed with the patient.                            All questions were answered, and informed consent                            was obtained. Prior Anticoagulants: The patient has                            taken no previous anticoagulant or antiplatelet                            agents. ASA Grade Assessment: II - A patient with                            mild systemic disease. After reviewing the risks                            and benefits, the patient was deemed in                            satisfactory condition to undergo the procedure.  After obtaining informed consent, the colonoscope                            was passed under direct vision. Throughout the                            procedure, the patient's blood pressure, pulse, and                            oxygen saturations were monitored continuously. The                            Model PCF-H190DL [id number]) scope was introduced                            through the anus and  advanced to the the terminal                            ileum. The colonoscopy was performed without                            difficulty. The patient tolerated the procedure                            well. The quality of the bowel preparation was                            excellent. The terminal ileum, ileocecal valve,                            appendiceal orifice, and rectum were photographed. Scope In: 3:30:07 PM Scope Out: 3:44:33 PM Scope Withdrawal Time: 0 hours 8 minutes 51 seconds  Total Procedure Duration: 0 hours 14 minutes 26 seconds  Findings:      The digital rectal exam was normal.      The terminal ileum appeared normal.      The entire examined colon appeared normal.      Internal hemorrhoids were found during retroflexion. The hemorrhoids       were small. Complications:            No immediate complications. Estimated Blood Loss:     Estimated blood loss: none. Impression:               - The examined portion of the ileum was normal.                           - The entire examined colon is normal.                           - Internal hemorrhoids.                           - No specimens collected. Recommendation:           - Patient has a contact number available for  emergencies. The signs and symptoms of potential                            delayed complications were discussed with the                            patient. Return to normal activities tomorrow.                            Written discharge instructions were provided to the                            patient.                           - Resume previous diet.                           - Continue present medications.                           - Increase Linzess (linaclotide) to 290 mcg PO                            daily.                           - Repeat colonoscopy in 5 years for screening                            purposes.                           - Return to GI  clinic at the next available                            appointment. Jerene Bears, MD 10/27/2015 3:50:59 PM This report has been signed electronically. Number of Addenda: 0 Referring MD:      Nicki Reaper A. Luking

## 2015-10-28 ENCOUNTER — Telehealth: Payer: Self-pay

## 2015-10-28 NOTE — Telephone Encounter (Signed)
  Follow up Call-  Call back number 10/27/2015  Post procedure Call Back phone  # [phone]  Permission to leave phone message Yes     Patient questions:  Do you have a fever, pain , or abdominal swelling? Yes.   Pain Score  6 *  Have you tolerated food without any problems? Yes.    Have you been able to return to your normal activities? Yes.    Do you have any questions about your discharge instructions: Diet   No. Medications  No. Follow up visit  No.  Do you have questions or concerns about your Care? No.  Actions: * If pain score is 4 or above: No action needed, pain <4.   Pt said she was having abdominal pain rating it at 6 now.  She said she ate noodle and cracker last night.  I asked if she was trying to push the glass out and she said "not really".  I recommended her to ambulate around the house, drink warm fluids to help relax her abdominal smooth muscles and try getting on her hands and knees and rocking back and forth.  I also advised her not to eat anything fried, fatty or spicey until the gas is gone.  I asked the pt to call us back today if this suggestions do not resole the abd pain.  Pt said she would. maw

## 2015-10-29 ENCOUNTER — Other Ambulatory Visit: Payer: Self-pay | Admitting: Family Medicine

## 2015-10-29 NOTE — Telephone Encounter (Signed)
Pt is requesting to speak with Dr Nicki Reaper about the Phentermine-Topiramate 11.25-69 MG CP24. Pt states that she is having to come off of it due to it causing severe mood changes. Pt is aware that Dr Nicki Reaper is not in today.

## 2015-10-29 NOTE — Telephone Encounter (Signed)
Spoke with patient and patient stated that Qysmia was causing severe mood swings. She states she was very angry, and upset all the time. Stated she had taking another medication in the past with Topirmate that caused the same side effects. Patient has self stopped medication for a week now. Would like to know if she can start on Contrave?

## 2015-10-30 NOTE — Telephone Encounter (Signed)
Please put under allergies topirmate-causes anger , she may try contrave I am not certain if her insurance company will reimburse it the instructions are 2 tablets twice a day. She should start off with 1 tablet once daily for the first week then 1 tablet twice daily for the following week then 2 in the morning and 1 in the evening for 1 week then she may start taking 2 pills in the morning and 2 pills in the evening. Nurse's-I would recommend sending it in as 2 pills in the morning 2 pills in the evening as directed. #120, 5 refills. In addition to this just make sure he document that you instructed the patient how to start the medicine. If you have any particular questions regarding these instructions please clarify with me thank you

## 2015-11-02 NOTE — Telephone Encounter (Signed)
Consult with Dr Nicki Reaper- Patient can not take Contrave due to interaction with celexa.

## 2015-11-02 NOTE — Telephone Encounter (Signed)
Patient notified that Dr Nicki Reaper states she can not take Contrave due to interaction with celexa. Patient verbalized understanding and states she will check her schedule and call back to schedule an office visit to discuss other options.

## 2015-11-02 NOTE — Telephone Encounter (Signed)
Interactions state it can not be taken with celexa due bupropion component -please advise

## 2015-11-05 ENCOUNTER — Ambulatory Visit (HOSPITAL_COMMUNITY): Admission: RE | Admit: 2015-11-05 | Payer: BLUE CROSS/BLUE SHIELD | Source: Ambulatory Visit

## 2015-12-11 ENCOUNTER — Other Ambulatory Visit: Payer: Self-pay | Admitting: Family Medicine

## 2015-12-11 NOTE — Telephone Encounter (Signed)
May have this and one refill needs office visit

## 2015-12-23 ENCOUNTER — Other Ambulatory Visit: Payer: Self-pay | Admitting: Family Medicine

## 2015-12-24 NOTE — Telephone Encounter (Signed)
Patient may have 3 months worth a refill needs office visit

## 2015-12-30 ENCOUNTER — Encounter: Payer: Self-pay | Admitting: Internal Medicine

## 2015-12-30 ENCOUNTER — Telehealth: Payer: Self-pay | Admitting: *Deleted

## 2015-12-30 ENCOUNTER — Ambulatory Visit (INDEPENDENT_AMBULATORY_CARE_PROVIDER_SITE_OTHER): Payer: BLUE CROSS/BLUE SHIELD | Admitting: Internal Medicine

## 2015-12-30 VITALS — BP 140/100 | HR 88 | Ht 65.0 in | Wt 200.4 lb

## 2015-12-30 DIAGNOSIS — K7581 Nonalcoholic steatohepatitis (NASH): Secondary | ICD-10-CM

## 2015-12-30 DIAGNOSIS — K760 Fatty (change of) liver, not elsewhere classified: Secondary | ICD-10-CM

## 2015-12-30 DIAGNOSIS — K5900 Constipation, unspecified: Secondary | ICD-10-CM

## 2015-12-30 DIAGNOSIS — K5909 Other constipation: Secondary | ICD-10-CM

## 2015-12-30 MED ORDER — PLECANATIDE 3 MG PO TABS
1.0000 | ORAL_TABLET | Freq: Every day | ORAL | Status: DC
Start: 2015-12-30 — End: 2016-03-31

## 2015-12-30 NOTE — Telephone Encounter (Signed)
Patient no showed her elastography back in April. She states that she forgot about the test and was not reminded. I have rescheduled patient for [name] on 02/10/16 @ 11:00 am. NPO 6 hours prior. I have advised patient of this. She verbalizes understanding.

## 2015-12-30 NOTE — Progress Notes (Signed)
   Subjective:    Patient ID: [name], female    DOB: [birth year], 45 y.o.   MRN: [id number]  HPI [name] is a 45 years old with constipation, NASH, IBS who is here for follow-up. She was initially seen in March 2017 to evaluate constipation with change in bowel habit and rectal bleeding. She also has a family history of colon cancer. She came for colonoscopy performed on 10/27/2015. This revealed a normal examination from the terminal ileum to rectum other than internal hemorrhoids found on retroflexion. She was started on Linzess initially 145 g daily increased to 290 g after lack of response. She reports she continues to have issues with constipation, hard stools which she describes as "hard balls". She continues to have lower abdominal fullness but has had less "attacks" of cramping. She denies blood in her stool or melena, though occasionally red blood with wiping his stools are hard and difficult to pass.  She has been seen at [facility] and is currently on a gluten-free and dairy free diet. No dramatic change in symptoms to this point.  Review of Systems As per history of present illness, otherwise negative  Current Medications, Allergies, Past Medical History, Past Surgical History, Family History and Social History were reviewed in [employer] record.     Objective:   Physical Exam BP 140/100 mmHg  Pulse 88  Ht 5' 5"  (1.651 m)  Wt 200 lb 6.4 oz (90.901 kg)  BMI 33.35 kg/m2 Constitutional: Well-developed and well-nourished. No distress. HEENT: Normocephalic and atraumatic.  Conjunctivae are normal.  No scleral icterus. Neck: Neck supple. Trachea midline. Cardiovascular: Normal rate, regular rhythm and intact distal pulses. No M/R/G Pulmonary/chest: Effort normal and breath sounds normal. No wheezing, rales or rhonchi. Abdominal: Soft, nontender, nondistended. Bowel sounds active throughout.  Extremities: no clubbing,  cyanosis, or edema Neurological: Alert and oriented to person place and time. Skin: Skin is warm and dry. Marland Kitchen Psychiatric: Normal mood and affect. Behavior is normal.      Assessment & Plan:  45 year old with constipation, NASH, IBS who is here for follow-up.   1. Chronic constipation with IBS -- lack of response to Linzess at maximum dose. I recommended we discontinue Linzess and try Trulance 3 mg daily.  Continue high-fiber diet with liberal fluid intake.  2. Rectal bleeding -- intermittent and felt secondary to #1. This should improve with improvement constipation but if not she would be a candidate for hemorrhoidal banding in the future.  3. NASH -- continue vitamin E supplementation, diet and exercise. Monitor liver enzymes and INR serially through primary care. Ultrasound elastography recommended after last visit, this was never performed. We will can do this further. I recommend she continue to strictly avoid alcohol  3-4 month follow-up, sooner if necessary 15 minutes spent with the patient today. Greater than 50% was spent in counseling and coordination of care with the patient

## 2015-12-30 NOTE — Patient Instructions (Addendum)
Please discontinue Linzess.  We have sent the following medications to your pharmacy for you to pick up at your convenience: Trulance 3 mg once daily  Follow up with Dr Hilarie Fredrickson in 3-4 months.  If you are age 47 or older, your body mass index should be between 23-30. Your Body mass index is 33.35 kg/(m^2). If this is out of the aforementioned range listed, please consider follow up with your Primary Care Provider.  If you are age 36 or younger, your body mass index should be between 19-25. Your Body mass index is 33.35 kg/(m^2). If this is out of the aformentioned range listed, please consider follow up with your Primary Care Provider.

## 2016-01-04 ENCOUNTER — Telehealth: Payer: Self-pay | Admitting: *Deleted

## 2016-01-04 NOTE — Telephone Encounter (Signed)
Patient states that she was actually able to get Trulance for $25.00 already. Therefore, she will go forward with the Trulance medication and if any problems in the future, will call us back at which point we may have to try amitiza.

## 2016-01-04 NOTE — Telephone Encounter (Signed)
[facility] has denied Trulance. I advised on prior auth form that patient has tried and failed Linzess and has chronic constipation. Other suggestions? Amitiza?

## 2016-01-04 NOTE — Telephone Encounter (Signed)
Let patient know her insurance denied the new med Can try Amitiza 24 mcg BID Let me know in 3-4 weeks if not helping

## 2016-01-19 ENCOUNTER — Other Ambulatory Visit: Payer: Self-pay | Admitting: Family Medicine

## 2016-02-10 ENCOUNTER — Ambulatory Visit (HOSPITAL_COMMUNITY)
Admission: RE | Admit: 2016-02-10 | Discharge: 2016-02-10 | Disposition: A | Discharge status: Home / Self Care | Payer: BLUE CROSS/BLUE SHIELD | Source: Ambulatory Visit | Attending: Internal Medicine | Admitting: Internal Medicine

## 2016-02-10 DIAGNOSIS — K7581 Nonalcoholic steatohepatitis (NASH): Secondary | ICD-10-CM

## 2016-02-15 ENCOUNTER — Telehealth: Payer: Self-pay | Admitting: Internal Medicine

## 2016-02-15 ENCOUNTER — Ambulatory Visit (HOSPITAL_COMMUNITY)
Admission: RE | Admit: 2016-02-15 | Discharge: 2016-02-15 | Disposition: A | Discharge status: Home / Self Care | Payer: BLUE CROSS/BLUE SHIELD | Source: Ambulatory Visit | Attending: Internal Medicine | Admitting: Internal Medicine

## 2016-02-15 DIAGNOSIS — N281 Cyst of kidney, acquired: Secondary | ICD-10-CM | POA: Diagnosis not present

## 2016-02-15 DIAGNOSIS — K7581 Nonalcoholic steatohepatitis (NASH): Secondary | ICD-10-CM | POA: Insufficient documentation

## 2016-02-15 DIAGNOSIS — R932 Abnormal findings on diagnostic imaging of liver and biliary tract: Secondary | ICD-10-CM | POA: Diagnosis not present

## 2016-02-15 NOTE — Telephone Encounter (Signed)
Pt was given the information again, was not able to write anything down when she spoke with [facility] All questions were answered and she will call back with further concerns

## 2016-03-14 ENCOUNTER — Encounter: Payer: Self-pay | Admitting: *Deleted

## 2016-03-15 ENCOUNTER — Encounter: Payer: Self-pay | Admitting: Internal Medicine

## 2016-03-15 ENCOUNTER — Other Ambulatory Visit (INDEPENDENT_AMBULATORY_CARE_PROVIDER_SITE_OTHER): Payer: BLUE CROSS/BLUE SHIELD

## 2016-03-15 ENCOUNTER — Ambulatory Visit (INDEPENDENT_AMBULATORY_CARE_PROVIDER_SITE_OTHER): Payer: BLUE CROSS/BLUE SHIELD | Admitting: Internal Medicine

## 2016-03-15 VITALS — BP 136/96 | HR 96 | Ht 64.5 in | Wt 196.4 lb

## 2016-03-15 DIAGNOSIS — K760 Fatty (change of) liver, not elsewhere classified: Secondary | ICD-10-CM

## 2016-03-15 DIAGNOSIS — K7460 Unspecified cirrhosis of liver: Secondary | ICD-10-CM | POA: Diagnosis not present

## 2016-03-15 DIAGNOSIS — Z23 Encounter for immunization: Secondary | ICD-10-CM

## 2016-03-15 DIAGNOSIS — K5900 Constipation, unspecified: Secondary | ICD-10-CM | POA: Diagnosis not present

## 2016-03-15 DIAGNOSIS — K589 Irritable bowel syndrome without diarrhea: Secondary | ICD-10-CM | POA: Diagnosis not present

## 2016-03-15 LAB — COMPREHENSIVE METABOLIC PANEL
ALBUMIN: 4.8 g/dL (ref 3.5–5.2)
ALK PHOS: 117 U/L (ref 39–117)
ALT: 58 U/L — ABNORMAL HIGH (ref 0–35)
AST: 88 U/L — ABNORMAL HIGH (ref 0–37)
BUN: 13 mg/dL (ref 6–23)
CO2: 26 mEq/L (ref 19–32)
Calcium: 9.8 mg/dL (ref 8.4–10.5)
Chloride: 102 mEq/L (ref 96–112)
Creatinine, Ser: 0.96 mg/dL (ref 0.40–1.20)
GFR: 66.27 mL/min (ref >60.00)
Glucose, Bld: 144 mg/dL — ABNORMAL HIGH (ref 70–99)
POTASSIUM: 4.5 meq/L (ref 3.5–5.1)
SODIUM: 138 meq/L (ref 135–145)
TOTAL PROTEIN: 8.8 g/dL — AB (ref 6.0–8.3)
Total Bilirubin: 1.1 mg/dL (ref 0.2–1.2)

## 2016-03-15 LAB — CBC WITH DIFFERENTIAL/PLATELET
Basophils Absolute: 0 10*3/uL (ref 0.0–0.1)
Basophils Relative: 0.4 % (ref 0.0–3.0)
EOS PCT: 1.7 % (ref 0.0–5.0)
Eosinophils Absolute: 0.2 10*3/uL (ref 0.0–0.7)
HEMATOCRIT: 43.3 % (ref 36.0–46.0)
Hemoglobin: 14.8 g/dL (ref 12.0–15.0)
LYMPHS ABS: 4.2 10*3/uL — AB (ref 0.7–4.0)
Lymphocytes Relative: 32.3 % (ref 12.0–46.0)
MCHC: 34.2 g/dL (ref 30.0–36.0)
MCV: 89.4 fl (ref 78.0–100.0)
MONOS PCT: 6.8 % (ref 3.0–12.0)
Monocytes Absolute: 0.9 10*3/uL (ref 0.1–1.0)
NEUTROS ABS: 7.7 10*3/uL (ref 1.4–7.7)
Neutrophils Relative %: 58.8 % (ref 43.0–77.0)
PLATELETS: 346 10*3/uL (ref 150.0–400.0)
RBC: 4.84 Mil/uL (ref 3.87–5.11)
RDW: 13.3 % (ref 11.5–15.5)
WBC: 13.1 10*3/uL — ABNORMAL HIGH (ref 4.0–10.5)

## 2016-03-15 LAB — PROTIME-INR
INR: 1.1 ratio — ABNORMAL HIGH (ref 0.8–1.0)
Prothrombin Time: 11.5 s (ref 9.6–13.1)

## 2016-03-15 NOTE — Patient Instructions (Addendum)
Your physician has requested that you go to the basement for the following lab work before leaving today: CBC, CMP, INR, IgG  You will be due for ultrasound of the liver in January 2018. We will contact you with an appointment when it gets closer to that time. We will also scheduled you for an office follow up shortly after the ultrasound.  You have been scheduled for an endoscopy. Please follow written instructions given to you at your visit today. If you use inhalers (even only as needed), please bring them with you on the day of your procedure. Your physician has requested that you go to www.startemmi.com and enter the access code given to you at your visit today. This web site gives a general overview about your procedure. However, you should still follow specific instructions given to you by our office regarding your preparation for the procedure.  We have given you your first Twinrix (hepatitis a/b) injection today. Your next injection is scheduled for 04/14/16 @ 10 am  Please avoid all alcohol.  Follow aggressive risk factor modification, including diet, exercise, maintenance of cholesterol, blood sugar and blood pressure.  Continue Trulance.  If you are age 42 or older, your body mass index should be between 23-30. Your Body mass index is 33.19 kg/m. If this is out of the aforementioned range listed, please consider follow up with your Primary Care Provider.  If you are age 14 or younger, your body mass index should be between 19-25. Your Body mass index is 33.19 kg/m. If this is out of the aformentioned range listed, please consider follow up with your Primary Care Provider.

## 2016-03-15 NOTE — Progress Notes (Signed)
Subjective:    Patient ID: [name], female    DOB: Dec 14, 1970, 45 y.o.   MRN: [id number]  HPI [name] is a 45 year old female with a history of NASH with advanced fibrosis, constipation and IBS who is here for follow-up. She was initially seen in March 2017 last in the office on 12/30/2015. Since her last visit she has had an ultrasound elastography and returns to discuss these results. She is here today with her mother and boyfriend.  She reports on the whole she is feeling somewhat better. She was dealing with fatigue, inability to lose weight and also abdominal bloating. She's also struggle with constipation. She's been taking Trulance 3 mg daily since last being seen here. This is been more helpful for her than Linzess. She's having a bowel movement every other day and no longer feeling constipated. She does continue probiotics. She is following with [facility] and taking multiple supplements. She's been avoiding gluten and dairy products.  She denies jaundice, itching, lower extremity edema and increasing abdominal girth. She does still fill intermittent abdominal bloating after eating. This is usually resolved by morning. No easy bruising or bleeding.  She does drink alcohol though does so rarely and socially only. She denies a history of heavy alcohol use. No tobacco or illicit drugs.  She has struggled to lose weight and has tried increase her exercise regimen.   Review of Systems As per history of present illness, otherwise negative    Objective:   Physical Exam BP (!) 136/96 (BP Location: Right Arm, Patient Position: Sitting, Cuff Size: Normal)   Pulse 96   Ht 5' 4.5" (1.638 m) Comment: height measured without shoes  Wt 196 lb 6 oz (89.1 kg)   BMI 33.19 kg/m  Constitutional: Well-developed and well-nourished. No distress. HEENT: Normocephalic and atraumatic. Oropharynx is clear and moist. No oropharyngeal exudate. Conjunctivae are normal.  No  scleral icterus. Neck: Neck supple. Trachea midline. Cardiovascular: Normal rate, regular rhythm and intact distal pulses. No M/R/G Pulmonary/chest: Effort normal and breath sounds normal. No wheezing, rales or rhonchi. Abdominal: Soft, obese, nontender, nondistended. Bowel sounds active throughout. There are no masses palpable. No hepatosplenomegaly. Extremities: no clubbing, cyanosis, or edema Lymphadenopathy: No cervical adenopathy noted. Neurological: Alert and oriented to person place and time. Skin: Skin is warm and dry. No rashes noted. Psychiatric: Normal mood and affect. Behavior is normal.  CBC    Component Value Date/Time   WBC 13.1 (H) 03/15/2016 1044   RBC 4.84 03/15/2016 1044   HGB 14.8 03/15/2016 1044   HCT 43.3 03/15/2016 1044   PLT 346.0 03/15/2016 1044   MCV 89.4 03/15/2016 1044   MCH 31.7 12/31/2012 1045   MCHC 34.2 03/15/2016 1044   RDW 13.3 03/15/2016 1044   LYMPHSABS 4.2 (H) 03/15/2016 1044   MONOABS 0.9 03/15/2016 1044   EOSABS 0.2 03/15/2016 1044   BASOSABS 0.0 03/15/2016 1044   CMP     Component Value Date/Time   NA 138 03/15/2016 1044   K 4.5 03/15/2016 1044   CL 102 03/15/2016 1044   CO2 26 03/15/2016 1044   GLUCOSE 144 (H) 03/15/2016 1044   BUN 13 03/15/2016 1044   CREATININE 0.96 03/15/2016 1044   CREATININE 0.82 01/15/2015 2351   CALCIUM 9.8 03/15/2016 1044   PROT 8.8 (H) 03/15/2016 1044   ALBUMIN 4.8 03/15/2016 1044   AST 88 (H) 03/15/2016 1044   ALT 58 (H) 03/15/2016 1044   ALKPHOS 117 03/15/2016 1044  BILITOT 1.1 03/15/2016 1044   GFRNONAA 75 (L) 12/31/2012 1045   GFRAA 87 (L) 12/31/2012 1045   Lab Results  Component Value Date   INR 1.1 (H) 03/15/2016   INR 0.91 06/09/2010     CLINICAL DATA:  NASH x5 years EXAM: ULTRASOUND ABDOMEN COMPLETE ULTRASOUND HEPATIC ELASTOGRAPHY TECHNIQUE: Sonography of the upper abdomen was performed. In addition, ultrasound elastography evaluation of the liver was performed. A region of  interest was placed within the right lobe of the liver. Following application of a compressive sonographic pulse, shear waves were detected in the adjacent hepatic tissue and the shear wave velocity was calculated. Multiple assessments were performed at the selected site. Median shear wave velocity is correlated to a Metavir fibrosis score. COMPARISON:  Abdominal ultrasound dated 04/16/2013 FINDINGS: ULTRASOUND ABDOMEN Gallbladder: No gallstones, gallbladder wall thickening, or pericholecystic fluid. Negative sonographic Murphy's sign. Common bile duct: Diameter: Hyperechoic hepatic parenchyma/coarse hepatic echotexture with a mildly nodular hepatic contour (image 42). No focal hepatic lesion is seen. Liver: No focal lesion identified. Within normal limits in parenchymal echogenicity. IVC: No abnormality visualized. Pancreas: Visualized portion unremarkable. Spleen: Size and appearance within normal limits. Right Kidney: Length: 10.9 cm. No mass or hydronephrosis. Left Kidney: Length: 11.0 cm. 2.7 x 1.6 x 2.3 cm mildly irregular upper pole cyst. 3.3 x 2.2 x 3.4 cm mildly irregular lower pole cyst, although simple on prior MRI. No hydronephrosis. Abdominal aorta: No aneurysm visualized. Other findings: None. ULTRASOUND HEPATIC ELASTOGRAPHY Device: Siemens Helix VTQ Patient position:  Left Lateral Decubitus Transducer 6C1 Number of measurements:  10 Hepatic Segment:  8 Median velocity:   3.01  m/sec IQR: 0.27 IQR/Median velocity ratio 0.089 Corresponding Metavir fibrosis score:  Some F3 + F4 Risk of fibrosis: High Limitations of exam: None Pertinent findings noted on other imaging exams:  None Please note that abnormal shear wave velocities may also be identified in clinical settings other than with hepatic fibrosis, such as: acute hepatitis, elevated right heart and central venous pressures including use of beta blockers, veno-occlusive disease (Budd-Chiari), infiltrative  processes such as mastocytosis/amyloidosis/infiltrative tumor, extrahepatic cholestasis, in the post-prandial state, and liver transplantation. Correlation with patient history, laboratory data, and clinical condition recommended. IMPRESSION: Coarse hepatic echotexture with mildly nodular hepatic contour, suggesting early cirrhosis. Superimposed hepatic steatosis is possible. No focal hepatic lesion is seen. Two left renal cysts measuring up to 3.4 cm, mildly irregular on ultrasound, although relatively simple on prior lumbar spine MRI. No hydronephrosis. Median hepatic shear wave velocity is calculated at 3.01 m/sec. Corresponding Metavir fibrosis score is Some F3 + F4. Risk of fibrosis is high. Follow-up:  Followup Advised Electronically Signed   By: Julian Hy M.D.   On: 02/15/2016 09:51      Assessment & Plan:  45 year old female with a history of NASH with advanced fibrosis, constipation and IBS who is here for follow-up.   1. NASH (bx proven 2011) with probable cirrhosis -- we spent a great developed time today reviewing NASH and cirrhosis. She has at least advanced fibrosis if not cirrhosis by elastography. No evidence of HCC. Disease is very well compensated at present. Check CBC, CMP and INR today. She will need [place] screening every 6 months by ultrasound. I recommended upper endoscopy for variceal screening which we will schedule today. We discussed the risk, benefit and alternative and she wishes to proceed. I've recommended she avoid alcohol completely and that no amount of alcohol is considered safe for her. She voices understanding. We will begin Twinrix  vaccination series today. --We discussed the importance of diet, exercise, weight loss, and controlling risk factors specifically hypertension, hyperlipidemia, and hyperglycemia --We discussed referral to [facility] for consideration of clinical trial in their fatty liver clinic. She will think about this and we can discuss  this more in the future --Continue vitamin E 800 international units daily  2. Chronic constipation/IBS -- continue Trulance 3 mg daily  3. CRC screening/family history of colon cancer -- normal colonoscopy April 2017, repeat in 5 years  45 minutes spent with the patient today. Greater than 50% was spent in counseling and coordination of care with the patient 6 month ROV

## 2016-03-16 ENCOUNTER — Other Ambulatory Visit: Payer: Self-pay | Admitting: Neurosurgery

## 2016-03-16 DIAGNOSIS — Z981 Arthrodesis status: Secondary | ICD-10-CM

## 2016-03-16 LAB — IGG: IgG (Immunoglobin G), Serum: 1362 mg/dL (ref 694–1618)

## 2016-03-17 ENCOUNTER — Other Ambulatory Visit: Payer: Self-pay

## 2016-03-17 ENCOUNTER — Encounter: Payer: Self-pay | Admitting: Internal Medicine

## 2016-03-17 DIAGNOSIS — K760 Fatty (change of) liver, not elsewhere classified: Secondary | ICD-10-CM

## 2016-03-23 ENCOUNTER — Ambulatory Visit (AMBULATORY_SURGERY_CENTER): Payer: BLUE CROSS/BLUE SHIELD | Admitting: Internal Medicine

## 2016-03-23 ENCOUNTER — Encounter: Payer: Self-pay | Admitting: Internal Medicine

## 2016-03-23 VITALS — BP 125/84 | HR 75 | Temp 98.9°F | Resp 12 | Ht 64.5 in | Wt 196.0 lb

## 2016-03-23 DIAGNOSIS — K7460 Unspecified cirrhosis of liver: Secondary | ICD-10-CM

## 2016-03-23 DIAGNOSIS — K7581 Nonalcoholic steatohepatitis (NASH): Secondary | ICD-10-CM

## 2016-03-23 MED ORDER — SODIUM CHLORIDE 0.9 % IV SOLN: 500.0000 mL | INTRAVENOUS | Status: DC

## 2016-03-23 NOTE — Op Note (Signed)
[facility] Patient Name: [name] Procedure Date: 03/23/2016 10:36 AM MRN: [id number] Endoscopist: Jerene Bears , MD Age: 45 Referring MD:  Date of Birth: January 19, 1971 Gender: Female Account #: [id number] Procedure:                Upper GI endoscopy Indications:              NASH with advanced fibrosis, rule out esophageal                            varices Medicines:                Monitored Anesthesia Care Procedure:                Pre-Anesthesia Assessment:                           - Prior to the procedure, a History and Physical                            was performed, and patient medications and                            allergies were reviewed. The patient's tolerance of                            previous anesthesia was also reviewed. The risks                            and benefits of the procedure and the sedation                            options and risks were discussed with the patient.                            All questions were answered, and informed consent                            was obtained. Prior Anticoagulants: The patient has                            taken no previous anticoagulant or antiplatelet                            agents. ASA Grade Assessment: II - A patient with                            mild systemic disease. After reviewing the risks                            and benefits, the patient was deemed in                            satisfactory condition to undergo the procedure.  After obtaining informed consent, the endoscope was                            passed under direct vision. Throughout the                            procedure, the patient's blood pressure, pulse, and                            oxygen saturations were monitored continuously. The                            Model GIF-HQ190 [id number]) scope was introduced                            through the mouth, and advanced to the second  part                            of duodenum. The upper GI endoscopy was                            accomplished without difficulty. The patient                            tolerated the procedure well. Scope In: Scope Out: Findings:                 The esophagus was normal.                           The stomach was normal.                           The examined duodenum was normal. Complications:            No immediate complications. Estimated Blood Loss:     Estimated blood loss: none. Impression:               - Normal esophagus.                           - Normal stomach.                           - Normal examined duodenum.                           - No evidence of esophageal or gastric varices. Recommendation:           - Patient has a contact number available for                            emergencies. The signs and symptoms of potential                            delayed complications were discussed with the  patient. Return to normal activities tomorrow.                            Written discharge instructions were provided to the                            patient.                           - Resume previous diet.                           - Continue present medications.                           - Repeat upper endoscopy in 3 years for varcieal                            screening purposes, sooner if disease becomes                            decompensated. Jerene Bears, MD 03/23/2016 10:54:08 AM This report has been signed electronically.

## 2016-03-23 NOTE — Progress Notes (Signed)
No egg or soy allergy known to patient  No issues with past sedation with any surgeries  or procedures, no intubation problems  No diet pills per patient No home 02 use per patient  No blood thinners per patient  Pt denies issues with constipation  No A fib or A flutter

## 2016-03-23 NOTE — Patient Instructions (Signed)
Findings: Normal Recommendations:  Resume previous diet, Continue current medications, Repeat upper Endoscopy in 3 years  YOU HAD AN ENDOSCOPIC PROCEDURE TODAY AT [place]:   Refer to the procedure report that was given to you for any specific questions about what was found during the examination.  If the procedure report does not answer your questions, please call your gastroenterologist to clarify.  If you requested that your care partner not be given the details of your procedure findings, then the procedure report has been included in a sealed envelope for you to review at your convenience later.  YOU SHOULD EXPECT: Some feelings of bloating in the abdomen. Passage of more gas than usual.  Walking can help get rid of the air that was put into your GI tract during the procedure and reduce the bloating. If you had a lower endoscopy (such as a colonoscopy or flexible sigmoidoscopy) you may notice spotting of blood in your stool or on the toilet paper. If you underwent a bowel prep for your procedure, you may not have a normal bowel movement for a few days.  Please Note:  You might notice some irritation and congestion in your nose or some drainage.  This is from the oxygen used during your procedure.  There is no need for concern and it should clear up in a day or so.  SYMPTOMS TO REPORT IMMEDIATELY:   Following lower endoscopy (colonoscopy or flexible sigmoidoscopy):  Excessive amounts of blood in the stool  Significant tenderness or worsening of abdominal pains  Swelling of the abdomen that is new, acute  Fever of 100F or higher   Following upper endoscopy (EGD)  Vomiting of blood or coffee ground material  New chest pain or pain under the shoulder blades  Painful or persistently difficult swallowing  New shortness of breath  Fever of 100F or higher  Black, tarry-looking stools  For urgent or emergent issues, a gastroenterologist can be reached at any hour by  calling [phone].   DIET:  We do recommend a small meal at first, but then you may proceed to your regular diet.  Drink plenty of fluids but you should avoid alcoholic beverages for 24 hours.  ACTIVITY:  You should plan to take it easy for the rest of today and you should NOT DRIVE or use heavy machinery until tomorrow (because of the sedation medicines used during the test).    FOLLOW UP: Our staff will call the number listed on your records the next business day following your procedure to check on you and address any questions or concerns that you may have regarding the information given to you following your procedure. If we do not reach you, we will leave a message.  However, if you are feeling well and you are not experiencing any problems, there is no need to return our call.  We will assume that you have returned to your regular daily activities without incident.  If any biopsies were taken you will be contacted by phone or by letter within the next 1-3 weeks.  Please call us at [phone] if you have not heard about the biopsies in 3 weeks.    SIGNATURES/CONFIDENTIALITY: You and/or your care partner have signed paperwork which will be entered into your electronic medical record.  These signatures attest to the fact that that the information above on your After Visit Summary has been reviewed and is understood.  Full responsibility of the confidentiality of this discharge information lies  with you and/or your care-partner.

## 2016-03-23 NOTE — Progress Notes (Signed)
A/ox3 pleased with MAC, report to Tracy RN 

## 2016-03-24 ENCOUNTER — Telehealth: Payer: Self-pay

## 2016-03-24 NOTE — Telephone Encounter (Signed)
  Follow up Call-  Call back number 03/23/2016 10/27/2015  Post procedure Call Back phone  # [phone]  Permission to leave phone message Yes Yes  Some recent data might be hidden     Patient questions:  Do you have a fever, pain , or abdominal swelling? No. Pain Score  0 *  Have you tolerated food without any problems? Yes.    Have you been able to return to your normal activities? Yes.    Do you have any questions about your discharge instructions: Diet   No. Medications  No. Follow up visit  No.  Do you have questions or concerns about your Care? No.  Actions: * If pain score is 4 or above: No action needed, pain <4.

## 2016-03-25 ENCOUNTER — Ambulatory Visit
Admission: RE | Admit: 2016-03-25 | Discharge: 2016-03-25 | Disposition: A | Discharge status: Home / Self Care | Payer: BLUE CROSS/BLUE SHIELD | Source: Ambulatory Visit | Attending: Neurosurgery | Admitting: Neurosurgery

## 2016-03-25 DIAGNOSIS — Z981 Arthrodesis status: Secondary | ICD-10-CM

## 2016-03-30 ENCOUNTER — Other Ambulatory Visit (INDEPENDENT_AMBULATORY_CARE_PROVIDER_SITE_OTHER): Payer: BLUE CROSS/BLUE SHIELD

## 2016-03-30 DIAGNOSIS — K760 Fatty (change of) liver, not elsewhere classified: Secondary | ICD-10-CM

## 2016-03-30 LAB — CBC WITH DIFFERENTIAL/PLATELET
BASOS ABS: 0 10*3/uL (ref 0.0–0.1)
Basophils Relative: 0.4 % (ref 0.0–3.0)
EOS ABS: 0.2 10*3/uL (ref 0.0–0.7)
Eosinophils Relative: 2 % (ref 0.0–5.0)
HEMATOCRIT: 43 % (ref 36.0–46.0)
HEMOGLOBIN: 14.7 g/dL (ref 12.0–15.0)
LYMPHS PCT: 34.2 % (ref 12.0–46.0)
Lymphs Abs: 3.6 10*3/uL (ref 0.7–4.0)
MCHC: 34.3 g/dL (ref 30.0–36.0)
MCV: 89.8 fl (ref 78.0–100.0)
MONO ABS: 0.7 10*3/uL (ref 0.1–1.0)
Monocytes Relative: 7 % (ref 3.0–12.0)
Neutro Abs: 6 10*3/uL (ref 1.4–7.7)
Neutrophils Relative %: 56.4 % (ref 43.0–77.0)
PLATELETS: 318 10*3/uL (ref 150.0–400.0)
RBC: 4.79 Mil/uL (ref 3.87–5.11)
RDW: 13.4 % (ref 11.5–15.5)
WBC: 10.6 10*3/uL — AB (ref 4.0–10.5)

## 2016-03-31 ENCOUNTER — Other Ambulatory Visit: Payer: Self-pay | Admitting: *Deleted

## 2016-03-31 MED ORDER — PLECANATIDE 3 MG PO TABS: 1.0000 | ORAL_TABLET | Freq: Every day | ORAL | 1 refills | Status: DC

## 2016-04-01 LAB — PROTEIN ELECTROPHORESIS, SERUM
ALBUMIN ELP: 4.6 g/dL (ref 3.8–4.8)
ALPHA-1-GLOBULIN: 0.3 g/dL (ref 0.2–0.3)
ALPHA-2-GLOBULIN: 0.8 g/dL (ref 0.5–0.9)
BETA GLOBULIN: 0.6 g/dL (ref 0.4–0.6)
Beta 2: 0.6 g/dL — ABNORMAL HIGH (ref 0.2–0.5)
Gamma Globulin: 1.3 g/dL (ref 0.8–1.7)
Total Protein, Serum Electrophoresis: 8.2 g/dL — ABNORMAL HIGH (ref 6.1–8.1)

## 2016-04-04 ENCOUNTER — Other Ambulatory Visit: Payer: Self-pay

## 2016-04-04 DIAGNOSIS — R779 Abnormality of plasma protein, unspecified: Secondary | ICD-10-CM

## 2016-04-06 ENCOUNTER — Other Ambulatory Visit: Payer: BLUE CROSS/BLUE SHIELD

## 2016-04-06 DIAGNOSIS — R779 Abnormality of plasma protein, unspecified: Secondary | ICD-10-CM

## 2016-04-11 LAB — IMMUNOFIXATION, SERUM
IGA/IMMUNOGLOBULIN A, SERUM: 474 mg/dL — AB (ref 87–352)
IGM (IMMUNOGLOBULIN M), SRM: 126 mg/dL (ref 26–217)
IgG (Immunoglobin G), Serum: 1205 mg/dL (ref 700–1600)

## 2016-04-13 ENCOUNTER — Other Ambulatory Visit: Payer: Self-pay | Admitting: Neurosurgery

## 2016-04-13 ENCOUNTER — Ambulatory Visit: Payer: BLUE CROSS/BLUE SHIELD | Admitting: Internal Medicine

## 2016-04-14 ENCOUNTER — Other Ambulatory Visit: Payer: Self-pay

## 2016-04-14 ENCOUNTER — Telehealth: Payer: Self-pay | Admitting: Internal Medicine

## 2016-04-14 ENCOUNTER — Other Ambulatory Visit: Payer: Self-pay | Admitting: Family Medicine

## 2016-04-14 ENCOUNTER — Encounter: Payer: BLUE CROSS/BLUE SHIELD | Admitting: Internal Medicine

## 2016-04-14 NOTE — Telephone Encounter (Signed)
Pt calling requesting lab results from last week. Please advise.

## 2016-04-19 ENCOUNTER — Other Ambulatory Visit: Payer: Self-pay | Admitting: Family Medicine

## 2016-04-19 NOTE — Telephone Encounter (Signed)
This medication is outside my scope. The patient will need to get from her gynecologist or will need to bring this up at office visit

## 2016-04-19 NOTE — Telephone Encounter (Signed)
See result note.  

## 2016-04-19 NOTE — Telephone Encounter (Signed)
Pt aware, see result note. Pt should follow-up in 6 mth per last OV note.

## 2016-05-03 ENCOUNTER — Other Ambulatory Visit: Payer: Self-pay | Admitting: Family Medicine

## 2016-05-04 NOTE — Telephone Encounter (Signed)
May have 4 refills each

## 2016-05-25 ENCOUNTER — Telehealth: Payer: Self-pay | Admitting: *Deleted

## 2016-05-25 DIAGNOSIS — K7581 Nonalcoholic steatohepatitis (NASH): Secondary | ICD-10-CM

## 2016-05-25 NOTE — Telephone Encounter (Signed)
-----   Message from [name], [place] sent at 03/15/2016 11:08 AM EDT ----- Needs liver ultrasound (NOT with elastography) for [place] screening around 07/2016. Will need office visit to follow (around 2 weeks after)... Call and schedule then tell pt. See 8/22 office note

## 2016-05-25 NOTE — Telephone Encounter (Signed)
Patient is scheduled for abdominal ultrasound at 11 am 08/08/16 at [facility], arrive at 10:45 am. NPO 6 hours prior. Patient is also scheduled for office follow up with Dr Hilarie Fredrickson on 08/25/16 @ 8:45 am. Patient verbalizes understanding.

## 2016-06-24 NOTE — Pre-Procedure Instructions (Signed)
[name]  06/24/2016      [id number] [address]Phone: [phone]: [phone]   Your procedure is scheduled on Mon, Dec 11 @ 7:30 AM  [facility]  Call this number if you have problems the morning of surgery:  [phone]   Remember:  Do not eat food or drink liquids after midnight.  Take these medicines the morning of surgery with A SIP OF WATER Zovirax(Acyclovir),ProAir<Bring Your Inhaler With You>,Celexa(Citalopram),Klonopin(Clonazepam),Flonase(Fluticasone),and Pain Pill(if needed)             Stop taking your Ibuprofen,Fish Oil,Krill Oil,Vit E,Tumeric along with any other Vitamins or Herbal Medications. No Goody's,BC's,Aleve,Advil,or Motrin.    Do not wear jewelry, make-up or nail polish.  Do not wear lotions, powders,perfumes, or deoderant.  Do not shave 48 hours prior to surgery.    Do not bring valuables to the hospital.  [facility] or [facility].  Contacts, dentures or bridgework may not be worn into surgery.  Leave your suitcase in the car.  After surgery it may be brought to your room.  For patients admitted to the hospital, discharge time will be determined by your treatment team.  Patients discharged the day of surgery will not be allowed to drive home.    [facility]  Before surgery, you can play an important role.  Because skin is not sterile, your skin needs to be as free of germs as possible.  You can reduce the number of germs on you skin by washing with CHG (chlorahexidine gluconate) soap before surgery.  CHG is an antiseptic cleaner which kills germs and bonds with the skin to continue killing germs even after washing.  Please DO NOT use if you have an allergy to CHG or antibacterial soaps.  If your skin becomes reddened/irritated stop using  the CHG and inform your nurse when you arrive at Short Stay.  Do not shave (including legs and underarms) for at least 48 hours prior to the first CHG shower.  You may shave your face.  Please follow these instructions carefully:   1.  Shower with CHG Soap the night before surgery and the                                morning of Surgery.  2.  If you choose to wash your hair, wash your hair first as usual with your       normal shampoo.  3.  After you shampoo, rinse your hair and body thoroughly to remove the                      Shampoo.  4.  Use CHG as you would any other liquid soap.  You can apply chg directly       to the skin and wash gently with scrungie or a clean washcloth.  5.  Apply the CHG Soap to your body ONLY FROM THE NECK DOWN.        Do not use on open wounds or open sores.  Avoid contact with your eyes,       ears, mouth and genitals (private parts).  Wash genitals (private parts)       with your normal soap.  6.  Wash  thoroughly, paying special attention to the area where your surgery        will be performed.  7.  Thoroughly rinse your body with warm water from the neck down.  8.  DO NOT shower/wash with your normal soap after using and rinsing off       the CHG Soap.  9.  Pat yourself dry with a clean towel.            10.  Wear clean pajamas.            11.  Place clean sheets on your bed the night of your first shower and do not        sleep with pets.  Day of Surgery  Do not apply any lotions/deoderants the morning of surgery.  Please wear clean clothes to the hospital/surgery center.    Please read over the following fact sheets that you were given. Pain Booklet, Coughing and Deep Breathing, MRSA Information and Surgical Site Infection Prevention

## 2016-06-27 ENCOUNTER — Other Ambulatory Visit: Payer: Self-pay | Admitting: Internal Medicine

## 2016-06-27 ENCOUNTER — Encounter (HOSPITAL_COMMUNITY): Payer: Self-pay

## 2016-06-27 ENCOUNTER — Encounter (HOSPITAL_COMMUNITY)
Admission: RE | Admit: 2016-06-27 | Discharge: 2016-06-27 | Disposition: A | Discharge status: Home / Self Care | Payer: BLUE CROSS/BLUE SHIELD | Source: Ambulatory Visit | Attending: Neurosurgery | Admitting: Neurosurgery

## 2016-06-27 ENCOUNTER — Other Ambulatory Visit: Payer: Self-pay | Admitting: Family Medicine

## 2016-06-27 DIAGNOSIS — G4730 Sleep apnea, unspecified: Secondary | ICD-10-CM | POA: Diagnosis not present

## 2016-06-27 DIAGNOSIS — E669 Obesity, unspecified: Secondary | ICD-10-CM | POA: Diagnosis not present

## 2016-06-27 DIAGNOSIS — M797 Fibromyalgia: Secondary | ICD-10-CM | POA: Insufficient documentation

## 2016-06-27 DIAGNOSIS — E785 Hyperlipidemia, unspecified: Secondary | ICD-10-CM | POA: Insufficient documentation

## 2016-06-27 DIAGNOSIS — I10 Essential (primary) hypertension: Secondary | ICD-10-CM | POA: Insufficient documentation

## 2016-06-27 DIAGNOSIS — N281 Cyst of kidney, acquired: Secondary | ICD-10-CM | POA: Diagnosis not present

## 2016-06-27 DIAGNOSIS — R5381 Other malaise: Secondary | ICD-10-CM | POA: Diagnosis not present

## 2016-06-27 DIAGNOSIS — Z01812 Encounter for preprocedural laboratory examination: Secondary | ICD-10-CM | POA: Insufficient documentation

## 2016-06-27 DIAGNOSIS — R739 Hyperglycemia, unspecified: Secondary | ICD-10-CM | POA: Diagnosis not present

## 2016-06-27 DIAGNOSIS — K7689 Other specified diseases of liver: Secondary | ICD-10-CM | POA: Diagnosis not present

## 2016-06-27 DIAGNOSIS — K219 Gastro-esophageal reflux disease without esophagitis: Secondary | ICD-10-CM | POA: Insufficient documentation

## 2016-06-27 DIAGNOSIS — K560 Paralytic ileus: Secondary | ICD-10-CM | POA: Insufficient documentation

## 2016-06-27 HISTORY — DX: Other specified disorders of bladder: N32.89

## 2016-06-27 HISTORY — DX: Follicular disorder, unspecified: L73.9

## 2016-06-27 HISTORY — DX: Adverse effect of unspecified anesthetic, initial encounter: T41.45XA

## 2016-06-27 HISTORY — DX: Paresthesia of skin: R20.2

## 2016-06-27 HISTORY — DX: Insomnia, unspecified: G47.00

## 2016-06-27 HISTORY — DX: Hepatic fibrosis: K74.0

## 2016-06-27 HISTORY — DX: Hepatic fibrosis, unspecified: K74.00

## 2016-06-27 HISTORY — DX: Personal history of other diseases of the respiratory system: Z87.09

## 2016-06-27 HISTORY — DX: Other complications of anesthesia, initial encounter: T88.59XA

## 2016-06-27 HISTORY — DX: Urinary tract infection, site not specified: N39.0

## 2016-06-27 HISTORY — DX: Personal history of other diseases of the nervous system and sense organs: Z86.69

## 2016-06-27 LAB — COMPREHENSIVE METABOLIC PANEL
ALBUMIN: 4.1 g/dL (ref 3.5–5.0)
ALK PHOS: 104 U/L (ref 38–126)
ALT: 46 U/L (ref 14–54)
AST: 49 U/L — AB (ref 15–41)
Anion gap: 10 (ref 5–15)
BUN: 13 mg/dL (ref 6–20)
CALCIUM: 9.5 mg/dL (ref 8.9–10.3)
CHLORIDE: 105 mmol/L (ref 101–111)
CO2: 23 mmol/L (ref 22–32)
CREATININE: 0.93 mg/dL (ref 0.44–1.00)
GFR calc non Af Amer: >60 mL/min (ref >60)
GLUCOSE: 84 mg/dL (ref 65–99)
Potassium: 3.8 mmol/L (ref 3.5–5.1)
SODIUM: 138 mmol/L (ref 135–145)
Total Bilirubin: 1.2 mg/dL (ref 0.3–1.2)
Total Protein: 8.2 g/dL — ABNORMAL HIGH (ref 6.5–8.1)

## 2016-06-27 LAB — SURGICAL PCR SCREEN
MRSA, PCR: NEGATIVE
Staphylococcus aureus: NEGATIVE

## 2016-06-27 LAB — CBC
HEMATOCRIT: 42.1 % (ref 36.0–46.0)
HEMOGLOBIN: 13.7 g/dL (ref 12.0–15.0)
MCH: 30.1 pg (ref 26.0–34.0)
MCHC: 32.5 g/dL (ref 30.0–36.0)
MCV: 92.5 fL (ref 78.0–100.0)
Platelets: 341 10*3/uL (ref 150–400)
RBC: 4.55 MIL/uL (ref 3.87–5.11)
RDW: 13.4 % (ref 11.5–15.5)
WBC: 9 10*3/uL (ref 4.0–10.5)

## 2016-06-27 LAB — NO BLOOD PRODUCTS

## 2016-06-27 MED ORDER — CHLORHEXIDINE GLUCONATE CLOTH 2 % EX PADS: 6.0000 | MEDICATED_PAD | Freq: Once | CUTANEOUS | Status: DC

## 2016-06-27 NOTE — Progress Notes (Addendum)
Cardiologist denies   Medical Md is Dr.Scott Luking  Echo denies  Stress test > 5 yrs ago.States she was on B/P meds but has been off for over 5 yrs. States was done d/t exercise induced asthma.  Heart cath denies  EKG denies in past yr  CXR denies in past yr

## 2016-06-27 NOTE — Telephone Encounter (Signed)
Patient last seen 06/01/15. May we refill?

## 2016-07-04 ENCOUNTER — Inpatient Hospital Stay (HOSPITAL_COMMUNITY): Payer: BLUE CROSS/BLUE SHIELD | Admitting: Anesthesiology

## 2016-07-04 ENCOUNTER — Encounter (HOSPITAL_COMMUNITY)
Admission: RE | Disposition: A | Discharge status: Home / Self Care | Payer: Self-pay | Source: Ambulatory Visit | Attending: Neurosurgery

## 2016-07-04 ENCOUNTER — Observation Stay (HOSPITAL_COMMUNITY)
Admission: RE | Admit: 2016-07-04 | Discharge: 2016-07-05 | Disposition: A | Discharge status: Home / Self Care | Payer: BLUE CROSS/BLUE SHIELD | Source: Ambulatory Visit | Attending: Neurosurgery | Admitting: Neurosurgery

## 2016-07-04 ENCOUNTER — Inpatient Hospital Stay (HOSPITAL_COMMUNITY): Payer: BLUE CROSS/BLUE SHIELD

## 2016-07-04 ENCOUNTER — Encounter (HOSPITAL_COMMUNITY): Payer: Self-pay | Admitting: *Deleted

## 2016-07-04 DIAGNOSIS — K760 Fatty (change of) liver, not elsewhere classified: Secondary | ICD-10-CM | POA: Insufficient documentation

## 2016-07-04 DIAGNOSIS — Y793 Surgical instruments, materials and orthopedic devices (including sutures) associated with adverse incidents: Secondary | ICD-10-CM | POA: Insufficient documentation

## 2016-07-04 DIAGNOSIS — Z881 Allergy status to other antibiotic agents status: Secondary | ICD-10-CM | POA: Diagnosis not present

## 2016-07-04 DIAGNOSIS — I10 Essential (primary) hypertension: Secondary | ICD-10-CM | POA: Diagnosis not present

## 2016-07-04 DIAGNOSIS — M542 Cervicalgia: Secondary | ICD-10-CM | POA: Insufficient documentation

## 2016-07-04 DIAGNOSIS — E785 Hyperlipidemia, unspecified: Secondary | ICD-10-CM | POA: Insufficient documentation

## 2016-07-04 DIAGNOSIS — M797 Fibromyalgia: Secondary | ICD-10-CM | POA: Diagnosis not present

## 2016-07-04 DIAGNOSIS — Z419 Encounter for procedure for purposes other than remedying health state, unspecified: Secondary | ICD-10-CM

## 2016-07-04 DIAGNOSIS — N3289 Other specified disorders of bladder: Secondary | ICD-10-CM | POA: Diagnosis not present

## 2016-07-04 DIAGNOSIS — Y838 Other surgical procedures as the cause of abnormal reaction of the patient, or of later complication, without mention of misadventure at the time of the procedure: Secondary | ICD-10-CM | POA: Diagnosis not present

## 2016-07-04 DIAGNOSIS — K589 Irritable bowel syndrome without diarrhea: Secondary | ICD-10-CM | POA: Diagnosis not present

## 2016-07-04 DIAGNOSIS — Z6832 Body mass index (BMI) 32.0-32.9, adult: Secondary | ICD-10-CM | POA: Insufficient documentation

## 2016-07-04 DIAGNOSIS — Z888 Allergy status to other drugs, medicaments and biological substances status: Secondary | ICD-10-CM | POA: Diagnosis not present

## 2016-07-04 DIAGNOSIS — M960 Pseudarthrosis after fusion or arthrodesis: Secondary | ICD-10-CM | POA: Diagnosis present

## 2016-07-04 DIAGNOSIS — Z833 Family history of diabetes mellitus: Secondary | ICD-10-CM | POA: Insufficient documentation

## 2016-07-04 DIAGNOSIS — F419 Anxiety disorder, unspecified: Secondary | ICD-10-CM | POA: Insufficient documentation

## 2016-07-04 DIAGNOSIS — Z87440 Personal history of urinary (tract) infections: Secondary | ICD-10-CM | POA: Diagnosis not present

## 2016-07-04 DIAGNOSIS — E669 Obesity, unspecified: Secondary | ICD-10-CM | POA: Insufficient documentation

## 2016-07-04 DIAGNOSIS — K219 Gastro-esophageal reflux disease without esophagitis: Secondary | ICD-10-CM | POA: Insufficient documentation

## 2016-07-04 DIAGNOSIS — M19041 Primary osteoarthritis, right hand: Secondary | ICD-10-CM | POA: Insufficient documentation

## 2016-07-04 DIAGNOSIS — Z8379 Family history of other diseases of the digestive system: Secondary | ICD-10-CM | POA: Insufficient documentation

## 2016-07-04 DIAGNOSIS — M19072 Primary osteoarthritis, left ankle and foot: Secondary | ICD-10-CM | POA: Diagnosis not present

## 2016-07-04 DIAGNOSIS — Z91018 Allergy to other foods: Secondary | ICD-10-CM | POA: Insufficient documentation

## 2016-07-04 DIAGNOSIS — E7800 Pure hypercholesterolemia, unspecified: Secondary | ICD-10-CM | POA: Insufficient documentation

## 2016-07-04 DIAGNOSIS — Z8249 Family history of ischemic heart disease and other diseases of the circulatory system: Secondary | ICD-10-CM | POA: Insufficient documentation

## 2016-07-04 DIAGNOSIS — J45990 Exercise induced bronchospasm: Secondary | ICD-10-CM | POA: Insufficient documentation

## 2016-07-04 DIAGNOSIS — G4700 Insomnia, unspecified: Secondary | ICD-10-CM | POA: Insufficient documentation

## 2016-07-04 DIAGNOSIS — Z811 Family history of alcohol abuse and dependence: Secondary | ICD-10-CM | POA: Insufficient documentation

## 2016-07-04 DIAGNOSIS — J45909 Unspecified asthma, uncomplicated: Secondary | ICD-10-CM | POA: Diagnosis not present

## 2016-07-04 DIAGNOSIS — M19042 Primary osteoarthritis, left hand: Secondary | ICD-10-CM | POA: Insufficient documentation

## 2016-07-04 DIAGNOSIS — S129XXA Fracture of neck, unspecified, initial encounter: Secondary | ICD-10-CM | POA: Diagnosis present

## 2016-07-04 DIAGNOSIS — G4730 Sleep apnea, unspecified: Secondary | ICD-10-CM | POA: Diagnosis not present

## 2016-07-04 DIAGNOSIS — Z90710 Acquired absence of both cervix and uterus: Secondary | ICD-10-CM | POA: Diagnosis not present

## 2016-07-04 HISTORY — PX: POSTERIOR CERVICAL FUSION/FORAMINOTOMY: SHX5038

## 2016-07-04 SURGERY — POSTERIOR CERVICAL FUSION/FORAMINOTOMY LEVEL 1
Anesthesia: General | Site: Spine Cervical

## 2016-07-04 MED ORDER — SUGAMMADEX SODIUM 200 MG/2ML IV SOLN
INTRAVENOUS | Status: DC | PRN
  Administered 2016-07-04: 200 mg via INTRAVENOUS

## 2016-07-04 MED ORDER — MONTELUKAST SODIUM 10 MG PO TABS
10.0000 mg | ORAL_TABLET | Freq: Every day | ORAL | Status: DC
  Administered 2016-07-04: 10 mg via ORAL
  Filled 2016-07-04: qty 1

## 2016-07-04 MED ORDER — FENTANYL CITRATE (PF) 100 MCG/2ML IJ SOLN
INTRAMUSCULAR | Status: AC
  Filled 2016-07-04: qty 4

## 2016-07-04 MED ORDER — LIDOCAINE 2% (20 MG/ML) 5 ML SYRINGE
INTRAMUSCULAR | Status: AC
  Filled 2016-07-04: qty 5

## 2016-07-04 MED ORDER — ARTIFICIAL TEARS OP OINT
TOPICAL_OINTMENT | OPHTHALMIC | Status: DC | PRN
  Administered 2016-07-04: 1 via OPHTHALMIC

## 2016-07-04 MED ORDER — ONDANSETRON HCL 4 MG/2ML IJ SOLN
INTRAMUSCULAR | Status: AC
  Filled 2016-07-04: qty 2

## 2016-07-04 MED ORDER — ESTRADIOL 1 MG PO TABS
1.0000 mg | ORAL_TABLET | Freq: Every day | ORAL | Status: DC
  Administered 2016-07-04 – 2016-07-05 (×2): 1 mg via ORAL
  Filled 2016-07-04 (×2): qty 1

## 2016-07-04 MED ORDER — ONDANSETRON HCL 4 MG/2ML IJ SOLN: 4.0000 mg | Freq: Four times a day (QID) | INTRAMUSCULAR | Status: DC | PRN

## 2016-07-04 MED ORDER — ACETAMINOPHEN 650 MG RE SUPP: 650.0000 mg | RECTAL | Status: DC | PRN

## 2016-07-04 MED ORDER — PRAVASTATIN SODIUM 10 MG PO TABS
10.0000 mg | ORAL_TABLET | Freq: Every day | ORAL | Status: DC
  Filled 2016-07-04 (×2): qty 1

## 2016-07-04 MED ORDER — ZOLPIDEM TARTRATE 5 MG PO TABS: 5.0000 mg | ORAL_TABLET | Freq: Every evening | ORAL | Status: DC | PRN

## 2016-07-04 MED ORDER — OXYCODONE-ACETAMINOPHEN 5-325 MG PO TABS: 1.0000 | ORAL_TABLET | ORAL | Status: DC | PRN

## 2016-07-04 MED ORDER — BUPIVACAINE HCL (PF) 0.5 % IJ SOLN
INTRAMUSCULAR | Status: AC
  Filled 2016-07-04: qty 30

## 2016-07-04 MED ORDER — THYROID 120 MG PO TABS
120.0000 mg | ORAL_TABLET | Freq: Every day | ORAL | Status: DC
  Administered 2016-07-04 – 2016-07-05 (×2): 120 mg via ORAL
  Filled 2016-07-04 (×2): qty 1

## 2016-07-04 MED ORDER — MIDAZOLAM HCL 2 MG/2ML IJ SOLN
INTRAMUSCULAR | Status: AC
Start: 2016-07-04 — End: 2016-07-04
  Filled 2016-07-04: qty 2

## 2016-07-04 MED ORDER — FENTANYL CITRATE (PF) 100 MCG/2ML IJ SOLN
25.0000 ug | INTRAMUSCULAR | Status: DC | PRN
  Administered 2016-07-04 (×2): 25 ug via INTRAVENOUS

## 2016-07-04 MED ORDER — MENTHOL 3 MG MT LOZG: 1.0000 | LOZENGE | OROMUCOSAL | Status: DC | PRN

## 2016-07-04 MED ORDER — MAGNESIUM HYDROXIDE 400 MG/5ML PO SUSP: 30.0000 mL | Freq: Every day | ORAL | Status: DC | PRN

## 2016-07-04 MED ORDER — LIDOCAINE-EPINEPHRINE (PF) 2 %-1:200000 IJ SOLN
INTRAMUSCULAR | Status: DC | PRN
  Administered 2016-07-04: 5 mL

## 2016-07-04 MED ORDER — BISACODYL 10 MG RE SUPP: 10.0000 mg | Freq: Every day | RECTAL | Status: DC | PRN

## 2016-07-04 MED ORDER — 0.9 % SODIUM CHLORIDE (POUR BTL) OPTIME
TOPICAL | Status: DC | PRN
  Administered 2016-07-04: 1000 mL

## 2016-07-04 MED ORDER — THROMBIN 20000 UNITS EX SOLR
CUTANEOUS | Status: DC | PRN
  Administered 2016-07-04: 09:00:00 via TOPICAL

## 2016-07-04 MED ORDER — OXYCODONE HCL 5 MG PO TABS
ORAL_TABLET | ORAL | Status: AC
  Filled 2016-07-04: qty 2

## 2016-07-04 MED ORDER — ACETAMINOPHEN 325 MG PO TABS: 650.0000 mg | ORAL_TABLET | ORAL | Status: DC | PRN

## 2016-07-04 MED ORDER — OXYCODONE HCL 5 MG/5ML PO SOLN: 5.0000 mg | Freq: Once | ORAL | Status: DC | PRN

## 2016-07-04 MED ORDER — BUPIVACAINE HCL (PF) 0.5 % IJ SOLN
INTRAMUSCULAR | Status: DC | PRN
  Administered 2016-07-04: 5 mL

## 2016-07-04 MED ORDER — CYCLOBENZAPRINE HCL 10 MG PO TABS
10.0000 mg | ORAL_TABLET | Freq: Three times a day (TID) | ORAL | Status: DC | PRN
  Administered 2016-07-04 (×2): 10 mg via ORAL
  Filled 2016-07-04 (×2): qty 1

## 2016-07-04 MED ORDER — KETOROLAC TROMETHAMINE 30 MG/ML IJ SOLN
INTRAMUSCULAR | Status: AC
  Filled 2016-07-04: qty 1

## 2016-07-04 MED ORDER — ROCURONIUM BROMIDE 100 MG/10ML IV SOLN
INTRAVENOUS | Status: DC | PRN
  Administered 2016-07-04: 50 mg via INTRAVENOUS
  Administered 2016-07-04: 20 mg via INTRAVENOUS

## 2016-07-04 MED ORDER — CITALOPRAM HYDROBROMIDE 40 MG PO TABS
40.0000 mg | ORAL_TABLET | Freq: Every day | ORAL | Status: DC
Start: 2016-07-04 — End: 2016-07-05
  Administered 2016-07-04 – 2016-07-05 (×2): 40 mg via ORAL
  Filled 2016-07-04 (×2): qty 1

## 2016-07-04 MED ORDER — FENTANYL CITRATE (PF) 100 MCG/2ML IJ SOLN
INTRAMUSCULAR | Status: DC | PRN
  Administered 2016-07-04: 75 ug via INTRAVENOUS
  Administered 2016-07-04: 50 ug via INTRAVENOUS
  Administered 2016-07-04: 25 ug via INTRAVENOUS
  Administered 2016-07-04: 50 ug via INTRAVENOUS
  Administered 2016-07-04: 25 ug via INTRAVENOUS

## 2016-07-04 MED ORDER — FENTANYL CITRATE (PF) 100 MCG/2ML IJ SOLN
INTRAMUSCULAR | Status: AC
  Filled 2016-07-04: qty 2

## 2016-07-04 MED ORDER — KCL IN DEXTROSE-NACL 20-5-0.45 MEQ/L-%-% IV SOLN: INTRAVENOUS | Status: DC

## 2016-07-04 MED ORDER — HYDROXYZINE HCL 25 MG PO TABS: 50.0000 mg | ORAL_TABLET | ORAL | Status: DC | PRN

## 2016-07-04 MED ORDER — ONDANSETRON HCL 4 MG/2ML IJ SOLN
INTRAMUSCULAR | Status: DC | PRN
  Administered 2016-07-04: 4 mg via INTRAVENOUS

## 2016-07-04 MED ORDER — ARTIFICIAL TEARS OP OINT
TOPICAL_OINTMENT | OPHTHALMIC | Status: AC
  Filled 2016-07-04: qty 3.5

## 2016-07-04 MED ORDER — MIDAZOLAM HCL 5 MG/5ML IJ SOLN
INTRAMUSCULAR | Status: DC | PRN
  Administered 2016-07-04: 2 mg via INTRAVENOUS

## 2016-07-04 MED ORDER — MIRABEGRON ER 50 MG PO TB24
50.0000 mg | ORAL_TABLET | Freq: Every evening | ORAL | Status: DC
  Administered 2016-07-04: 50 mg via ORAL
  Filled 2016-07-04 (×2): qty 1

## 2016-07-04 MED ORDER — VITAMIN E 180 MG (400 UNIT) PO CAPS
800.0000 [IU] | ORAL_CAPSULE | Freq: Every day | ORAL | Status: DC
  Administered 2016-07-04 – 2016-07-05 (×2): 800 [IU] via ORAL
  Filled 2016-07-04 (×2): qty 2

## 2016-07-04 MED ORDER — THROMBIN 20000 UNITS EX SOLR
CUTANEOUS | Status: AC
Start: 2016-07-04 — End: 2016-07-04
  Filled 2016-07-04: qty 20000

## 2016-07-04 MED ORDER — SODIUM CHLORIDE 0.9 % IR SOLN
Status: DC | PRN
  Administered 2016-07-04: 09:00:00

## 2016-07-04 MED ORDER — ONDANSETRON HCL 4 MG/2ML IJ SOLN
4.0000 mg | Freq: Once | INTRAMUSCULAR | Status: AC | PRN
  Administered 2016-07-04: 4 mg via INTRAVENOUS

## 2016-07-04 MED ORDER — ROCURONIUM BROMIDE 50 MG/5ML IV SOSY
PREFILLED_SYRINGE | INTRAVENOUS | Status: AC
  Filled 2016-07-04: qty 10

## 2016-07-04 MED ORDER — LIDOCAINE HCL (CARDIAC) 20 MG/ML IV SOLN
INTRAVENOUS | Status: DC | PRN
  Administered 2016-07-04: 50 mg via INTRAVENOUS

## 2016-07-04 MED ORDER — PROPOFOL 10 MG/ML IV BOLUS
INTRAVENOUS | Status: AC
  Filled 2016-07-04: qty 40

## 2016-07-04 MED ORDER — SODIUM CHLORIDE 0.9% FLUSH: 3.0000 mL | INTRAVENOUS | Status: DC | PRN

## 2016-07-04 MED ORDER — BACITRACIN ZINC 500 UNIT/GM EX OINT
TOPICAL_OINTMENT | CUTANEOUS | Status: DC | PRN
  Administered 2016-07-04: 1 via TOPICAL

## 2016-07-04 MED ORDER — MORPHINE SULFATE (PF) 4 MG/ML IV SOLN
4.0000 mg | INTRAVENOUS | Status: DC | PRN
  Administered 2016-07-04: 4 mg via INTRAMUSCULAR
  Filled 2016-07-04: qty 1

## 2016-07-04 MED ORDER — KETOROLAC TROMETHAMINE 30 MG/ML IJ SOLN
30.0000 mg | Freq: Four times a day (QID) | INTRAMUSCULAR | Status: DC
  Administered 2016-07-04 – 2016-07-05 (×3): 30 mg via INTRAVENOUS
  Filled 2016-07-04 (×3): qty 1

## 2016-07-04 MED ORDER — PHENOL 1.4 % MT LIQD: 1.0000 | OROMUCOSAL | Status: DC | PRN

## 2016-07-04 MED ORDER — DEXAMETHASONE SODIUM PHOSPHATE 10 MG/ML IJ SOLN
INTRAMUSCULAR | Status: AC
  Filled 2016-07-04: qty 1

## 2016-07-04 MED ORDER — LIDOCAINE-EPINEPHRINE (PF) 2 %-1:200000 IJ SOLN
INTRAMUSCULAR | Status: AC
Start: 2016-07-04 — End: 2016-07-04
  Filled 2016-07-04: qty 20

## 2016-07-04 MED ORDER — HYDROCODONE-ACETAMINOPHEN 5-325 MG PO TABS: 1.0000 | ORAL_TABLET | ORAL | Status: DC | PRN

## 2016-07-04 MED ORDER — VITAMIN D 1000 UNITS PO TABS
5000.0000 [IU] | ORAL_TABLET | Freq: Every day | ORAL | Status: DC
  Administered 2016-07-05: 5000 [IU] via ORAL
  Filled 2016-07-04: qty 5

## 2016-07-04 MED ORDER — CEFAZOLIN SODIUM-DEXTROSE 2-4 GM/100ML-% IV SOLN
2.0000 g | INTRAVENOUS | Status: AC
  Administered 2016-07-04: 2 g via INTRAVENOUS

## 2016-07-04 MED ORDER — ONDANSETRON HCL 4 MG PO TABS: 4.0000 mg | ORAL_TABLET | Freq: Four times a day (QID) | ORAL | Status: DC | PRN

## 2016-07-04 MED ORDER — SODIUM CHLORIDE 0.9% FLUSH
3.0000 mL | Freq: Two times a day (BID) | INTRAVENOUS | Status: DC
Start: 2016-07-04 — End: 2016-07-05
  Administered 2016-07-04 (×2): 3 mL via INTRAVENOUS

## 2016-07-04 MED ORDER — ALUM & MAG HYDROXIDE-SIMETH 200-200-20 MG/5ML PO SUSP: 30.0000 mL | Freq: Four times a day (QID) | ORAL | Status: DC | PRN

## 2016-07-04 MED ORDER — ACYCLOVIR 400 MG PO TABS
400.0000 mg | ORAL_TABLET | Freq: Every day | ORAL | Status: DC
  Administered 2016-07-04 – 2016-07-05 (×2): 400 mg via ORAL
  Filled 2016-07-04 (×2): qty 1

## 2016-07-04 MED ORDER — LACTATED RINGERS IV SOLN
INTRAVENOUS | Status: DC | PRN
  Administered 2016-07-04 (×2): via INTRAVENOUS

## 2016-07-04 MED ORDER — SUGAMMADEX SODIUM 200 MG/2ML IV SOLN
INTRAVENOUS | Status: AC
  Filled 2016-07-04: qty 2

## 2016-07-04 MED ORDER — OXYCODONE HCL 5 MG PO TABS: 5.0000 mg | ORAL_TABLET | Freq: Once | ORAL | Status: DC | PRN

## 2016-07-04 MED ORDER — BACITRACIN ZINC 500 UNIT/GM EX OINT
TOPICAL_OINTMENT | CUTANEOUS | Status: AC
  Filled 2016-07-04: qty 28.35

## 2016-07-04 MED ORDER — DEXAMETHASONE SODIUM PHOSPHATE 10 MG/ML IJ SOLN
INTRAMUSCULAR | Status: DC | PRN
  Administered 2016-07-04: 10 mg via INTRAVENOUS

## 2016-07-04 MED ORDER — PROPOFOL 10 MG/ML IV BOLUS
INTRAVENOUS | Status: DC | PRN
Start: 2016-07-04 — End: 2016-07-04
  Administered 2016-07-04: 170 mg via INTRAVENOUS
  Administered 2016-07-04 (×2): 30 mg via INTRAVENOUS

## 2016-07-04 MED ORDER — OXYCODONE HCL 5 MG PO TABS
5.0000 mg | ORAL_TABLET | ORAL | Status: DC | PRN
  Administered 2016-07-04 – 2016-07-05 (×5): 10 mg via ORAL
  Filled 2016-07-04 (×5): qty 2

## 2016-07-04 MED ORDER — HYDROXYZINE HCL 50 MG/ML IM SOLN: 50.0000 mg | INTRAMUSCULAR | Status: DC | PRN

## 2016-07-04 MED ORDER — PHENYLEPHRINE HCL 10 MG/ML IJ SOLN
INTRAVENOUS | Status: DC | PRN
  Administered 2016-07-04: 50 ug/min via INTRAVENOUS

## 2016-07-04 MED ORDER — KETOROLAC TROMETHAMINE 30 MG/ML IJ SOLN
30.0000 mg | Freq: Once | INTRAMUSCULAR | Status: AC
  Administered 2016-07-04: 30 mg via INTRAVENOUS

## 2016-07-04 SURGICAL SUPPLY — 67 items
ADH SKN CLS APL DERMABOND .7 (GAUZE/BANDAGES/DRESSINGS) ×1
BAG DECANTER FOR FLEXI CONT (MISCELLANEOUS) ×2 IMPLANT
BIT DRILL NEURO 2X3.1 SFT TUCH (MISCELLANEOUS) ×1 IMPLANT
BIT DRILL WIRE PASS 1.3MM (BIT) ×1 IMPLANT
BLADE CLIPPER SURG (BLADE) ×2 IMPLANT
BLOCKER OASYS (Neuro Prosthesis/Implant) ×4 IMPLANT
CANISTER SUCT 3000ML PPV (MISCELLANEOUS) ×2 IMPLANT
CARTRIDGE OIL MAESTRO DRILL (MISCELLANEOUS) ×1 IMPLANT
COVER BACK TABLE 60X90IN (DRAPES) ×2 IMPLANT
DERMABOND ADVANCED (GAUZE/BANDAGES/DRESSINGS) ×1
DERMABOND ADVANCED .7 DNX12 (GAUZE/BANDAGES/DRESSINGS) ×1 IMPLANT
DIFFUSER DRILL AIR PNEUMATIC (MISCELLANEOUS) ×2 IMPLANT
DRAPE C-ARM 42X72 X-RAY (DRAPES) ×4 IMPLANT
DRAPE LAPAROTOMY 100X72 PEDS (DRAPES) ×2 IMPLANT
DRAPE POUCH INSTRU U-SHP 10X18 (DRAPES) ×2 IMPLANT
DRILL NEURO 2X3.1 SOFT TOUCH (MISCELLANEOUS) ×2
DRILL OASYS 2.5MM (BIT) IMPLANT
DRILL WIRE PASS 1.3MM (BIT) ×2
DRIUS OASYS 2.5MM (BIT) ×2
ELECT REM PT RETURN 9FT ADLT (ELECTROSURGICAL) ×2
ELECTRODE REM PT RTRN 9FT ADLT (ELECTROSURGICAL) ×1 IMPLANT
EVACUATOR 1/8 PVC DRAIN (DRAIN) IMPLANT
GAUZE SPONGE 4X4 12PLY STRL (GAUZE/BANDAGES/DRESSINGS) IMPLANT
GAUZE SPONGE 4X4 16PLY XRAY LF (GAUZE/BANDAGES/DRESSINGS) ×1 IMPLANT
GLOVE BIOGEL PI IND STRL 7.0 (GLOVE) IMPLANT
GLOVE BIOGEL PI IND STRL 7.5 (GLOVE) IMPLANT
GLOVE BIOGEL PI IND STRL 8 (GLOVE) ×1 IMPLANT
GLOVE BIOGEL PI INDICATOR 7.0 (GLOVE) ×3
GLOVE BIOGEL PI INDICATOR 7.5 (GLOVE) ×2
GLOVE BIOGEL PI INDICATOR 8 (GLOVE) ×1
GLOVE ECLIPSE 7.5 STRL STRAW (GLOVE) ×2 IMPLANT
GLOVE EXAM NITRILE LRG STRL (GLOVE) IMPLANT
GLOVE EXAM NITRILE XL STR (GLOVE) IMPLANT
GLOVE SURG SS PI 6.5 STRL IVOR (GLOVE) ×2 IMPLANT
GOWN STRL REUS W/ TWL LRG LVL3 (GOWN DISPOSABLE) IMPLANT
GOWN STRL REUS W/ TWL XL LVL3 (GOWN DISPOSABLE) ×1 IMPLANT
GOWN STRL REUS W/TWL 2XL LVL3 (GOWN DISPOSABLE) IMPLANT
GOWN STRL REUS W/TWL LRG LVL3 (GOWN DISPOSABLE) ×2
GOWN STRL REUS W/TWL XL LVL3 (GOWN DISPOSABLE) ×4
KIT BASIN OR (CUSTOM PROCEDURE TRAY) ×2 IMPLANT
KIT INFUSE SMALL (Orthopedic Implant) ×1 IMPLANT
KIT ROOM TURNOVER OR (KITS) ×2 IMPLANT
NDL SPNL 18GX3.5 QUINCKE PK (NEEDLE) ×1 IMPLANT
NDL SPNL 22GX3.5 QUINCKE BK (NEEDLE) ×2 IMPLANT
NEEDLE SPNL 18GX3.5 QUINCKE PK (NEEDLE) ×2 IMPLANT
NEEDLE SPNL 22GX3.5 QUINCKE BK (NEEDLE) ×4 IMPLANT
NS IRRIG 1000ML POUR BTL (IV SOLUTION) ×2 IMPLANT
OIL CARTRIDGE MAESTRO DRILL (MISCELLANEOUS) ×2
PACK LAMINECTOMY NEURO (CUSTOM PROCEDURE TRAY) ×2 IMPLANT
PACK VITOSS BIOACTIVE 2.5CC (Neuro Prosthesis/Implant) ×1 IMPLANT
PAD ARMBOARD 7.5X6 YLW CONV (MISCELLANEOUS) ×6 IMPLANT
PIN MAYFIELD SKULL DISP (PIN) ×2 IMPLANT
ROD OASYS 3.5X25MM (Rod) ×2 IMPLANT
SCREW BIASED ANGLE 3.5X14 (Screw) ×4 IMPLANT
SPONGE GAUZE 4X4 12PLY STER LF (GAUZE/BANDAGES/DRESSINGS) ×1 IMPLANT
SPONGE LAP 4X18 X RAY DECT (DISPOSABLE) IMPLANT
SPONGE SURGIFOAM ABS GEL 100 (HEMOSTASIS) ×2 IMPLANT
STAPLER SKIN PROX WIDE 3.9 (STAPLE) ×2 IMPLANT
SUT ETHILON 3 0 FSL (SUTURE) ×1 IMPLANT
SUT VIC AB 0 CT1 18XCR BRD8 (SUTURE) ×1 IMPLANT
SUT VIC AB 0 CT1 8-18 (SUTURE) ×2
SUT VIC AB 2-0 CP2 18 (SUTURE) ×2 IMPLANT
TAP 3.5MM (TAP) ×1 IMPLANT
TAPE CLOTH SURG 4X10 WHT LF (GAUZE/BANDAGES/DRESSINGS) ×1 IMPLANT
TOWEL OR 17X24 6PK STRL BLUE (TOWEL DISPOSABLE) ×2 IMPLANT
TOWEL OR 17X26 10 PK STRL BLUE (TOWEL DISPOSABLE) ×2 IMPLANT
WATER STERILE IRR 1000ML POUR (IV SOLUTION) ×2 IMPLANT

## 2016-07-04 NOTE — Anesthesia Preprocedure Evaluation (Addendum)
Anesthesia Evaluation  Patient identified by MRN, date of birth, ID band Patient awake    Reviewed: Allergy & Precautions, NPO status , Patient's Chart, lab work & pertinent test results  Airway Mallampati: II  TM Distance: >3 FB   Mouth opening: Limited Mouth Opening  Dental  (+) Teeth Intact, Dental Advisory Given   Pulmonary asthma , sleep apnea (does not wear CPAP as prescribed) and Continuous Positive Airway Pressure Ventilation ,    breath sounds clear to auscultation       Cardiovascular hypertension,  Rhythm:Regular Rate:Normal     Neuro/Psych    GI/Hepatic   Endo/Other    Renal/GU      Musculoskeletal   Abdominal (+) + obese,   Peds  Hematology   Anesthesia Other Findings Patient is Jehova's witness.  Ok to receive cellsaver and albumin.  Refuses all other blood products.  Reproductive/Obstetrics                           Anesthesia Physical Anesthesia Plan  ASA: II  Anesthesia Plan: General   Post-op Pain Management:    Induction: Intravenous  Airway Management Planned: Oral ETT  Additional Equipment:   Intra-op Plan:   Post-operative Plan: Extubation in OR  Informed Consent: I have reviewed the patients History and Physical, chart, labs and discussed the procedure including the risks, benefits and alternatives for the proposed anesthesia with the patient or authorized representative who has indicated his/her understanding and acceptance.   Dental advisory given  Plan Discussed with: CRNA and Anesthesiologist  Anesthesia Plan Comments: (Asthma well controlled S/P ACDF limited neck mobility  Plan GA with glide scope intubation.)        Anesthesia Quick Evaluation

## 2016-07-04 NOTE — H&P (Signed)
Subjective: Patient is a 45 y.o. white female who is admitted for treatment of nonunion and pseudoarthrosis. Patient is 11 months status post a C3-4 ACDF. Patient's had continued neck pain and CT and x-rays show evidence of nonunion and pseudoarthrosis at the C3-4 level. Patient is admitted now for a C3-4 posterior cervical arthrodesis with lateral mass screws, rods, and bone graft.   Patient Active Problem List   Diagnosis Date Noted  . Fibromyalgia 07/07/2015  . Obesity, unspecified 05/09/2013  . Bilateral renal cysts 05/09/2013  . Dyspareunia 02/25/2013  . Hyperglycemia 11/07/2012  . Other malaise and fatigue 11/07/2012  . ANXIETY 04/22/2010  . GERD 04/22/2010  . ILEUS 04/22/2010  . IRRITABLE BOWEL SYNDROME 04/22/2010  . Sleep apnea 04/22/2010  . LIVER FUNCTION TESTS, ABNORMAL, HX OF 04/22/2010  . Hyperlipidemia 12/03/2009  . HYPERTENSION 12/03/2009  . FATTY LIVER DISEASE 12/03/2009  . CHEST PAIN UNSPECIFIED 12/03/2009   Past Medical History:  Diagnosis Date  . Allergy   . Anemia    hx  . Anxiety    takes Klonopin daily as needed.takes Celexa daily  . Arthritis    left foot and hands  . Asthma    exercise induced  . Complication of anesthesia    slow to wake  up  . High cholesterol    takes Pravastatin daily  . History of bronchitis    several yrs ago  . History of migraine    last one about 2 wks ago  . Hypertension   . IBS (irritable bowel syndrome)    takes Trulance and ProBiotic Daily  . Inflammation of hair follicles    takes Minocycline daily  . Insomnia    takes Melatonin nightly  . 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 Migraine   . Sleep apnea    sleep study > 5 yrs ago. Uses CPAP  . Spastic bladder    takes Myrbetriq daily  . Tingling    r/t neck. Pseudoarthrosis   . UTI (urinary tract infection)    hx of but takes Macrodantin daily. has been on for 2 1/2 yrs     Past Surgical History:  Procedure Laterality Date  . ABDOMINAL HYSTERECTOMY  2005  . BREAST  REDUCTION SURGERY  2009  . CERVICAL DISC SURGERY  2007, 2009, 2014, 2017  . CESAREAN SECTION  94/03   x 2  . COLONOSCOPY    . ESOPHAGOGASTRODUODENOSCOPY    . POSTERIOR CERVICAL FUSION/FORAMINOTOMY N/A 01/07/2013   Procedure: POSTERIOR CERVICAL FUSION/FORAMINOTOMY LEVEL 2;  Surgeon: Hosie Spangle, MD;  Location: [facility];  Service: Neurosurgery;  Laterality: N/A;  Posterior Cervical Five-Six/Six-Seven Arthrodesis with Instrumentation  . tummy tuck      Facility-Administered Medications Prior to Admission  Medication Dose Route Frequency Provider Last Rate Last Dose  . 0.9 %  sodium chloride infusion  500 mL Intravenous Continuous Jerene Bears, MD       Prescriptions Prior to Admission  Medication Sig Dispense Refill Last Dose  . acyclovir (ZOVIRAX) 400 MG tablet TAKE 1 TABLET EVERY DAY 90 tablet 1 07/03/2016 at Unknown time  . AMBULATORY NON FORMULARY MEDICATION Syntrinol Take 2 tablets by mouth once daily   06/27/16  . AMBULATORY NON FORMULARY MEDICATION Take 2 tablets by mouth daily. Liver Restorer   06/27/16  . AMBULATORY NON FORMULARY MEDICATION Methylation Take 1 tablet by mouth daily   06/27/16  . ASHWAGANDHA PO Take 2 tablets by mouth daily.   06/27/16  . Barberry-Oreg Grape-Goldenseal (BERBERINE COMPLEX  PO) Take 2 tablets by mouth daily.   06/27/16  . Cholecalciferol (VITAMIN D3) 5000 units CAPS Take 5,000 Units by mouth daily.    06/27/16  . citalopram (CELEXA) 40 MG tablet TAKE 1 TABLET EVERY DAY 90 tablet 3 07/03/2016 at Unknown time  . clonazePAM (KLONOPIN) 1 MG tablet TAKE 1/2-1 TABLET BY MOUTH TWICE A DAY AS NEEDED (Patient taking differently: TAKE 1  TABLET DAILY) 60 tablet 4 07/03/2016 at Unknown time  . Coenzyme Q10 (CO Q10 PO) Take 1 tablet by mouth daily.   06/27/16  . DANDELION PO Take 2 tablets by mouth daily.   06/27/16  . Estradiol (DIVIGEL) 1 MG/GM GEL PLACE 1 PACKET ONTO THE SKIN DAILY. (Patient taking differently: Apply 2 packets topically daily. ) 90 g 3 06/27/16   . estradiol (ESTRACE) 1 MG tablet Take 1 mg by mouth daily.     . Flaxseed, Linseed, (FLAX PO) Take 1 tablet by mouth daily.   06/27/16  . ibuprofen (ADVIL,MOTRIN) 800 MG tablet Take 800 mg by mouth every 8 (eight) hours as needed. for pain  3 06/26/16  . KRILL OIL PO Take 1 tablet by mouth daily.   06/27/16  . Melatonin 5 MG TABS Take 5 mg by mouth at bedtime as needed for sleep.   06/27/16  . Menaquinone-7 (VITAMIN K2 PO) Take 1 tablet by mouth daily.   06/27/16  . Minocycline HCl (SOLODYN) 115 MG TB24 Take 115 mg by mouth every evening.   07/03/2016 at Unknown time  . Multiple Vitamin (MULTIVITAMIN) tablet Take 2 tablets by mouth daily.   06/27/16  . MYRBETRIQ 50 MG TB24 tablet TAKE 1 TABLET BY MOUTH EVERY DAY (Patient taking differently: TAKE 1 TABLET BY MOUTH EVERY EVENING) 90 tablet 3 07/03/2016 at Unknown time  . NIACIN CR PO Take 2 tablets by mouth every evening.    06/26/16  . nitrofurantoin (MACRODANTIN) 100 MG capsule Take 100 mg by mouth at bedtime.    06/27/16  . Nutritional Supplements (DHEA PO) Take 1 tablet by mouth daily.   06/27/16  . Nutritional Supplements (DHEA PO) Take 1 tablet by mouth daily.   06/27/16  . Omega-3 Fatty Acids (OMEGA-3 FISH OIL PO) Take 1,400 mg by mouth 2 (two) times daily.    06/27/16  . OVER THE COUNTER MEDICATION Take 1 tablet by mouth daily. Methylation   06/27/16  . OVER THE COUNTER MEDICATION Take 1 tablet by mouth daily. Pregnenolone   06/27/16  . Plecanatide (TRULANCE) 3 MG TABS Take 1 tablet by mouth daily. 90 tablet 1 07/03/2016 at Unknown time  . pravastatin (PRAVACHOL) 20 MG tablet TAKE 1 TABLET (20 MG TOTAL) BY MOUTH EVERY EVENING. (Patient taking differently: TAKE 0.5 TABLET (10 MG TOTAL) BY MOUTH EVERY EVENING.) 90 tablet 0 06/26/16  . Probiotic Product (PROBIOTIC DAILY PO) Take 2 tablets by mouth daily.   06/27/16  . RESVERATROL PO Take 1 tablet by mouth daily.   06/27/16  . thyroid (ARMOUR) 120 MG tablet Take 120 mg by mouth daily before breakfast.      . thyroid (ARMOUR) 30 MG tablet Take 30 mg by mouth daily before breakfast.     . traMADol (ULTRAM) 50 MG tablet Take 50-100 mg by mouth 2 (two) times daily as needed for moderate pain. DEPENDS ON PAIN IF TAKES 50-100 MG  0 Past Week at Unknown time  . TURMERIC PO Take 1 tablet by mouth daily.   06/27/16  . vitamin E (VITAMIN E) 400 UNIT  capsule Take 2 capsules (800 Units total) by mouth daily. 180 capsule 1 06/27/16  . Albuterol Sulfate (PROAIR RESPICLICK IN) Inhale 2 puffs into the lungs as needed (SHORTNESS NOF BREATH).   Unknown at Unknown time  . clindamycin (CLEOCIN T) 1 % lotion Apply 1 application topically 2 (two) times daily as needed (ACNE BREAKOUTS).   2 More than a month at Unknown time  . [facility] VITAMIN D3 1000 units capsule TAKE 2 CAPSULES EVERY DAY (Patient not taking: Reported on 06/23/2016) 180 capsule 0 Not Taking at Unknown time  . [facility] VITAMIN E 400 units capsule TAKE 2 CAPSULES BY MOUTH EVERY DAY 180 capsule 0 06/27/16  . fluticasone (FLONASE) 50 MCG/ACT nasal spray USE 2 SPRAYS IN EACH NOSTRIL EVERY DAY (Patient taking differently: Place 2 sprays into both nostrils daily as needed. ) 48 g 5 Unknown at Unknown time  . Fluticasone-Salmeterol (ADVAIR DISKUS) 250-50 MCG/DOSE AEPB Inhale 1 puff into the lungs 2 (two) times daily. (Patient not taking: Reported on 06/23/2016) 3 each 4 Not Taking at Unknown time  . montelukast (SINGULAIR) 10 MG tablet TAKE 1 TABLET (10 MG TOTAL) BY MOUTH AT BEDTIME. 90 tablet 0 Unknown at Unknown time   Allergies  Allergen Reactions  . Dairy Aid [Lactase]   . Gluten Meal   . Levaquin [Levofloxacin In D5w] Diarrhea and Nausea And Vomiting  . Topamax [Topiramate]     Causes anger  . Wheat Bran     Social History  Substance Use Topics  . Smoking status: Never Smoker  . Smokeless tobacco: Never Used  . Alcohol use 0.0 oz/week     Comment: occasional    Family History  Problem Relation Age of Onset  . Cirrhosis Father     alcohol abuse  .  Diabetes Father   . Hypertension Father   . Ovarian cancer Mother   . Hypertension Mother   . Irritable bowel syndrome Mother   . Colon cancer Maternal Grandmother   . CAD Maternal Grandfather   . Colon polyps Neg Hx   . Esophageal cancer Neg Hx   . Rectal cancer Neg Hx   . Stomach cancer Neg Hx      Review of Systems A comprehensive review of systems was negative.  Objective: Vital signs in last 24 hours: Temp:  [98.3 F (36.8 C)] 98.3 F (36.8 C) (12/11 0556) Pulse Rate:  [97] 97 (12/11 0556) Resp:  [20] 20 (12/11 0556) BP: (148)/(85) 148/85 (12/11 0556) SpO2:  [92 %] 92 % (12/11 0556) Weight:  [89.4 kg (197 lb)] 89.4 kg (197 lb) (12/11 0556)  EXAM: Patient well-developed well-nourished white female in no acute distress. Lungs are clear to auscultation , the patient has symmetrical respiratory excursion. Heart has a regular rate and rhythm normal S1 and S2 no murmur.   Abdomen is soft nontender nondistended bowel sounds are present. Extremity examination shows no clubbing cyanosis or edema. Motor examination shows 5 over 5 strength in the upper extremities including the deltoid biceps triceps and intrinsics and grip. Sensation is intact to pinprick throughout the digits of the upper extremities. Reflexes are symmetrical and without evidence of pathologic reflexes. Patient has a normal gait and stance.   Data Review:CBC    Component Value Date/Time   WBC 9.0 06/27/2016 0922   RBC 4.55 06/27/2016 0922   HGB 13.7 06/27/2016 0922   HCT 42.1 06/27/2016 0922   PLT 341 06/27/2016 0922   MCV 92.5 06/27/2016 0922   MCH 30.1 06/27/2016 0922  MCHC 32.5 06/27/2016 0922   RDW 13.4 06/27/2016 0922   LYMPHSABS 3.6 03/30/2016 0846   MONOABS 0.7 03/30/2016 0846   EOSABS 0.2 03/30/2016 0846   BASOSABS 0.0 03/30/2016 0846                          BMET    Component Value Date/Time   NA 138 06/27/2016 1145   K 3.8 06/27/2016 1145   CL 105 06/27/2016 1145   CO2 23 06/27/2016 1145    GLUCOSE 84 06/27/2016 1145   BUN 13 06/27/2016 1145   CREATININE 0.93 06/27/2016 1145   CREATININE 0.82 01/15/2015 2351   CALCIUM 9.5 06/27/2016 1145   GFRNONAA >60 06/27/2016 1145   GFRAA >60 06/27/2016 1145     Assessment/Plan: 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  I've discussed with the patient the nature of his condition, the nature the surgical procedure, the typical length of surgery, hospital stay, and overall recuperation. We discussed limitations postoperatively. I discussed risks of surgery including risks of infection, bleeding, possibly need for transfusion, the risk of nerve root dysfunction with pain, weakness, numbness, or paresthesias, the risk of spinal cord dysfunction with paralysis of all 4 limbs and quadriplegia, and the risk of dural tear and CSF leakage and possible need for further surgery, the risk of failure of the arthrodesis and the possible need for further surgery, and the risk of anesthetic complications including myocardial infarction, stroke, pneumonia, and death. We also discussed the need for postoperative immobilization in a cervical collar. Understanding all this the patient does wish to proceed with surgery and is admitted for such.    Hosie Spangle, MD 07/04/2016 7:15 AM

## 2016-07-04 NOTE — Progress Notes (Signed)
Vitals:   07/04/16 1130 07/04/16 1145 07/04/16 1207 07/04/16 1616  BP: (!) 141/87 (!) 141/87 (!) 141/94 135/85  Pulse: 93 100 92 79  Resp: 14 20 18 18   Temp:  98 F (36.7 C) 98 F (36.7 C) 97.8 F (36.6 C)  TempSrc:      SpO2: 94% 95% 94% 92%  Weight:        Patient resting in bed, moderate incisional pain. Has ambulated in the halls with the staff. Dressing clean and dry. Voiding well.  Plan: Encouraged to ambulate again this evening and in a.m. We'll continue to progress through postoperative recovery.  Hosie Spangle, MD 07/04/2016, 6:07 PM

## 2016-07-04 NOTE — Op Note (Signed)
07/04/2016  9:51 AM  PATIENT:  [name]  45 y.o. 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  Radiolucent 3 pin Mayfield head holderwas applied, and the patient was turned to a prone position. The posterior neck, occiput, and upper back were prepped with Betadine soap and solution and draped in a sterile fashion. The midline was infiltrated with local anesthetic with epinephrine.  The C-arm fluoroscope was used throughout the procedure to guide localization and screw placement. The C3-4 level was identified.  A midline incision was made over the C3-4 level, and carried down through the subcutaneous tissue. Bipolar cautery and electrocautery used to maintain hemostasis.  Posterior cervical fascia was incised bilaterally, and the paracervical musculature was dissected from the spinous processes and lamina in a subperiosteal fashion. Self-retaining retractor was placed. Using the C-arm fluoroscope we identified the lateral masses of C3 and C4. Pilot holes were made using the high-speed drill with the wire passer bur. Each screw hole was then drilled using the hand drill and a drill guide set to a depth of 14 mm. Each screw hole was examined with the ball probe. Good bony surfaces were noted. C-arm fluoroscopy confirmed good screw hole positioning, and the posterior cortex of each screw hole was tapped, and a  3.5 x 14 mm Oasys screw placed. Once all 4 screws were placed, we  decorticated the facet joint surfaces and laminar surfaces with the high-speed drill. A 25 mm rods were placed within the screw heads on each side, and locking caps were placed. Once all 4 locking caps were placed, final tightening was performed against a counter torque. We then packed a combination of infuse and Vitoss BA into the C3-4 facet joints bilaterally, as well as over the C3 and C4 laminar surfaces. We then proceeded with closure. Deep fascia was closed with interrupted undyed 0 Vicryl sutures. The subcutaneous and subcuticular closed with interrupted inverted 2-0 undyed Vicryl sutures.  Skin edges were closed with Dermabond. The wound was dressed with sterile gauze and Hypafix.  Following surgery the patient was turned back to a supine position, the 3 pin Mayfield head holder was removed, and the patient is to be reversed and the anesthetic, extubated, and transferred to the recovery room for further care.   PLAN OF CARE: Admit for overnight observation  PATIENT DISPOSITION:  PACU - hemodynamically stable.   Delay start of Pharmacological VTE agent (>24hrs) due to surgical blood loss or risk of bleeding:  yes

## 2016-07-04 NOTE — Anesthesia Procedure Notes (Signed)
Procedure Name: Intubation Date/Time: 07/04/2016 7:47 AM Performed by: Suzy Bouchard Pre-anesthesia Checklist: Patient identified, Emergency Drugs available, Suction available, Timeout performed and Patient being monitored Patient Re-evaluated:Patient Re-evaluated prior to inductionOxygen Delivery Method: Circle system utilized Preoxygenation: Pre-oxygenation with 100% oxygen Intubation Type: IV induction Ventilation: Mask ventilation without difficulty and Oral airway inserted - appropriate to patient size Laryngoscope Size: Glidescope and 3 Grade View: Grade I Tube type: Oral Laser Tube: Cuffed inflated with minimal occlusive pressure - saline Tube size: 7.0 mm Number of attempts: 1 Airway Equipment and Method: Stylet and Video-laryngoscopy Placement Confirmation: ETT inserted through vocal cords under direct vision,  positive ETCO2 and breath sounds checked- equal and bilateral Secured at: 23 cm Tube secured with: Tape Dental Injury: Teeth and Oropharynx as per pre-operative assessment  Difficulty Due To: Difficult Airway- due to reduced neck mobility

## 2016-07-04 NOTE — Transfer of Care (Signed)
Immediate Anesthesia Transfer of Care Note  Patient: [name]  Procedure(s) Performed: Procedure(s) with comments: CERVICAL THREE-FOUR POSTERIOR CERVICAL ARTHRODESIS (N/A) - C3-C4 POSTERIOR CERVICAL ARTHRODESIS  Patient Location: PACU  Anesthesia Type:General  Level of Consciousness: awake, alert  and oriented  Airway & Oxygen Therapy: Patient Spontanous Breathing and Patient connected to nasal cannula oxygen  Post-op Assessment: Report given to RN, Post -op Vital signs reviewed and stable and Patient moving all extremities X 4  Post vital signs: Reviewed and stable  Last Vitals:  Vitals:   07/04/16 0556 07/04/16 1004  BP: (!) 148/85 (!) (P) 147/102  Pulse: 97   Resp: 20   Temp: 36.8 C (P) 36.7 C    Last Pain:  Vitals:   07/04/16 4715  TempSrc:   PainSc: 7          Complications: No apparent anesthesia complications

## 2016-07-04 NOTE — Anesthesia Postprocedure Evaluation (Signed)
Anesthesia Post Note  Patient: [name]  Procedure(s) Performed: Procedure(s) (LRB): CERVICAL THREE-FOUR POSTERIOR CERVICAL ARTHRODESIS (N/A)  Patient location during evaluation: PACU Anesthesia Type: General Level of consciousness: awake and oriented Pain management: pain level controlled Vital Signs Assessment: post-procedure vital signs reviewed and stable Respiratory status: spontaneous breathing, nonlabored ventilation, respiratory function stable and patient connected to nasal cannula oxygen Cardiovascular status: blood pressure returned to baseline    Last Vitals:  Vitals:   07/04/16 1145 07/04/16 1207  BP: (!) 141/87 (!) 141/94  Pulse: 100 92  Resp: 20 18  Temp: 36.7 C 36.7 C    Last Pain:  Vitals:   07/04/16 1145  TempSrc:   PainSc: Asleep                 Zamarah Ullmer COKER

## 2016-07-05 ENCOUNTER — Other Ambulatory Visit: Payer: Self-pay | Admitting: Family Medicine

## 2016-07-05 ENCOUNTER — Other Ambulatory Visit: Payer: Self-pay | Admitting: Internal Medicine

## 2016-07-05 ENCOUNTER — Encounter (HOSPITAL_COMMUNITY): Payer: Self-pay | Admitting: Neurosurgery

## 2016-07-05 DIAGNOSIS — M960 Pseudarthrosis after fusion or arthrodesis: Secondary | ICD-10-CM | POA: Diagnosis not present

## 2016-07-05 MED ORDER — OXYCODONE HCL 5 MG PO TABS: 5.0000 mg | ORAL_TABLET | ORAL | 0 refills | Status: DC | PRN

## 2016-07-05 MED ORDER — CYCLOBENZAPRINE HCL 10 MG PO TABS: 5.0000 mg | ORAL_TABLET | Freq: Three times a day (TID) | ORAL | 0 refills | Status: AC | PRN

## 2016-07-05 NOTE — Discharge Instructions (Signed)

## 2016-07-05 NOTE — Progress Notes (Signed)
Patient alert and oriented, mae's well, voiding adequate amount of urine, swallowing without difficulty, c/o mild pain at time of discharge. Patient discharged home with family. Script and discharged instructions given to patient. Patient and family stated understanding of instructions given. Patient has an appointment with Dr. Sherwood Gambler.

## 2016-07-05 NOTE — Discharge Summary (Signed)
Physician Discharge Summary  Patient ID: [name] MRN: [id number] DOB/AGE: Nov 19, 1970 45 y.o.  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 Postoperatively she is done well. She is up and ambulate actively. Her dressing was removed, and her incision is healing nicely. There is no swelling, erythema, or drainage. She is being discharged to home with instructions regarding wound care and activities. She is scheduled to follow-up with me in the office in about 3 weeks with x-rays.  Discharge Exam: Blood pressure 107/71, pulse 75, temperature 98.3 F (36.8 C), temperature source Oral, resp. rate 18, weight 89.4 kg (197 lb), SpO2 96 %.  Disposition: 01-Home or Self Care     Medication List    STOP taking these medications   Fluticasone-Salmeterol 250-50 MCG/DOSE Aepb Commonly known as:  ADVAIR DISKUS     TAKE these medications   acyclovir 400 MG tablet Commonly known as:  ZOVIRAX TAKE 1 TABLET EVERY DAY   AMBULATORY NON FORMULARY MEDICATION Syntrinol Take 2 tablets by mouth once daily   AMBULATORY NON FORMULARY MEDICATION Take 2 tablets by mouth daily. Liver Restorer   AMBULATORY NON FORMULARY MEDICATION Methylation Take 1 tablet by mouth daily   ASHWAGANDHA PO Take 2 tablets by mouth daily.   BERBERINE COMPLEX PO Take 2 tablets by mouth daily.   citalopram 40 MG tablet Commonly known as:  CELEXA TAKE 1 TABLET EVERY DAY   clindamycin 1 % lotion Commonly known as:  CLEOCIN T Apply 1 application topically 2 (two) times daily as needed (ACNE BREAKOUTS).   clonazePAM 1 MG tablet Commonly known as:  KLONOPIN TAKE 1/2-1 TABLET BY MOUTH TWICE A DAY AS  NEEDED What changed:  See the new instructions.   CO Q10 PO Take 1 tablet by mouth daily.   cyclobenzaprine 10 MG tablet Commonly known as:  FLEXERIL Take 0.5-1 tablets (5-10 mg total) by mouth 3 (three) times daily as needed for muscle spasms.   DANDELION PO Take 2 tablets by mouth daily.   DHEA PO Take 1 tablet by mouth daily.   DHEA PO Take 1 tablet by mouth daily.   estradiol 1 MG tablet Commonly known as:  ESTRACE Take 1 mg by mouth daily. What changed:  Another medication with the same name was changed. Make sure you understand how and when to take each.   Estradiol 1 MG/GM Gel Commonly known as:  DIVIGEL PLACE 1 PACKET ONTO THE SKIN DAILY. What changed:  how much to take  how to take this  when to take this  additional instructions   FLAX PO Take 1 tablet by mouth daily.   fluticasone 50 MCG/ACT nasal spray Commonly known as:  FLONASE USE 2 SPRAYS IN EACH NOSTRIL EVERY DAY What changed:  how much to take  how to take this  when to take this  reasons to take this  additional instructions   ibuprofen 800 MG tablet Commonly known as:  ADVIL,MOTRIN Take 800 mg by mouth every 8 (eight) hours as needed. for pain   KRILL OIL PO Take 1 tablet by mouth daily.   Melatonin 5 MG Tabs Take 5 mg by mouth at bedtime as needed for sleep.   montelukast 10 MG tablet Commonly known as:  SINGULAIR TAKE 1 TABLET (10 MG TOTAL) BY  MOUTH AT BEDTIME.   multivitamin tablet Take 2 tablets by mouth daily.   MYRBETRIQ 50 MG Tb24 tablet Generic drug:  mirabegron ER TAKE 1 TABLET BY MOUTH EVERY DAY What changed:  See the new instructions.   NIACIN CR PO Take 2 tablets by mouth every evening.   nitrofurantoin 100 MG capsule Commonly known as:  MACRODANTIN Take 100 mg by mouth at bedtime.   OMEGA-3 FISH OIL PO Take 1,400 mg by mouth 2 (two) times daily.   OVER THE COUNTER MEDICATION Take 1 tablet by mouth daily. Methylation   OVER THE COUNTER  MEDICATION Take 1 tablet by mouth daily. Pregnenolone   oxyCODONE 5 MG immediate release tablet Commonly known as:  Oxy IR/ROXICODONE Take 1-2 tablets (5-10 mg total) by mouth every 4 (four) hours as needed (pain).   Plecanatide 3 MG Tabs Commonly known as:  TRULANCE Take 1 tablet by mouth daily.   pravastatin 20 MG tablet Commonly known as:  PRAVACHOL TAKE 1 TABLET (20 MG TOTAL) BY MOUTH EVERY EVENING. What changed:  See the new instructions.   PROAIR RESPICLICK IN Inhale 2 puffs into the lungs as needed (SHORTNESS NOF BREATH).   PROBIOTIC DAILY PO Take 2 tablets by mouth daily.   RESVERATROL PO Take 1 tablet by mouth daily.   SOLODYN 115 MG Tb24 Generic drug:  Minocycline HCl Take 115 mg by mouth every evening.   thyroid 120 MG tablet Commonly known as:  ARMOUR Take 120 mg by mouth daily before breakfast.   thyroid 30 MG tablet Commonly known as:  ARMOUR Take 30 mg by mouth daily before breakfast.   traMADol 50 MG tablet Commonly known as:  ULTRAM Take 50-100 mg by mouth 2 (two) times daily as needed for moderate pain. DEPENDS ON PAIN IF TAKES 50-100 MG   TURMERIC PO Take 1 tablet by mouth daily.   Vitamin D3 5000 units Caps Take 5,000 Units by mouth daily. What changed:  Another medication with the same name was removed. Continue taking this medication, and follow the directions you see here.   vitamin E 400 UNIT capsule Commonly known as:  vitamin E Take 2 capsules (800 Units total) by mouth daily.   [facility] VITAMIN E 400 UNIT capsule Generic drug:  vitamin E TAKE 2 CAPSULES BY MOUTH EVERY DAY   VITAMIN K2 PO Take 1 tablet by mouth daily.        SignedHosie Spangle 07/05/2016, 8:35 AM

## 2016-07-05 NOTE — Telephone Encounter (Signed)
So her med list says 5000 units in the request for this prescription is 1000 units? I believe it is important to clarify with the patient which one in fact is she taking? Unfortunately her med list is a running list of supplementations

## 2016-07-06 NOTE — Telephone Encounter (Signed)
Patient is not sure exactly how much she is taking. She will call us back with the exact dosage when she is able to check her medications.

## 2016-07-22 ENCOUNTER — Other Ambulatory Visit: Payer: Self-pay | Admitting: Family Medicine

## 2016-07-27 ENCOUNTER — Ambulatory Visit (HOSPITAL_COMMUNITY): Payer: BLUE CROSS/BLUE SHIELD

## 2016-07-27 ENCOUNTER — Encounter: Payer: Self-pay | Admitting: *Deleted

## 2016-08-08 ENCOUNTER — Ambulatory Visit (HOSPITAL_COMMUNITY)
Admission: RE | Admit: 2016-08-08 | Discharge: 2016-08-08 | Disposition: A | Discharge status: Home / Self Care | Payer: BLUE CROSS/BLUE SHIELD | Source: Ambulatory Visit | Attending: Internal Medicine | Admitting: Internal Medicine

## 2016-08-08 DIAGNOSIS — K7689 Other specified diseases of liver: Secondary | ICD-10-CM | POA: Insufficient documentation

## 2016-08-08 DIAGNOSIS — K7581 Nonalcoholic steatohepatitis (NASH): Secondary | ICD-10-CM | POA: Insufficient documentation

## 2016-08-11 ENCOUNTER — Other Ambulatory Visit: Payer: BLUE CROSS/BLUE SHIELD | Admitting: Obstetrics & Gynecology

## 2016-08-16 ENCOUNTER — Ambulatory Visit: Payer: BLUE CROSS/BLUE SHIELD | Admitting: Internal Medicine

## 2016-08-20 ENCOUNTER — Other Ambulatory Visit: Payer: Self-pay | Admitting: Internal Medicine

## 2016-08-25 ENCOUNTER — Encounter: Payer: Self-pay | Admitting: Internal Medicine

## 2016-08-25 ENCOUNTER — Other Ambulatory Visit (INDEPENDENT_AMBULATORY_CARE_PROVIDER_SITE_OTHER): Payer: BLUE CROSS/BLUE SHIELD

## 2016-08-25 ENCOUNTER — Ambulatory Visit (INDEPENDENT_AMBULATORY_CARE_PROVIDER_SITE_OTHER): Payer: BLUE CROSS/BLUE SHIELD | Admitting: Internal Medicine

## 2016-08-25 ENCOUNTER — Ambulatory Visit: Payer: BLUE CROSS/BLUE SHIELD | Admitting: Internal Medicine

## 2016-08-25 VITALS — BP 128/86 | HR 100 | Ht 64.5 in | Wt 202.1 lb

## 2016-08-25 DIAGNOSIS — K582 Mixed irritable bowel syndrome: Secondary | ICD-10-CM

## 2016-08-25 DIAGNOSIS — K7581 Nonalcoholic steatohepatitis (NASH): Secondary | ICD-10-CM

## 2016-08-25 DIAGNOSIS — K7400 Hepatic fibrosis, unspecified: Secondary | ICD-10-CM

## 2016-08-25 LAB — CBC WITH DIFFERENTIAL/PLATELET
BASOS ABS: 0 10*3/uL (ref 0.0–0.1)
Basophils Relative: 0.4 % (ref 0.0–3.0)
EOS PCT: 1.3 % (ref 0.0–5.0)
Eosinophils Absolute: 0.1 10*3/uL (ref 0.0–0.7)
HCT: 39.8 % (ref 36.0–46.0)
HEMOGLOBIN: 13.6 g/dL (ref 12.0–15.0)
Lymphocytes Relative: 27.2 % (ref 12.0–46.0)
Lymphs Abs: 3 10*3/uL (ref 0.7–4.0)
MCHC: 34.1 g/dL (ref 30.0–36.0)
MCV: 89.9 fl (ref 78.0–100.0)
MONOS PCT: 10.2 % (ref 3.0–12.0)
Monocytes Absolute: 1.1 10*3/uL — ABNORMAL HIGH (ref 0.1–1.0)
Neutro Abs: 6.6 10*3/uL (ref 1.4–7.7)
Neutrophils Relative %: 60.9 % (ref 43.0–77.0)
Platelets: 293 10*3/uL (ref 150.0–400.0)
RBC: 4.42 Mil/uL (ref 3.87–5.11)
RDW: 13.3 % (ref 11.5–15.5)
WBC: 10.9 10*3/uL — AB (ref 4.0–10.5)

## 2016-08-25 LAB — COMPREHENSIVE METABOLIC PANEL
ALBUMIN: 4.3 g/dL (ref 3.5–5.2)
ALK PHOS: 124 U/L — AB (ref 39–117)
ALT: 68 U/L — AB (ref 0–35)
AST: 69 U/L — ABNORMAL HIGH (ref 0–37)
BILIRUBIN TOTAL: 0.7 mg/dL (ref 0.2–1.2)
BUN: 15 mg/dL (ref 6–23)
CO2: 33 mEq/L — ABNORMAL HIGH (ref 19–32)
Calcium: 9.6 mg/dL (ref 8.4–10.5)
Chloride: 103 mEq/L (ref 96–112)
Creatinine, Ser: 0.82 mg/dL (ref 0.40–1.20)
GFR: 79.33 mL/min (ref >60.00)
GLUCOSE: 118 mg/dL — AB (ref 70–99)
POTASSIUM: 4.6 meq/L (ref 3.5–5.1)
SODIUM: 140 meq/L (ref 135–145)
Total Protein: 7.3 g/dL (ref 6.0–8.3)

## 2016-08-25 LAB — PROTIME-INR
INR: 1 ratio (ref 0.8–1.0)
Prothrombin Time: 10.4 s (ref 9.6–13.1)

## 2016-08-25 NOTE — Patient Instructions (Addendum)
Your physician has requested that you go to the basement for the following lab work before leaving today: CBC, CMP, INR  Continue your vitamin E 800 mg daily.  Change your trulance to every other day dosing.  Follow a low fat diet.  Get plenty of exercise.  Follow up with Dr Hilarie Fredrickson in 6 months.  You have been scheduled for liver biopsy at [facility] Radiology (1st floor of hospital) for Wednesday, 08/31/16 @ 1:00 pm. You need to arrive at 10:45 am. You will need to have a drive as you will be sedated for this procedure. You should have nothing to eat or drink after 7 am the morning of your test and should not take any of your medications the morning of the test either. Plan to be at radiology for at least 4-5 hours. If you need to reschedule for any reason, you may contact radiology at [phone].  You have been scheduled for your final Twinrix (hepatitis A/B injection) on Monday 09/19/16 @ 10:00 am.  If you are age 24 or older, your body mass index should be between 23-30. Your Body mass index is 34.16 kg/m. If this is out of the aforementioned range listed, please consider follow up with your Primary Care Provider.  If you are age 66 or younger, your body mass index should be between 19-25. Your Body mass index is 34.16 kg/m. If this is out of the aformentioned range listed, please consider follow up with your Primary Care Provider.

## 2016-08-25 NOTE — Progress Notes (Signed)
Subjective:    Patient ID: [name], female    DOB: 1971/07/14, 46 y.o.   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 She returns today with her boyfriend. She was last seen in the office on 03/15/2016.  She reports that recently she has been having loose stools which can happen from time to time for her periods been an issue for the last nearly 2 weeks. Stools are nonbloody and not melenic. Some lower abdominal crampy discomfort before bowel movement. She is using Trulance daily.  She had neck surgery to have a posterior C3-C4 fusion in December and this has limited mobility. She's had some pain after this surgery but it is improving. She is using tramadol now on a daily basis and is off oxycodone. She did have a mechanical fall on a wet step earlier this week and landed on her buttocks and has had bruising to her left arm lower back and buttocks. This is slowly improving.  She denies trouble swallowing, nausea, vomiting. No jaundice, lower extremity edema or itching.  She is continued vitamin E 800 international units daily.  She admits not being perfect with her diet recently and exercises been limited. She's gained 6 pounds since August 2017.  She drinks alcohol rarely maybe less than once per month. When she does drink she will have 1 or 2 drinks.  She had an ultrasound recently which showed nodularity, likely fatty filtration and no evidence of mass or lesion.  Review of Systems As per history of present illness, otherwise negative  Current Medications, Allergies, Past Medical History, Past Surgical History, Family History and Social History were reviewed in [employer] record.     Objective:   Physical Exam BP 128/86 (BP Location: Left Arm, Patient Position: Sitting, Cuff Size: Normal)   Pulse 100   Ht 5' 4.5" (1.638 m)    Wt 202 lb 2 oz (91.7 kg)   BMI 34.16 kg/m  Constitutional: Well-developed and well-nourished. No distress. HEENT: Normocephalic and atraumatic. Oropharynx is clear and moist. No oropharyngeal exudate. Conjunctivae are normal.  No scleral icterus. Neck: Neck supple. Trachea midline. Posterior incision clean dry, intact without surrounding erythema or induration Cardiovascular: Normal rate, regular rhythm and intact distal pulses. No M/R/G Pulmonary/chest: Effort normal and breath sounds normal. No wheezing, rales or rhonchi. Abdominal: Soft, nontender, nondistended. Bowel sounds active throughout. There are no masses palpable. No hepatosplenomegaly. Extremities: no clubbing, cyanosis, or edema Neurological: Alert and oriented to person place and time. Skin: Skin is warm and dry. No rashes noted. Psychiatric: Normal mood and affect. Behavior is normal.  CMP     Component Value Date/Time   NA 138 06/27/2016 1145   K 3.8 06/27/2016 1145   CL 105 06/27/2016 1145   CO2 23 06/27/2016 1145   GLUCOSE 84 06/27/2016 1145   BUN 13 06/27/2016 1145   CREATININE 0.93 06/27/2016 1145   CREATININE 0.82 01/15/2015 2351   CALCIUM 9.5 06/27/2016 1145   PROT 8.2 (H) 06/27/2016 1145   ALBUMIN 4.1 06/27/2016 1145   AST 49 (H) 06/27/2016 1145   ALT 46 06/27/2016 1145   ALKPHOS 104 06/27/2016 1145   BILITOT 1.2 06/27/2016 1145   GFRNONAA >60 06/27/2016 1145   GFRAA >60 06/27/2016 1145   Lab Results  Component Value Date   INR 1.1 (H) 03/15/2016   INR 0.91 06/09/2010  Assessment & Plan:  46 year old with a 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  1. NASH with advanced fibrosis -- I am not convinced that she has definitive cirrhosis though elastography suggested advanced fibrosis with F3 + some F4.  There is no convincing evidence for portal hypertension in that her platelets are normal. She did not have varices at recent  endoscopy. We spent a great ill of time today discussing NASH and the importance of a low-fat, low carbohydrate diet, at regular exercise regimen and attempt to lose weight. I recommended that she have a goal of the 10% weight loss in the next 12 months. For her this would be 20 pounds. --After discussion she inquired about liver biopsy, which we have previously discussed. She would like definitive evidence of NASH and better determination of the fibrosis level. Biopsy would provide this. After discussion of the risks, benefits and alternatives she wishes to proceed. Ultrasound-guided liver biopsy ordered --EGD negative for varices, repeat in October 2020 --Check CBC, CMP and INR --Continue daily vitamin E --I asked that she avoid alcohol completely as alcohol contribute to fatty liver and given her advanced fibrosis --I also asked that she pay particular attention to the supplements she is given through [facility] as supplements or not controlled by the FDA and can cause liver toxicity  2. IBS with constipation alternating with diarrhea -- I asked her to change Trulance to every other day to see if her loose stools improved. If not she should stop this altogether and if persistent we would do stool studies.  3. CRC screening/family history of colon cancer -- normal colonoscopy April 2017, repeat April 2022  Return to the office in 6 months, sooner necessary 25 minutes spent with the patient today. Greater than 50% was spent in counseling and coordination of care with the patient

## 2016-08-30 ENCOUNTER — Other Ambulatory Visit: Payer: Self-pay | Admitting: Radiology

## 2016-08-31 ENCOUNTER — Encounter (HOSPITAL_COMMUNITY): Payer: Self-pay

## 2016-08-31 ENCOUNTER — Ambulatory Visit (HOSPITAL_COMMUNITY)
Admission: RE | Admit: 2016-08-31 | Discharge: 2016-08-31 | Disposition: A | Discharge status: Home / Self Care | Payer: BLUE CROSS/BLUE SHIELD | Source: Ambulatory Visit | Attending: Internal Medicine | Admitting: Internal Medicine

## 2016-08-31 DIAGNOSIS — J45909 Unspecified asthma, uncomplicated: Secondary | ICD-10-CM | POA: Insufficient documentation

## 2016-08-31 DIAGNOSIS — R202 Paresthesia of skin: Secondary | ICD-10-CM | POA: Insufficient documentation

## 2016-08-31 DIAGNOSIS — M1990 Unspecified osteoarthritis, unspecified site: Secondary | ICD-10-CM | POA: Diagnosis not present

## 2016-08-31 DIAGNOSIS — K589 Irritable bowel syndrome without diarrhea: Secondary | ICD-10-CM | POA: Diagnosis not present

## 2016-08-31 DIAGNOSIS — R740 Nonspecific elevation of levels of transaminase and lactic acid dehydrogenase [LDH]: Secondary | ICD-10-CM | POA: Insufficient documentation

## 2016-08-31 DIAGNOSIS — K7581 Nonalcoholic steatohepatitis (NASH): Secondary | ICD-10-CM | POA: Diagnosis present

## 2016-08-31 DIAGNOSIS — I10 Essential (primary) hypertension: Secondary | ICD-10-CM | POA: Insufficient documentation

## 2016-08-31 DIAGNOSIS — G4700 Insomnia, unspecified: Secondary | ICD-10-CM | POA: Insufficient documentation

## 2016-08-31 DIAGNOSIS — Z87440 Personal history of urinary (tract) infections: Secondary | ICD-10-CM | POA: Insufficient documentation

## 2016-08-31 DIAGNOSIS — D649 Anemia, unspecified: Secondary | ICD-10-CM | POA: Diagnosis not present

## 2016-08-31 DIAGNOSIS — F419 Anxiety disorder, unspecified: Secondary | ICD-10-CM | POA: Insufficient documentation

## 2016-08-31 LAB — CBC WITH DIFFERENTIAL/PLATELET
BASOS ABS: 0 10*3/uL (ref 0.0–0.1)
BASOS PCT: 0 %
EOS ABS: 0.2 10*3/uL (ref 0.0–0.7)
EOS PCT: 2 %
HCT: 40 % (ref 36.0–46.0)
HEMOGLOBIN: 13.2 g/dL (ref 12.0–15.0)
LYMPHS ABS: 3 10*3/uL (ref 0.7–4.0)
Lymphocytes Relative: 37 %
MCH: 29.9 pg (ref 26.0–34.0)
MCHC: 33 g/dL (ref 30.0–36.0)
MCV: 90.7 fL (ref 78.0–100.0)
Monocytes Absolute: 0.8 10*3/uL (ref 0.1–1.0)
Monocytes Relative: 10 %
NEUTROS PCT: 51 %
Neutro Abs: 4.1 10*3/uL (ref 1.7–7.7)
PLATELETS: 304 10*3/uL (ref 150–400)
RBC: 4.41 MIL/uL (ref 3.87–5.11)
RDW: 13.8 % (ref 11.5–15.5)
WBC: 8.1 10*3/uL (ref 4.0–10.5)

## 2016-08-31 LAB — COMPREHENSIVE METABOLIC PANEL
ALK PHOS: 120 U/L (ref 38–126)
ALT: 138 U/L — AB (ref 14–54)
AST: 169 U/L — ABNORMAL HIGH (ref 15–41)
Albumin: 4.4 g/dL (ref 3.5–5.0)
Anion gap: 11 (ref 5–15)
BUN: 12 mg/dL (ref 6–20)
CALCIUM: 9.2 mg/dL (ref 8.9–10.3)
CHLORIDE: 103 mmol/L (ref 101–111)
CO2: 25 mmol/L (ref 22–32)
CREATININE: 0.79 mg/dL (ref 0.44–1.00)
GFR calc Af Amer: >60 mL/min (ref >60)
GFR calc non Af Amer: >60 mL/min (ref >60)
GLUCOSE: 104 mg/dL — AB (ref 65–99)
Potassium: 3.8 mmol/L (ref 3.5–5.1)
SODIUM: 139 mmol/L (ref 135–145)
Total Bilirubin: 1.3 mg/dL — ABNORMAL HIGH (ref 0.3–1.2)
Total Protein: 8.1 g/dL (ref 6.5–8.1)

## 2016-08-31 MED ORDER — FENTANYL CITRATE (PF) 100 MCG/2ML IJ SOLN
INTRAMUSCULAR | Status: AC | PRN
  Administered 2016-08-31: 50 ug via INTRAVENOUS

## 2016-08-31 MED ORDER — MIDAZOLAM HCL 2 MG/2ML IJ SOLN
INTRAMUSCULAR | Status: AC
  Filled 2016-08-31: qty 6

## 2016-08-31 MED ORDER — KETOROLAC TROMETHAMINE 30 MG/ML IJ SOLN
15.0000 mg | Freq: Once | INTRAMUSCULAR | Status: AC | PRN
  Administered 2016-08-31: 15 mg via INTRAVENOUS
  Filled 2016-08-31: qty 1

## 2016-08-31 MED ORDER — MIDAZOLAM HCL 2 MG/2ML IJ SOLN
INTRAMUSCULAR | Status: AC | PRN
  Administered 2016-08-31: 1 mg via INTRAVENOUS
  Administered 2016-08-31: 2 mg via INTRAVENOUS

## 2016-08-31 MED ORDER — ONDANSETRON HCL 4 MG/2ML IJ SOLN
INTRAMUSCULAR | Status: AC | PRN
  Administered 2016-08-31: 4 mg via INTRAVENOUS

## 2016-08-31 MED ORDER — SODIUM CHLORIDE 0.9 % IV SOLN
INTRAVENOUS | Status: DC
  Administered 2016-08-31: 12:00:00 via INTRAVENOUS

## 2016-08-31 MED ORDER — FENTANYL CITRATE (PF) 100 MCG/2ML IJ SOLN
INTRAMUSCULAR | Status: AC
  Filled 2016-08-31: qty 4

## 2016-08-31 MED ORDER — ONDANSETRON HCL 4 MG/2ML IJ SOLN
INTRAMUSCULAR | Status: AC
  Filled 2016-08-31: qty 2

## 2016-08-31 NOTE — H&P (Signed)
Referring Physician(s): Pyrtle,Jay M  Supervising Physician: Corrie Mckusick  Patient Status:  [facility] OP  Chief Complaint:  "I'm having a liver biopsy"  Subjective: Patient familiar to IR service from prior random core liver biopsy in 2011. She has a known history of NASH with advanced fibrosis noted on recent elastography. Recent ultrasound also demonstrated a coarse hepatic echotexture with mildly nodular contours suggesting early cirrhosis. No suspicious masses or lesions within the liver were noted. She presents today for ultrasound-guided random core liver biopsy for further evaluation. She currently denies fever, chest pain, dyspnea, abdominal/back pain, nausea, vomiting or abnormal bleeding. She does have intermittent headaches. Past Medical History:  Diagnosis Date  . Allergy   . Anemia    hx  . Anxiety    takes Klonopin daily as needed.takes Celexa daily  . Arthritis    left foot and hands  . Asthma    exercise induced  . Complication of anesthesia    slow to wake  up  . High cholesterol    takes Pravastatin daily  . History of bronchitis    several yrs ago  . History of migraine    last one about 2 wks ago  . Hypertension   . IBS (irritable bowel syndrome)    takes Trulance and ProBiotic Daily  . Inflammation of hair follicles    takes Minocycline daily  . Insomnia    takes Melatonin nightly  . Liver fibrosis ([place])   . Migraine   . NASH (nonalcoholic steatohepatitis)   . Sleep apnea    sleep study > 5 yrs ago. Uses CPAP  . Spastic bladder    takes Myrbetriq daily  . Tingling    r/t neck. Pseudoarthrosis   . UTI (urinary tract infection)    hx of but takes Macrodantin daily. has been on for 2 1/2 yrs    Past Surgical History:  Procedure Laterality Date  . ABDOMINAL HYSTERECTOMY  2005  . BREAST REDUCTION SURGERY  2009  . CERVICAL DISC SURGERY  2007, 2009, 2014, 2017  . CESAREAN SECTION  94/03   x 2  . COLONOSCOPY    . ESOPHAGOGASTRODUODENOSCOPY    .  POSTERIOR CERVICAL FUSION/FORAMINOTOMY N/A 01/07/2013   Procedure: POSTERIOR CERVICAL FUSION/FORAMINOTOMY LEVEL 2;  Surgeon: Hosie Spangle, MD;  Location: [facility];  Service: Neurosurgery;  Laterality: N/A;  Posterior Cervical Five-Six/Six-Seven Arthrodesis with Instrumentation  . POSTERIOR CERVICAL FUSION/FORAMINOTOMY N/A 07/04/2016   Procedure: CERVICAL THREE-FOUR POSTERIOR CERVICAL ARTHRODESIS;  Surgeon: Jovita Gamma, MD;  Location: [facility];  Service: Neurosurgery;  Laterality: N/A;  C3-C4 POSTERIOR CERVICAL ARTHRODESIS  . tummy tuck       Allergies: Dairy aid [lactase]; Gluten meal; Levaquin [levofloxacin in d5w]; Topamax [topiramate]; and Wheat bran  Medications: Prior to Admission medications   Medication Sig Start Date End Date Taking? Authorizing Provider  clindamycin (CLEOCIN T) 1 % lotion Apply 1 application topically 2 (two) times daily as needed (ACNE BREAKOUTS).  05/03/16  Yes Historical Provider, MD  KRILL OIL PO Take 1 tablet by mouth daily.   Yes Historical Provider, MD  Minocycline HCl (SOLODYN) 115 MG TB24 Take 115 mg by mouth every evening.   Yes Historical Provider, MD  MYRBETRIQ 50 MG TB24 tablet TAKE 1 TABLET BY MOUTH EVERY DAY Patient taking differently: TAKE 1 TABLET BY MOUTH EVERY EVENING 09/24/15  Yes Florian Buff, MD  Omega-3 Fatty Acids (OMEGA-3 FISH OIL PO) Take 1,400 mg by mouth 2 (two) times daily.  Yes Historical Provider, MD  oxyCODONE (OXY IR/ROXICODONE) 5 MG immediate release tablet Take 1-2 tablets (5-10 mg total) by mouth every 4 (four) hours as needed (pain). 07/05/16  Yes Jovita Gamma, MD  pravastatin (PRAVACHOL) 20 MG tablet TAKE 1 TABLET (20 MG TOTAL) BY MOUTH EVERY EVENING. Patient taking differently: TAKE 0.5 TABLET (10 MG TOTAL) BY MOUTH EVERY EVENING. 01/19/16  Yes Kathyrn Drown, MD  thyroid (ARMOUR) 30 MG tablet Take 30 mg by mouth daily before breakfast.   Yes Historical Provider, MD  traMADol (ULTRAM) 50 MG tablet Take 50-100 mg by  mouth 2 (two) times daily as needed for moderate pain. DEPENDS ON PAIN IF TAKES 50-100 MG 06/10/16  Yes Historical Provider, MD  TURMERIC PO Take 1 tablet by mouth daily.   Yes Historical Provider, MD  acyclovir (ZOVIRAX) 400 MG tablet TAKE 1 TABLET EVERY DAY 07/26/16   Kathyrn Drown, MD  Albuterol Sulfate (PROAIR RESPICLICK IN) Inhale 2 puffs into the lungs as needed (SHORTNESS NOF BREATH).    Historical Provider, MD  AMBULATORY NON FORMULARY MEDICATION Syntrinol Take 2 tablets by mouth once daily    Historical Provider, MD  AMBULATORY NON FORMULARY MEDICATION Take 2 tablets by mouth daily. Liver Restorer    Historical Provider, MD  AMBULATORY NON FORMULARY MEDICATION Methylation Take 1 tablet by mouth daily    Historical Provider, MD  ASHWAGANDHA PO Take 2 tablets by mouth daily.    Historical Provider, MD  Barberry-Oreg Grape-Goldenseal (BERBERINE COMPLEX PO) Take 2 tablets by mouth daily.    Historical Provider, MD  Cholecalciferol (VITAMIN D3) 5000 units CAPS Take 5,000 Units by mouth daily.     Historical Provider, MD  citalopram (CELEXA) 40 MG tablet TAKE 1 TABLET EVERY DAY 04/14/16   Kathyrn Drown, MD  clonazePAM (KLONOPIN) 1 MG tablet TAKE 1/2-1 TABLET BY MOUTH TWICE A DAY AS NEEDED Patient taking differently: TAKE 1  TABLET DAILY 05/04/16   Kathyrn Drown, MD  Coenzyme Q10 (CO Q10 PO) Take 1 tablet by mouth daily.    Historical Provider, MD  cyclobenzaprine (FLEXERIL) 10 MG tablet Take 0.5-1 tablets (5-10 mg total) by mouth 3 (three) times daily as needed for muscle spasms. 07/05/16   Jovita Gamma, MD  DANDELION PO Take 2 tablets by mouth daily.    Historical Provider, MD  estradiol (ESTRACE) 1 MG tablet Take 1 mg by mouth daily.    Historical Provider, MD  Flaxseed, Linseed, (FLAX PO) Take 1 tablet by mouth daily.    Historical Provider, MD  fluticasone (FLONASE) 50 MCG/ACT nasal spray USE 2 SPRAYS IN EACH NOSTRIL EVERY DAY Patient taking differently: Place 2 sprays into both  nostrils daily as needed.  03/19/14   Kathyrn Drown, MD  ibuprofen (ADVIL,MOTRIN) 800 MG tablet Take 800 mg by mouth every 8 (eight) hours as needed. for pain 05/26/16   Historical Provider, MD  Melatonin 5 MG TABS Take 5 mg by mouth at bedtime as needed for sleep.    Historical Provider, MD  Menaquinone-7 (VITAMIN K2 PO) Take 1 tablet by mouth daily.    Historical Provider, MD  montelukast (SINGULAIR) 10 MG tablet TAKE 1 TABLET (10 MG TOTAL) BY MOUTH AT BEDTIME. 06/27/16   Kathyrn Drown, MD  Multiple Vitamin (MULTIVITAMIN) tablet Take 2 tablets by mouth daily.    Historical Provider, MD  NIACIN CR PO Take 2 tablets by mouth every evening.     Historical Provider, MD  nitrofurantoin (MACRODANTIN) 100 MG capsule Take  100 mg by mouth at bedtime.  03/07/14   Historical Provider, MD  Nutritional Supplements (DHEA PO) Take 1 tablet by mouth daily.    Historical Provider, MD  OVER THE COUNTER MEDICATION Take 1 tablet by mouth daily. Pregnenolone    Historical Provider, MD  Plecanatide (TRULANCE) 3 MG TABS Take 1 tablet by mouth daily. 03/31/16   Jerene Bears, MD  Probiotic Product (PROBIOTIC DAILY PO) Take 2 tablets by mouth daily.    Historical Provider, MD  RESVERATROL PO Take 1 tablet by mouth daily.    Historical Provider, MD  thyroid (ARMOUR) 120 MG tablet Take 120 mg by mouth daily before breakfast.    Historical Provider, MD  vitamin E (VITAMIN E) 400 UNIT capsule Take 2 capsules (800 Units total) by mouth daily. 10/12/15   Jerene Bears, MD     Vital Signs: BP (!) 138/93 (BP Location: Left Arm)   Pulse 88   Temp 97.9 F (36.6 C) (Oral)   Ht 5' 4.5" (1.638 m)   Wt 202 lb 2 oz (91.7 kg)   SpO2 100%   BMI 34.16 kg/m   Physical Exam patient awake, alert. Chest clear to auscultation bilaterally. Heart with regular rate and rhythm. Abdomen obese, soft, positive bowel sounds, nontender. Lower extremities with some trace edema bilaterally.  Imaging: No results found.  Labs:  CBC:  Recent  Labs  03/30/16 0846 06/27/16 0922 08/25/16 0954 08/31/16 1109  WBC 10.6* 9.0 10.9* 8.1  HGB 14.7 13.7 13.6 13.2  HCT 43.0 42.1 39.8 40.0  PLT 318.0 341 293.0 304    COAGS:  Recent Labs  03/15/16 1044 08/25/16 0954  INR 1.1* 1.0    BMP:  Recent Labs  03/15/16 1044 06/27/16 1145 08/25/16 0954 08/31/16 1109  NA 138 138 140 139  K 4.5 3.8 4.6 3.8  CL 102 105 103 103  CO2 26 23 33* 25  GLUCOSE 144* 84 118* 104*  BUN 13 13 15 12   CALCIUM 9.8 9.5 9.6 9.2  CREATININE 0.96 0.93 0.82 0.79  GFRNONAA  --  >60  --  >60  GFRAA  --  >60  --  >60    LIVER FUNCTION TESTS:  Recent Labs  03/15/16 1044 06/27/16 1145 08/25/16 0954 08/31/16 1109  BILITOT 1.1 1.2 0.7 1.3*  AST 88* 49* 69* 169*  ALT 58* 46 68* 138*  ALKPHOS 117 104 124* 120  PROT 8.8* 8.2* 7.3 8.1  ALBUMIN 4.8 4.1 4.3 4.4    Assessment and Plan: Pt with known history of NASH with advanced fibrosis noted on recent elastography. Recent ultrasound also demonstrated a coarse hepatic echotexture with mildly nodular contours suggesting early cirrhosis. No suspicious masses or lesions within the liver were noted. AST/ALT and t bili are elevated today. She presents today for ultrasound-guided random core liver biopsy for further evaluation. Last core liver biopsy in 2011 revealed steatohepatitis. Risks and benefits discussed with the patient/husband including, but not limited to bleeding, infection, damage to adjacent structures or low yield requiring additional tests.All of the patient's questions were answered, patient is agreeable to proceed.Consent signed and in chart.     Electronically Signed: D. Nash Mantis 08/31/2016, 12:08 PM   I spent a total of 20 minutes at the the patient's bedside AND on the patient's hospital floor or unit, greater than 50% of which was counseling/coordinating care for ultrasound-guided random core liver biopsy

## 2016-08-31 NOTE — Procedures (Signed)
Interventional Radiology Procedure Note  Procedure: US guided bx of liver.  Medical liver concern for NASH.  Complications: None Recommendations:  - Ok to shower tomorrow - Do not submerge for 7 days - Routine care - 2 hours obs   Signed,  Dulcy Fanny. Earleen Newport, DO

## 2016-08-31 NOTE — Discharge Instructions (Signed)
Liver Biopsy, Care After Introduction Refer to this sheet in the next few weeks. These instructions provide you with information on caring for yourself after your procedure. Your health care provider may also give you more specific instructions. Your treatment has been planned according to current medical practices, but problems sometimes occur. Call your health care provider if you have any problems or questions after your procedure. What can I expect after the procedure? After your procedure, it is typical to have the following:  A small amount of discomfort in the area where the biopsy was done and in the right shoulder or shoulder blade.  A small amount of bruising around the area where the biopsy was done and on the skin over the liver.  Sleepiness and fatigue for the rest of the day. Follow these instructions at home:  Rest at home for 1-2 days or as directed by your health care provider.  Have a friend or family member stay with you for at least 24 hours.  Because of the medicines used during the procedure, you should not do the following things in the first 24 hours:  Drive.  Use machinery.  Be responsible for the care of other people.  Sign legal documents.  Take a bath or shower.  There are many different ways to close and cover an incision, including stitches, skin glue, and adhesive strips. Follow your health care provider's instructions on:  Incision care.  Bandage (dressing) changes and removal.  Incision closure removal.  Do not drink alcohol in the first week.  Do not lift more than 5 pounds or play contact sports for 2 weeks after this test.  Take medicines only as directed by your health care provider. Do not take medicine containing aspirin or non-steroidal anti-inflammatory medicines such as ibuprofen for 1 week after this test.  It is your responsibility to get your test results. Contact a health care provider if:  You have increased bleeding from  an incision that results in more than a small spot of blood.  You have redness, swelling, or increasing pain in any incisions.  You notice a discharge or a bad smell coming from any of your incisions.  You have a fever or chills. Get help right away if:  You develop swelling, bloating, or pain in your abdomen.  You become dizzy or faint.  You develop a rash.  You are nauseous or vomit.  You have difficulty breathing, feel short of breath, or feel faint.  You develop chest pain.  You have problems with your speech or vision.  You have trouble balancing or moving your arms or legs. This information is not intended to replace advice given to you by your health care provider. Make sure you discuss any questions you have with your health care provider. Document Released: 01/28/2005 Document Revised: 12/17/2015 Document Reviewed: 09/06/2013  2017 Elsevier Moderate Conscious Sedation, Adult, Care After These instructions provide you with information about caring for yourself after your procedure. Your health care provider may also give you more specific instructions. Your treatment has been planned according to current medical practices, but problems sometimes occur. Call your health care provider if you have any problems or questions after your procedure. What can I expect after the procedure? After your procedure, it is common:  To feel sleepy for several hours.  To feel clumsy and have poor balance for several hours.  To have poor judgment for several hours.  To vomit if you eat too soon. Follow these instructions  at home: For at least 24 hours after the procedure:   Do not:  Participate in activities where you could fall or become injured.  Drive.  Use heavy machinery.  Drink alcohol.  Take sleeping pills or medicines that cause drowsiness.  Make important decisions or sign legal documents.  Take care of children on your own.  Rest. Eating and  drinking  Follow the diet recommended by your health care provider.  If you vomit:  Drink water, juice, or soup when you can drink without vomiting.  Make sure you have little or no nausea before eating solid foods. General instructions  Have a responsible adult stay with you until you are awake and alert.  Take over-the-counter and prescription medicines only as told by your health care provider.  If you smoke, do not smoke without supervision.  Keep all follow-up visits as told by your health care provider. This is important. Contact a health care provider if:  You keep feeling nauseous or you keep vomiting.  You feel light-headed.  You develop a rash.  You have a fever. Get help right away if:  You have trouble breathing. This information is not intended to replace advice given to you by your health care provider. Make sure you discuss any questions you have with your health care provider. Document Released: 05/01/2013 Document Revised: 12/14/2015 Document Reviewed: 10/31/2015 Elsevier Interactive Patient Education  2017 [employer].

## 2016-09-06 ENCOUNTER — Other Ambulatory Visit: Payer: Self-pay | Admitting: Student

## 2016-09-06 ENCOUNTER — Telehealth: Payer: Self-pay | Admitting: Student

## 2016-09-06 DIAGNOSIS — R109 Unspecified abdominal pain: Secondary | ICD-10-CM

## 2016-09-06 NOTE — Progress Notes (Signed)
Patient s/p liver biopsy 08/31/16 called with complaint of abdominal pain since biopsy that has not improved.  Patient reports she is having moderate soreness and occasional sharp pain particularly with movement.  She states the biopsy site is scabbed, without erythema or warmth.  She is otherwise feeling well.  She has been taking toradol, tramadol, and flixiril with improvement in pain.  Discussed case with Dr. Pascal Lux who feels patient should be seen for CT scan of her liver.   CT Abdomen without contrast ordered.  Scheduled for 10am.  Patient aware and agreeable.   [name] 09/06/16  4:56 PM

## 2016-09-07 ENCOUNTER — Ambulatory Visit (HOSPITAL_COMMUNITY)
Admission: RE | Admit: 2016-09-07 | Discharge: 2016-09-07 | Disposition: A | Discharge status: Home / Self Care | Payer: BLUE CROSS/BLUE SHIELD | Source: Ambulatory Visit | Attending: Student | Admitting: Student

## 2016-09-07 DIAGNOSIS — N200 Calculus of kidney: Secondary | ICD-10-CM | POA: Insufficient documentation

## 2016-09-07 DIAGNOSIS — R1011 Right upper quadrant pain: Secondary | ICD-10-CM | POA: Diagnosis not present

## 2016-09-07 NOTE — Progress Notes (Signed)
Patient ID: [name], female   DOB: [birth year], 46 y.o.   MRN: [id number]   Pt underwent random liver core biopsy 08/31/16 Continues to have RUQ pain  With deep breaths and certain movement  Was asked to come to [facility] for CT scan to evaluate area.  CT today: IMPRESSION: Liver is prominent without focal lesion evident. In particular, there is no evidence of hemorrhage or inflammatory appearing lesion in the liver. No evidence of abscess. No perihepatic fluid. Cysts in left kidney with several nonobstructing calculi in the lower pole left kidney. Study otherwise unremarkable.  Discussed with pt these findings She denies fever; chills Rec: Ibuprofen 400 mg po every 4-6 hrs. If still no relief; see PMD for evaluation  She has good understanding and agreeable

## 2016-09-19 ENCOUNTER — Ambulatory Visit (INDEPENDENT_AMBULATORY_CARE_PROVIDER_SITE_OTHER): Payer: BLUE CROSS/BLUE SHIELD | Admitting: Internal Medicine

## 2016-09-19 DIAGNOSIS — Z23 Encounter for immunization: Secondary | ICD-10-CM

## 2016-09-22 ENCOUNTER — Other Ambulatory Visit: Payer: Self-pay | Admitting: Obstetrics & Gynecology

## 2016-09-22 ENCOUNTER — Telehealth: Payer: Self-pay | Admitting: Family Medicine

## 2016-09-22 ENCOUNTER — Other Ambulatory Visit: Payer: Self-pay | Admitting: Internal Medicine

## 2016-09-22 ENCOUNTER — Other Ambulatory Visit: Payer: Self-pay | Admitting: Family Medicine

## 2016-09-22 NOTE — Telephone Encounter (Signed)
#  1-I more than happy for [place] to go ahead and give a prescription for new CPAP machine but it's not as easy as it seems-when was the last sleep study? What is her pressures that the CPAP machine uses? Also find out if she once this prescription given to her or sent in? #2-it is not unusual for insurance companies to refuse to fill a prescription for a CPAP machine unless there is a recent sleep study so therefore if her prescription for a CPAP machine is denied this will require a face-to-face visit evaluation for documentation purposes followed by ordering a new sleep study followed by prescribing CPAP machine once a new sleep study is completed #3-very complex issue, we have no control over the rules insurance companies use in these issues

## 2016-09-22 NOTE — Telephone Encounter (Signed)
Patient is requesting Rx for a new c-pap machine.   [facility]

## 2016-09-23 NOTE — Telephone Encounter (Signed)
Please sign order for new replacement CPAP in red folder in yellow box  Please forward to [place] to be faxed with all required documentation   [place] Fax# [phone], Ph# [phone]

## 2016-09-25 NOTE — Telephone Encounter (Signed)
This was completed as requested thank you

## 2016-09-26 NOTE — Telephone Encounter (Signed)
Faxed order with all requested documentation

## 2016-10-07 ENCOUNTER — Other Ambulatory Visit: Payer: Self-pay | Admitting: Internal Medicine

## 2016-10-11 ENCOUNTER — Other Ambulatory Visit: Payer: Self-pay | Admitting: Internal Medicine

## 2016-12-21 DIAGNOSIS — G5600 Carpal tunnel syndrome, unspecified upper limb: Secondary | ICD-10-CM | POA: Insufficient documentation

## 2016-12-23 ENCOUNTER — Other Ambulatory Visit: Payer: Self-pay | Admitting: Family Medicine

## 2017-01-17 ENCOUNTER — Encounter: Payer: Self-pay | Admitting: Family Medicine

## 2017-01-17 ENCOUNTER — Ambulatory Visit (INDEPENDENT_AMBULATORY_CARE_PROVIDER_SITE_OTHER): Payer: BLUE CROSS/BLUE SHIELD | Admitting: Family Medicine

## 2017-01-17 VITALS — BP 124/90 | Ht 64.5 in | Wt 197.0 lb

## 2017-01-17 DIAGNOSIS — G4733 Obstructive sleep apnea (adult) (pediatric): Secondary | ICD-10-CM

## 2017-01-17 DIAGNOSIS — R0683 Snoring: Secondary | ICD-10-CM | POA: Diagnosis not present

## 2017-01-17 NOTE — Progress Notes (Signed)
   Subjective:    Patient ID: [name], female    DOB: October 02, 1970, 46 y.o.   MRN: [id number]  HPIMed check up.   Needs rx for cpap machine. Pt states last sleep study was about 10 years ago.   No concerns today.  Gets labs thru [facility] [name] 6.1 TC 192 hdl 40 tg 207 ldl 120 Her thyroid cholesterol and [name] are covered by her integrative health specialist Previous one at [place] [facility] lab Significant sleep apnea issues snoring fatigue tiredness daytime sleepiness has had a sleep study 10 years ago which showed sleep apnea probably needs another one now in order to get this covered through insurance Review of Systems Denies chest tightness pressure pain shortness breath nausea vomiting diarrhea    Objective:   Physical Exam  Lungs clear heart regular throat normal neck no masses      Assessment & Plan:  Sleep apnea-face-to-face today Needs sleep study Has not had sleep study in 10 years Need CPAP machine Patient is interested in nasal CPAP if possible

## 2017-01-18 ENCOUNTER — Other Ambulatory Visit: Payer: Self-pay | Admitting: Family Medicine

## 2017-01-18 NOTE — Telephone Encounter (Signed)
Last seen 01/18/17

## 2017-01-19 ENCOUNTER — Telehealth: Payer: Self-pay | Admitting: *Deleted

## 2017-01-19 NOTE — Telephone Encounter (Signed)
See 08/31/16 surg path note. Patient no longer needs every 6 months [place] screening at this time but does need office follow up every 6 months.

## 2017-01-19 NOTE — Telephone Encounter (Signed)
-----   Message from [name], [name] sent at 08/12/2016 10:58 AM EST ----- See 08/08/16 imaging report. Patient needs repeat abdominal ultrasound for [name] screening around 01/2017.

## 2017-01-22 NOTE — Telephone Encounter (Signed)
May have this +4 additional refills

## 2017-01-24 ENCOUNTER — Other Ambulatory Visit: Payer: Self-pay | Admitting: Family Medicine

## 2017-01-29 ENCOUNTER — Other Ambulatory Visit: Payer: Self-pay | Admitting: Family Medicine

## 2017-01-31 ENCOUNTER — Other Ambulatory Visit: Payer: Self-pay | Admitting: *Deleted

## 2017-01-31 MED ORDER — MONTELUKAST SODIUM 10 MG PO TABS: ORAL_TABLET | ORAL | 5 refills | Status: DC

## 2017-02-13 ENCOUNTER — Telehealth: Payer: Self-pay | Admitting: Family Medicine

## 2017-02-13 NOTE — Telephone Encounter (Signed)
Please sign order in red folder in yellow box for replacement CPAP  Pt's insurance does not require new sleep study  54Please forward to [place] to fax with OV note

## 2017-02-13 NOTE — Telephone Encounter (Signed)
The form was signed, Danae Chen can get the medical record, if you need anything else dictated please let me know-thank you

## 2017-02-15 ENCOUNTER — Other Ambulatory Visit: Payer: Self-pay | Admitting: *Deleted

## 2017-02-15 MED ORDER — MONTELUKAST SODIUM 10 MG PO TABS: ORAL_TABLET | ORAL | 1 refills | Status: DC

## 2017-02-17 DIAGNOSIS — Z9889 Other specified postprocedural states: Secondary | ICD-10-CM | POA: Insufficient documentation

## 2017-02-22 HISTORY — PX: OTHER SURGICAL HISTORY: SHX169

## 2017-03-01 ENCOUNTER — Encounter: Payer: Self-pay | Admitting: Internal Medicine

## 2017-03-14 ENCOUNTER — Other Ambulatory Visit: Payer: Self-pay | Admitting: Family Medicine

## 2017-03-14 NOTE — Telephone Encounter (Signed)
Last seen 01/19/17

## 2017-05-15 ENCOUNTER — Encounter: Payer: Self-pay | Admitting: Internal Medicine

## 2017-05-15 ENCOUNTER — Ambulatory Visit (INDEPENDENT_AMBULATORY_CARE_PROVIDER_SITE_OTHER): Payer: BLUE CROSS/BLUE SHIELD | Admitting: Internal Medicine

## 2017-05-15 VITALS — BP 136/82 | HR 76 | Ht 65.0 in | Wt 183.4 lb

## 2017-05-15 DIAGNOSIS — K7581 Nonalcoholic steatohepatitis (NASH): Secondary | ICD-10-CM | POA: Diagnosis not present

## 2017-05-15 DIAGNOSIS — K581 Irritable bowel syndrome with constipation: Secondary | ICD-10-CM | POA: Diagnosis not present

## 2017-05-15 NOTE — Patient Instructions (Signed)
Continue current medications and supplements.  Follow up with Dr Hilarie Fredrickson in 1 year.  If you are age 48 or older, your body mass index should be between 23-30. Your Body mass index is 30.52 kg/m. If this is out of the aforementioned range listed, please consider follow up with your Primary Care Provider.  If you are age 67 or younger, your body mass index should be between 19-25. Your Body mass index is 30.52 kg/m. If this is out of the aformentioned range listed, please consider follow up with your Primary Care Provider.

## 2017-05-15 NOTE — Progress Notes (Signed)
Subjective:    Patient ID: [name], female    DOB: 10-02-1970, 46 y.o.   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 She is here alone today and was last seen on 08/25/2016. After her last visit we made the decision to perform a liver biopsy for definitive understanding of her liver disease and stage her fibrosis. Clinical suspicion was not that of cirrhosis.  Liver biopsy was performed on 08/31/2016 and revealed steatohepatitis with a total NASH score of 6. Her fibrosis was 2, portal/periportal without pericellular/peri-sinusoidal fibrosis.  She has maintained vitamin E 800 international units daily. She's been working with her diet to reduce her weight. She is eating a low carb diet focused on meats and greens. With this she has lost nearly 20 pounds. She is starting to feel significantly better. She has been seen by [facility] and is taking multiple supplements for her cholesterol and fatty liver which include but are not limited to synthrinol, liver restorer, methylation, ashwagandha, berberine, dandelion, DHEA, flax, krill oil, milk thistle, niacin, fish oil, macca, pregnenolone, lecithin, turmeric, vit k2 and vit d3.    She is still having some issues with constipation and has been on Trulance. She does not use Trulance daily because when she does she can get loose stools. She is trying to find the right "balance" with this medication.  She is avoiding alcohol but admits to drinking one drink per week on average, usually on the weekend. When she drinks she drinks liquor.  No abdominal swelling, lower extremity edema, jaundice, itching or bleeding.   Review of Systems As per history of present illness, otherwise negative  Current Medications, Allergies, Past Medical History, Past Surgical History, Family History and Social History were  reviewed in [employer] record.     Objective:   Physical Exam BP 136/82   Pulse 76   Ht 5' 5"  (1.651 m)   Wt 183 lb 6 oz (83.2 kg)   BMI 30.52 kg/m  Constitutional: Well-developed and well-nourished. No distress. HEENT: Normocephalic and atraumatic. Oropharynx is clear and moist. Conjunctivae are normal.  No scleral icterus. Neck: Neck supple. Trachea midline. Cardiovascular: Normal rate, regular rhythm and intact distal pulses. No M/R/G Pulmonary/chest: Effort normal and breath sounds normal. No wheezing, rales or rhonchi. Abdominal: Soft, nontender, nondistended. Bowel sounds active throughout. There are no masses palpable. No hepatosplenomegaly. Extremities: no clubbing, cyanosis, or edema Neurological: Alert and oriented to person place and time. Skin: Skin is warm and dry. Psychiatric: Normal mood and affect. Behavior is normal.  She had labs done on 04/17/2017 and she was able to review these labs with me on her patient portal. 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   1. NASH with F2 fibrosis -- now biopsy-proven. We reviewed the biopsy results today. She has made very significant lifestyle modifications and has lost 20 pounds. She's also been on vitamin E. She is on multiple other supplements through [facility]. Some of the supplements I'm unfamiliar with and I have explained this to her today. I am cautious with supplements as we cannot  always vouch for purity and contaminants. Milk thistle may be helpful and is very unlikely harmful. --However, she has completely normalized her liver enzymes which is remarkable --Minimizing liver inflammation through diet, exercise, vitamin E therapy and controlling risk  factors like blood pressure and cholesterol should prevent further scarring/fibrosis. I have advised that she avoid alcohol. --I recommend checking liver function about every 6 months and she can see me annually. --She will ask her providers at [facility] to fax me a copy of all lab results --No evidence for cirrhosis and therefore no reason for [facility] screening or variceal screening --Annual flu vaccine recommended  2. IBS with constipation -- I recommended she try Trulance every other day to help avoid constipation. She will call if this is not helping  3. CRC screening/family history of colon cancer -- normal colonoscopy in April 2017, repeat April 2022  25 minutes spent with the patient today. Greater than 50% was spent in counseling and coordination of care with the patient

## 2017-05-19 ENCOUNTER — Ambulatory Visit (INDEPENDENT_AMBULATORY_CARE_PROVIDER_SITE_OTHER): Payer: BLUE CROSS/BLUE SHIELD | Admitting: *Deleted

## 2017-05-19 DIAGNOSIS — Z23 Encounter for immunization: Secondary | ICD-10-CM

## 2017-05-29 ENCOUNTER — Other Ambulatory Visit: Payer: Self-pay

## 2017-05-29 MED ORDER — MONTELUKAST SODIUM 10 MG PO TABS: ORAL_TABLET | ORAL | 1 refills | Status: DC

## 2017-06-14 ENCOUNTER — Other Ambulatory Visit: Payer: Self-pay | Admitting: Family Medicine

## 2017-06-20 ENCOUNTER — Encounter: Payer: Self-pay | Admitting: Obstetrics & Gynecology

## 2017-06-20 ENCOUNTER — Ambulatory Visit (INDEPENDENT_AMBULATORY_CARE_PROVIDER_SITE_OTHER): Payer: BLUE CROSS/BLUE SHIELD | Admitting: Obstetrics & Gynecology

## 2017-06-20 ENCOUNTER — Other Ambulatory Visit: Payer: Self-pay

## 2017-06-20 VITALS — BP 142/95 | HR 79 | Ht 65.0 in | Wt 189.0 lb

## 2017-06-20 DIAGNOSIS — Z01419 Encounter for gynecological examination (general) (routine) without abnormal findings: Secondary | ICD-10-CM

## 2017-06-20 MED ORDER — MIRABEGRON ER 50 MG PO TB24: 50.0000 mg | ORAL_TABLET | Freq: Every day | ORAL | 3 refills | Status: DC

## 2017-06-20 MED ORDER — ESTRADIOL 1 MG PO TABS: 1.0000 mg | ORAL_TABLET | Freq: Every day | ORAL | 3 refills | Status: DC

## 2017-06-20 MED ORDER — ESTRADIOL 0.1 MG/GM VA CREA: TOPICAL_CREAM | VAGINAL | 12 refills | Status: DC

## 2017-06-20 NOTE — Progress Notes (Signed)
Subjective:     [name] is a 46 y.o. female here for a routine exam.  No LMP recorded. Patient has had a hysterectomy. G2P2 Birth Control Method:  hysterectomy Menstrual Calendar(currently): N/A  Current complaints: having some vaginal itching. No burning or odor or dysuria. Sometimes has pain with intercourse, deep, not with insertion.  Current acute medical issues:  none   Recent Gynecologic History No LMP recorded. Patient has had a hysterectomy. Last Pap: N/A Last mammogram: 2016,  normal  Past Medical History:  Diagnosis Date  . Allergy   . Anemia    hx  . Anxiety    takes Klonopin daily as needed.takes Celexa daily  . Arthritis    left foot and hands  . Asthma    exercise induced  . Complication of anesthesia    slow to wake  up  . High cholesterol    takes Pravastatin daily  . History of bronchitis    several yrs ago  . History of migraine    last one about 2 wks ago  . Hypertension   . IBS (irritable bowel syndrome)    takes Trulance and ProBiotic Daily  . Inflammation of hair follicles    takes Minocycline daily  . Insomnia    takes Melatonin nightly  . Liver fibrosis   . Migraine   . NASH (nonalcoholic steatohepatitis)   . Sleep apnea    sleep study > 5 yrs ago. Uses CPAP  . Spastic bladder    takes Myrbetriq daily  . Tingling    r/t neck. Pseudoarthrosis   . UTI (urinary tract infection)    hx of but takes Macrodantin daily. has been on for 2 1/2 yrs     Past Surgical History:  Procedure Laterality Date  . ABDOMINAL HYSTERECTOMY  2005  . BREAST REDUCTION SURGERY  2009  . carpel tunnel Left 02/2017  . CERVICAL DISC SURGERY  2007, 2009, 2014, 2017  . CESAREAN SECTION  94/03   x 2  . COLONOSCOPY    . ESOPHAGOGASTRODUODENOSCOPY    . POSTERIOR CERVICAL FUSION/FORAMINOTOMY N/A 01/07/2013   Procedure: POSTERIOR CERVICAL FUSION/FORAMINOTOMY LEVEL 2;  Surgeon: Hosie Spangle, MD;  Location: [facility];  Service: Neurosurgery;  Laterality:  N/A;  Posterior Cervical Five-Six/Six-Seven Arthrodesis with Instrumentation  . POSTERIOR CERVICAL FUSION/FORAMINOTOMY N/A 07/04/2016   Procedure: CERVICAL THREE-FOUR POSTERIOR CERVICAL ARTHRODESIS;  Surgeon: Jovita Gamma, MD;  Location: [facility];  Service: Neurosurgery;  Laterality: N/A;  C3-C4 POSTERIOR CERVICAL ARTHRODESIS  . tummy tuck      OB History    Gravida Para Term Preterm AB Living   2 2       2    SAB TAB Ectopic Multiple Live Births                  Social History   Socioeconomic History  . Marital status: Married    Spouse name: Laverna Peace  . Number of children: 2  . Years of education: college  . Highest education level: None  Social Needs  . Financial resource strain: None  . Food insecurity - worry: None  . Food insecurity - inability: None  . Transportation needs - medical: None  . Transportation needs - non-medical: None  Occupational History  . Occupation: self employed  Tobacco Use  . Smoking status: Never Smoker  . Smokeless tobacco: Never Used  Substance and Sexual Activity  . Alcohol use: Yes    Alcohol/week: 0.0 oz    Comment:  occasional  . Drug use: No  . Sexual activity: Yes    Birth control/protection: Surgical  Other Topics Concern  . None  Social History Narrative  . None    Family History  Problem Relation Age of Onset  . Cirrhosis Father        alcohol abuse  . Diabetes Father   . Hypertension Father   . Ovarian cancer Mother   . Hypertension Mother   . Irritable bowel syndrome Mother   . Colon cancer Maternal Grandmother   . CAD Maternal Grandfather   . Drug abuse Brother   . Colon polyps Neg Hx   . Esophageal cancer Neg Hx   . Rectal cancer Neg Hx   . Stomach cancer Neg Hx      Current Outpatient Medications:  .  acyclovir (ZOVIRAX) 400 MG tablet, TAKE 1 TABLET EVERY DAY, Disp: 90 tablet, Rfl: 1 .  Albuterol Sulfate (PROAIR RESPICLICK IN), Inhale 2 puffs into the lungs as needed (SHORTNESS NOF BREATH)., Disp: , Rfl:  .   AMBULATORY NON FORMULARY MEDICATION, Syntrinol Take 2 tablets by mouth once daily, Disp: , Rfl:  .  AMBULATORY NON FORMULARY MEDICATION, Take 2 tablets by mouth daily. Liver Restorer, Disp: , Rfl:  .  AMBULATORY NON FORMULARY MEDICATION, Methylation Take 1 tablet by mouth daily, Disp: , Rfl:  .  ASHWAGANDHA PO, Take 2 tablets by mouth daily., Disp: , Rfl:  .  Barberry-Oreg Grape-Goldenseal (BERBERINE COMPLEX PO), Take 2 tablets by mouth daily., Disp: , Rfl:  .  Cholecalciferol (VITAMIN D3) 5000 units CAPS, Take 5,000 Units by mouth daily. , Disp: , Rfl:  .  citalopram (CELEXA) 40 MG tablet, TAKE 1 TABLET EVERY DAY, Disp: 90 tablet, Rfl: 3 .  clindamycin (CLEOCIN T) 1 % lotion, Apply 1 application topically 2 (two) times daily as needed (ACNE BREAKOUTS). , Disp: , Rfl: 2 .  clonazePAM (KLONOPIN) 1 MG tablet, TAKE 1/2 TO 1 TABLET BY MOUTH TWICE A DAY AS NEEDED, Disp: 60 tablet, Rfl: 4 .  Coenzyme Q10 (CO Q10 PO), Take 1 tablet by mouth daily., Disp: , Rfl:  .  [employer] VITAMIN E 400 units capsule, TAKE 2 CAPSULES BY MOUTH EVERY DAY, Disp: 180 capsule, Rfl: 0 .  cyclobenzaprine (FLEXERIL) 10 MG tablet, Take 0.5-1 tablets (5-10 mg total) by mouth 3 (three) times daily as needed for muscle spasms., Disp: 50 tablet, Rfl: 0 .  DANDELION PO, Take 2 tablets by mouth daily., Disp: , Rfl:  .  estradiol (ESTRACE) 1 MG tablet, Take 1 mg by mouth daily., Disp: , Rfl:  .  fluticasone (FLONASE) 50 MCG/ACT nasal spray, USE 2 SPRAYS IN EACH NOSTRIL EVERY DAY (Patient taking differently: Place 2 sprays into both nostrils daily as needed. ), Disp: 48 g, Rfl: 5 .  ibuprofen (ADVIL,MOTRIN) 800 MG tablet, Take 800 mg by mouth every 8 (eight) hours as needed. for pain, Disp: , Rfl: 3 .  Menaquinone-7 (VITAMIN K2 PO), Take 1 tablet by mouth daily., Disp: , Rfl:  .  metroNIDAZOLE (FLAGYL) 500 MG tablet, Take 500 mg by mouth 3 (three) times daily., Disp: , Rfl:  .  milk thistle 175 MG tablet, Take 175 mg by mouth daily., Disp: ,  Rfl:  .  Minocycline HCl (SOLODYN) 115 MG TB24, Take 115 mg by mouth every evening., Disp: , Rfl:  .  Multiple Vitamin (MULTIVITAMIN) tablet, Take 2 tablets by mouth daily., Disp: , Rfl:  .  MYRBETRIQ 50 MG TB24 tablet, TAKE 1  TABLET BY MOUTH EVERY DAY, Disp: 90 tablet, Rfl: 3 .  NIACIN CR PO, Take 2 tablets by mouth every evening. , Disp: , Rfl:  .  nitrofurantoin (MACRODANTIN) 100 MG capsule, Take 100 mg by mouth at bedtime. , Disp: , Rfl:  .  Nutritional Supplements (DHEA PO), Take 1 tablet by mouth daily., Disp: , Rfl:  .  Omega-3 Fatty Acids (OMEGA-3 FISH OIL PO), Take 1,400 mg by mouth 2 (two) times daily. , Disp: , Rfl:  .  OVER THE COUNTER MEDICATION, Take 1 tablet by mouth daily. Pregnenolone, Disp: , Rfl:  .  OVER THE COUNTER MEDICATION, daily. [place], Disp: , Rfl:  .  OVER THE COUNTER MEDICATION, daily. Non GMO Lecithin, Disp: , Rfl:  .  oxyCODONE (OXY IR/ROXICODONE) 5 MG immediate release tablet, Take 1-2 tablets (5-10 mg total) by mouth every 4 (four) hours as needed (pain)., Disp: 70 tablet, Rfl: 0 .  pravastatin (PRAVACHOL) 20 MG tablet, TAKE 1 TABLET (20 MG TOTAL) BY MOUTH EVERY EVENING. (Patient taking differently: TAKE 0.5 TABLET (10 MG TOTAL) BY MOUTH EVERY EVENING.), Disp: 90 tablet, Rfl: 0 .  Probiotic Product (PROBIOTIC DAILY PO), Take 2 tablets by mouth daily., Disp: , Rfl:  .  RESVERATROL PO, Take 1 tablet by mouth daily., Disp: , Rfl:  .  thyroid (ARMOUR) 120 MG tablet, Take 120 mg by mouth daily before breakfast., Disp: , Rfl:  .  TRULANCE 3 MG TABS, TAKE 1 TABLET BY MOUTH DAILY., Disp: 90 tablet, Rfl: 1 .  TURMERIC PO, Take 1 tablet by mouth daily., Disp: , Rfl:  .  Flaxseed, Linseed, (FLAX PO), Take 1 tablet by mouth daily., Disp: , Rfl:  .  KRILL OIL PO, Take 1 tablet by mouth daily., Disp: , Rfl:  .  montelukast (SINGULAIR) 10 MG tablet, TAKE 1 TABLET BY MOUTH EVERYDAY AT BEDTIME (Patient not taking: Reported on 06/20/2017), Disp: 90 tablet, Rfl: 1 .  thyroid  (ARMOUR) 30 MG tablet, Take 30 mg by mouth daily before breakfast., Disp: , Rfl:   Review of Systems  Review of Systems  Constitutional: Negative for fever, chills, weight loss, malaise/fatigue and diaphoresis.  HENT: Negative for hearing loss, ear pain, nosebleeds, congestion, sore throat, neck pain, tinnitus and ear discharge.   Eyes: Negative for blurred vision, double vision, photophobia, pain, discharge and redness.  Respiratory: Negative for cough, hemoptysis, sputum production, shortness of breath, wheezing and stridor.   Cardiovascular: Negative for chest pain, palpitations, orthopnea, claudication, leg swelling and PND.  Gastrointestinal: negative for abdominal pain. Negative for heartburn, nausea, vomiting, diarrhea, constipation, blood in stool and melena.  Genitourinary: Negative for dysuria, urgency, frequency, hematuria and flank pain.  Musculoskeletal: Negative for myalgias, back pain, joint pain and falls.  Skin: Negative for itching and rash.  Neurological: Negative for dizziness, tingling, tremors, sensory change, speech change, focal weakness, seizures, loss of consciousness, weakness and headaches.  Endo/Heme/Allergies: Negative for environmental allergies and polydipsia. Does not bruise/bleed easily.  Psychiatric/Behavioral: Negative for depression, suicidal ideas, hallucinations, memory loss and substance abuse. The patient is not nervous/anxious and does not have insomnia.        Objective:  Blood pressure (!) 142/95, pulse 79, height 5' 5"  (1.651 m), weight 189 lb (85.7 kg).   Physical Exam  Vitals reviewed. Constitutional: She is oriented to person, place, and time. She appears well-developed and well-nourished.  HENT:  Head: Normocephalic and atraumatic.        Right Ear: External ear normal.  Left Ear:  External ear normal.  Nose: Nose normal.  Mouth/Throat: Oropharynx is clear and moist.  Eyes: Conjunctivae and EOM are normal. Pupils are equal, round, and  reactive to light. Right eye exhibits no discharge. Left eye exhibits no discharge. No scleral icterus.  Neck: Normal range of motion. Neck supple. No tracheal deviation present. No thyromegaly present.  Cardiovascular: Normal rate, regular rhythm, normal heart sounds and intact distal pulses.  Exam reveals no gallop and no friction rub.   No murmur heard. Respiratory: Effort normal and breath sounds normal. No respiratory distress. She has no wheezes. She has no rales. She exhibits no tenderness.  GI: Soft. Bowel sounds are normal. She exhibits no distension and no mass. There is no tenderness. There is no rebound and no guarding.  Genitourinary:  Breasts: Small ~1cm firm mass L breast at 7'o clock near scarline.no skin changes or nipple changes bilaterally      Vulva is normal without lesions Vagina is pink moist without discharge, good rugae Cervix absent Uterus is absent Adnexa is negative. Musculoskeletal: Normal range of motion. She exhibits no edema and no tenderness.  Neurological: She is alert and oriented to person, place, and time. She has normal reflexes. She displays normal reflexes. No cranial nerve deficit. She exhibits normal muscle tone. Coordination normal.  Skin: Skin is warm and dry. No rash noted. No erythema. No pallor.  Psychiatric: She has a normal mood and affect. Her behavior is normal. Judgment and thought content normal.   Microscopic wet-mount exam shows negative for pathogens, normal epithelial cells.      Medications Ordered at today's visit: Meds ordered this encounter  Medications  . estradiol (ESTRACE) 1 MG tablet    Sig: Take 1 tablet (1 mg total) by mouth daily.    Dispense:  90 tablet    Refill:  3  . mirabegron ER (MYRBETRIQ) 50 MG TB24 tablet    Sig: Take 1 tablet (50 mg total) by mouth daily.    Dispense:  90 tablet    Refill:  3  . estradiol (ESTRACE) 0.1 MG/GM vaginal cream    Sig: Use 1 grams every other night    Dispense:  42.5 g     Refill:  12      Assessment:    Healthy female exam.   Small L breast lump    Plan:    Hormone replacement therapy: estrace as per above. Due for mammogram   Return in about 1 year (around 06/20/2018) for yearly, with Dr Elonda Husky.  Bufford Lope, DO PGY-2, [facility] 06/20/2017 12:17 PM

## 2017-07-13 DIAGNOSIS — E739 Lactose intolerance, unspecified: Secondary | ICD-10-CM | POA: Insufficient documentation

## 2017-07-13 DIAGNOSIS — E039 Hypothyroidism, unspecified: Secondary | ICD-10-CM | POA: Insufficient documentation

## 2017-07-13 DIAGNOSIS — K9041 Non-celiac gluten sensitivity: Secondary | ICD-10-CM | POA: Insufficient documentation

## 2017-07-13 DIAGNOSIS — F4321 Adjustment disorder with depressed mood: Secondary | ICD-10-CM | POA: Insufficient documentation

## 2017-08-11 ENCOUNTER — Other Ambulatory Visit: Payer: Self-pay | Admitting: Obstetrics & Gynecology

## 2017-08-11 ENCOUNTER — Telehealth: Payer: Self-pay | Admitting: *Deleted

## 2017-08-11 MED ORDER — ESTRADIOL 0.1 MG/GM VA CREA: TOPICAL_CREAM | VAGINAL | 3 refills | Status: DC

## 2017-08-11 NOTE — Telephone Encounter (Signed)
3 tubes were prescribed

## 2017-08-11 NOTE — Telephone Encounter (Signed)
90 day supply requested for estradiol cream.

## 2017-08-29 ENCOUNTER — Encounter: Payer: Self-pay | Admitting: Family Medicine

## 2017-08-30 ENCOUNTER — Telehealth: Payer: Self-pay

## 2017-08-30 NOTE — Telephone Encounter (Signed)
Patient is requesting a copy of the mammogram report from 06/23/2017. Please mail her a copy.

## 2017-09-03 ENCOUNTER — Other Ambulatory Visit: Payer: Self-pay | Admitting: Family Medicine

## 2017-09-04 NOTE — Telephone Encounter (Signed)
May have this +2 refills

## 2017-10-04 ENCOUNTER — Encounter: Payer: Self-pay | Admitting: Family Medicine

## 2017-10-04 ENCOUNTER — Ambulatory Visit: Payer: BLUE CROSS/BLUE SHIELD | Admitting: Family Medicine

## 2017-10-04 VITALS — BP 118/80

## 2017-10-04 DIAGNOSIS — H10021 Other mucopurulent conjunctivitis, right eye: Secondary | ICD-10-CM

## 2017-10-04 DIAGNOSIS — B001 Herpesviral vesicular dermatitis: Secondary | ICD-10-CM

## 2017-10-04 MED ORDER — GENTAMICIN SULFATE 0.3 % OP SOLN: OPHTHALMIC | 0 refills | Status: DC

## 2017-10-04 MED ORDER — VALACYCLOVIR HCL 1 G PO TABS: ORAL_TABLET | ORAL | 6 refills | Status: DC

## 2017-10-04 NOTE — Progress Notes (Signed)
   Subjective:    Patient ID: [name], female    DOB: 10-Feb-1971, 47 y.o.   MRN: [id number]  HPIright eye itching, burning and watering. Started a couple days ago. Has not tried any treatments.   Denies.  Eyes irritated.  Discussed in nature.  Of No loss of vision.  No nasal congestion.  Around else with nonproductive eyes Would like a cream for fever blister.  Has recurrent fever blisters.  Frustrated by this.  Has started to get one again.   Review of Systems No headache, no major weight loss or weight gain, no chest pain no back pain abdominal pain no change in bowel habits complete ROS otherwise negative     Objective:   Physical Exam  Alert vitals stable, NAD. Blood pressure good on repeat. HEENT normal. Lungs clear. Heart regular rate and rhythm. Right eye crusty irritated inflamed sclera injected.  But positive herpes labialis      Assessment & Plan:  Impression 1 active fever blister discussed patient would like to try oral Valtrex side effects benefits discussed  2.  Isolated conjunctivitis viral versus bacterial discussed.  Will cover with antibiotics symptom care discussed warning signs discussed

## 2017-11-11 IMAGING — RF DG C-ARM 61-120 MIN
1 series · 2 of 2 positions shown · non-contrast
Comparison: Plain film cervical spine from [HOSPITAL] [REDACTED] 06/10/2016. CT cervical spine 03/25/2016.

CLINICAL DATA: C3-4 posterior fusion.

EXAM:
DG C-ARM 61-120 MIN; CERVICAL SPINE - 2-3 VIEW

[Series 1: run · 2 of 2 slices shown]
[im 1/2]
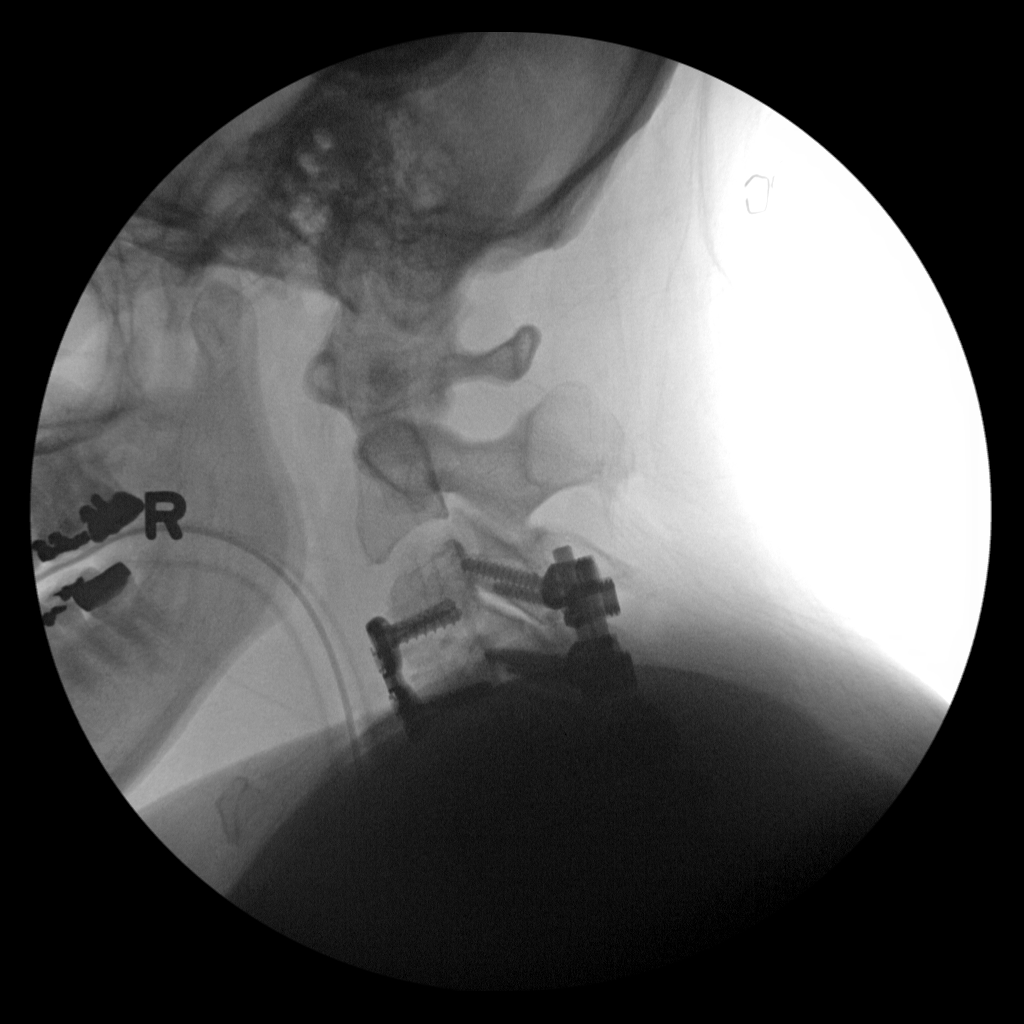
[im 2/2]
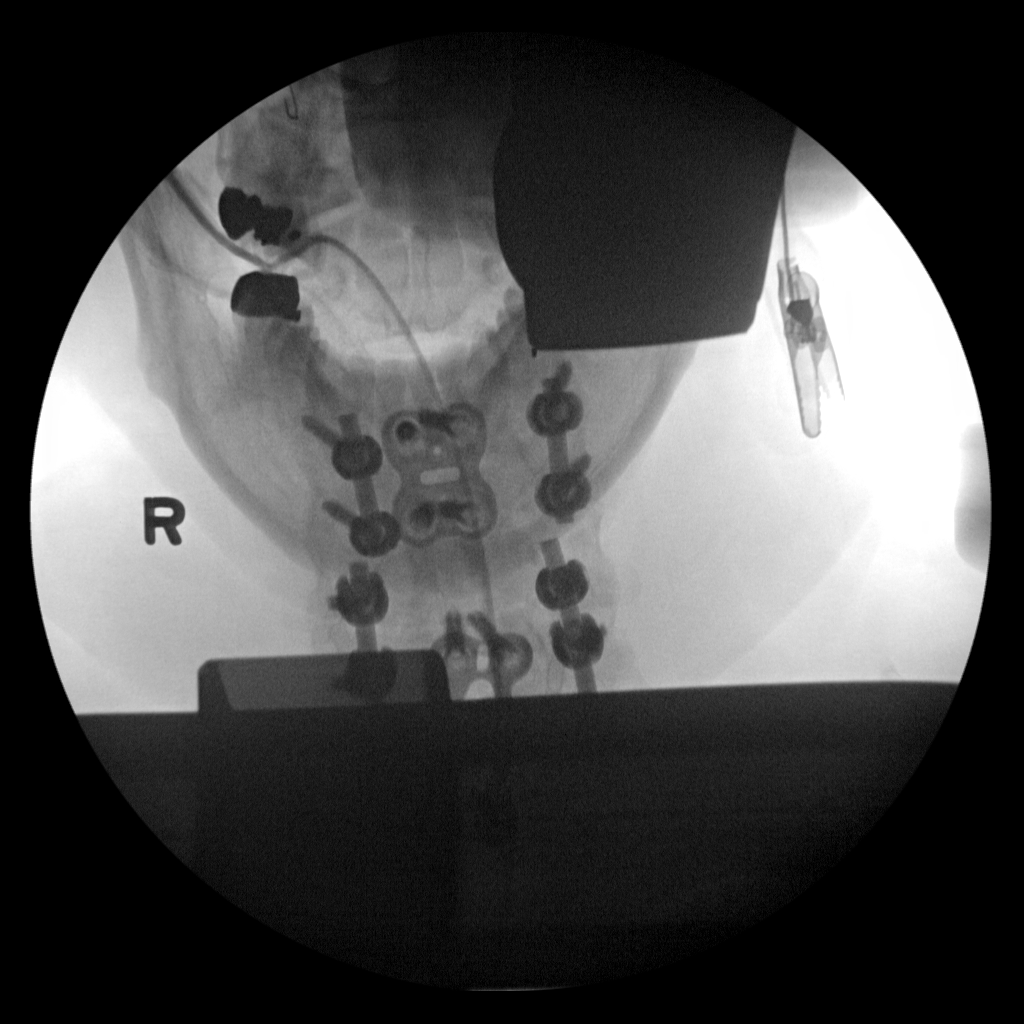

[2 of 2 positions shown; findings below may reference images not displayed]

FINDINGS: New posterior fusion hardware is in place at C3-4. Postsurgical
changes of C3-4 ACDF and C5-7 ACDF and posterior fusion are
identified as on the prior exams. No acute abnormality.
IMPRESSION: Intraoperative imaging demonstrating posterior C3-4 fusion. No acute
finding.

## 2017-11-15 ENCOUNTER — Other Ambulatory Visit: Payer: Self-pay | Admitting: Family Medicine

## 2017-12-22 ENCOUNTER — Ambulatory Visit: Payer: BLUE CROSS/BLUE SHIELD | Admitting: Family Medicine

## 2017-12-22 ENCOUNTER — Encounter: Payer: Self-pay | Admitting: Family Medicine

## 2017-12-22 VITALS — Temp 97.9°F | Ht 64.5 in | Wt 182.0 lb

## 2017-12-22 DIAGNOSIS — I10 Essential (primary) hypertension: Secondary | ICD-10-CM

## 2017-12-22 MED ORDER — LISINOPRIL 20 MG PO TABS: 20.0000 mg | ORAL_TABLET | Freq: Every day | ORAL | 3 refills | Status: DC

## 2017-12-22 MED ORDER — HYDROCHLOROTHIAZIDE 25 MG PO TABS: 25.0000 mg | ORAL_TABLET | Freq: Every day | ORAL | 4 refills | Status: DC

## 2017-12-22 MED ORDER — CEFPROZIL 500 MG PO TABS: 500.0000 mg | ORAL_TABLET | Freq: Two times a day (BID) | ORAL | 0 refills | Status: DC

## 2017-12-22 MED ORDER — ALBUTEROL SULFATE HFA 108 (90 BASE) MCG/ACT IN AERS: 2.0000 | INHALATION_SPRAY | Freq: Four times a day (QID) | RESPIRATORY_TRACT | 2 refills | Status: DC | PRN

## 2017-12-22 NOTE — Progress Notes (Signed)
   Subjective:    Patient ID: [name], female    DOB: 07-Jan-1971, 47 y.o.   MRN: [id number]  HPI Patient is here today to follow up on her blood pressure.She states she also started having some sinus congestion. She is taking mucinex dm . Orthostatics done. Patient relates feeling rundown head congestion drainage coughing chest congestion coughing also in addition to this bronchitis issues and also significant blood pressure issues she brings in her machine we checked it with her machine we also checked it with our cuff fairly close it should be noted that with our cuff bottom numbers are much more accurate whereas with her cuff her top number is moderately elevated above what we get Review of Systems  Constitutional: Positive for appetite change and fatigue. Negative for activity change and fever.  HENT: Positive for congestion and rhinorrhea. Negative for ear pain.   Eyes: Negative for discharge.  Respiratory: Positive for cough. Negative for shortness of breath and wheezing.   Cardiovascular: Negative for chest pain.  Gastrointestinal: Negative for abdominal pain, diarrhea and nausea.  Neurological: Positive for light-headedness and headaches.       Objective:   Physical Exam  Constitutional: She appears well-developed.  HENT:  Head: Normocephalic.  Nose: Nose normal.  Mouth/Throat: Oropharynx is clear and moist. No oropharyngeal exudate.  Eyes: Right eye exhibits no discharge. Left eye exhibits no discharge.  Neck: Neck supple.  Cardiovascular: Normal rate and normal heart sounds.  No murmur heard. Pulmonary/Chest: Effort normal and breath sounds normal. She has no wheezes. She has no rales.  Lymphadenopathy:    She has no cervical adenopathy.  Skin: Skin is warm and dry.  Nursing note and vitals reviewed.         Assessment & Plan:  Bronchitis 1 more round of antibiotic purulent sputum likely that could take several weeks for this recent chest x-ray looks good  recent ER visit urgent care visit and reviewed  Sinusitis antibiotics should help  Allergies use allergy medicines  HTN subpar control medication follow-up in 6 weeks time for recheck follow-up sooner problems

## 2017-12-22 NOTE — Patient Instructions (Addendum)
DASH Eating Plan DASH stands for "Dietary Approaches to Stop Hypertension." The DASH eating plan is a healthy eating plan that has been shown to reduce high blood pressure (hypertension). It may also reduce your risk for type 2 diabetes, heart disease, and stroke. The DASH eating plan may also help with weight loss. What are tips for following this plan? General guidelines  Avoid eating more than 2,300 mg (milligrams) of salt (sodium) a day. If you have hypertension, you may need to reduce your sodium intake to 1,500 mg a day.  Limit alcohol intake to no more than 1 drink a day for nonpregnant women and 2 drinks a day for men. One drink equals 12 oz of beer, 5 oz of wine, or 1 oz of hard liquor.  Work with your health care provider to maintain a healthy body weight or to lose weight. Ask what an ideal weight is for you.  Get at least 30 minutes of exercise that causes your heart to beat faster (aerobic exercise) most days of the week. Activities may include walking, swimming, or biking.  Work with your health care provider or diet and nutrition specialist (dietitian) to adjust your eating plan to your individual calorie needs. Reading food labels  Check food labels for the amount of sodium per serving. Choose foods with less than 5 percent of the Daily Value of sodium. Generally, foods with less than 300 mg of sodium per serving fit into this eating plan.  To find whole grains, look for the word "whole" as the first word in the ingredient list. Shopping  Buy products labeled as "low-sodium" or "no salt added."  Buy fresh foods. Avoid canned foods and premade or frozen meals. Cooking  Avoid adding salt when cooking. Use salt-free seasonings or herbs instead of table salt or sea salt. Check with your health care provider or pharmacist before using salt substitutes.  Do not fry foods. Cook foods using healthy methods such as baking, boiling, grilling, and broiling instead.  Cook with  heart-healthy oils, such as olive, canola, soybean, or sunflower oil. Meal planning   Eat a balanced diet that includes: ? 5 or more servings of fruits and vegetables each day. At each meal, try to fill half of your plate with fruits and vegetables. ? Up to 6-8 servings of whole grains each day. ? Less than 6 oz of lean meat, poultry, or fish each day. A 3-oz serving of meat is about the same size as a deck of cards. One egg equals 1 oz. ? 2 servings of low-fat dairy each day. ? A serving of nuts, seeds, or beans 5 times each week. ? Heart-healthy fats. Healthy fats called Omega-3 fatty acids are found in foods such as flaxseeds and coldwater fish, like sardines, salmon, and mackerel.  Limit how much you eat of the following: ? Canned or prepackaged foods. ? Food that is high in trans fat, such as fried foods. ? Food that is high in saturated fat, such as fatty meat. ? Sweets, desserts, sugary drinks, and other foods with added sugar. ? Full-fat dairy products.  Do not salt foods before eating.  Try to eat at least 2 vegetarian meals each week.  Eat more home-cooked food and less restaurant, buffet, and fast food.  When eating at a restaurant, ask that your food be prepared with less salt or no salt, if possible. What foods are recommended? The items listed may not be a complete list. Talk with your dietitian about what   dietary choices are best for you. Grains Whole-grain or whole-wheat bread. Whole-grain or whole-wheat pasta. Brown rice. Oatmeal. Quinoa. Bulgur. Whole-grain and low-sodium cereals. Pita bread. Low-fat, low-sodium crackers. Whole-wheat flour tortillas. Vegetables Fresh or frozen vegetables (raw, steamed, roasted, or grilled). Low-sodium or reduced-sodium tomato and vegetable juice. Low-sodium or reduced-sodium tomato sauce and tomato paste. Low-sodium or reduced-sodium canned vegetables. Fruits All fresh, dried, or frozen fruit. Canned fruit in natural juice (without  added sugar). Meat and other protein foods Skinless chicken or turkey. Ground chicken or turkey. Pork with fat trimmed off. Fish and seafood. Egg whites. Dried beans, peas, or lentils. Unsalted nuts, nut butters, and seeds. Unsalted canned beans. Lean cuts of beef with fat trimmed off. Low-sodium, lean deli meat. Dairy Low-fat (1%) or fat-free (skim) milk. Fat-free, low-fat, or reduced-fat cheeses. Nonfat, low-sodium ricotta or cottage cheese. Low-fat or nonfat yogurt. Low-fat, low-sodium cheese. Fats and oils Soft margarine without trans fats. Vegetable oil. Low-fat, reduced-fat, or light mayonnaise and salad dressings (reduced-sodium). Canola, safflower, olive, soybean, and sunflower oils. Avocado. Seasoning and other foods Herbs. Spices. Seasoning mixes without salt. Unsalted popcorn and pretzels. Fat-free sweets. What foods are not recommended? The items listed may not be a complete list. Talk with your dietitian about what dietary choices are best for you. Grains Baked goods made with fat, such as croissants, muffins, or some breads. Dry pasta or rice meal packs. Vegetables Creamed or fried vegetables. Vegetables in a cheese sauce. Regular canned vegetables (not low-sodium or reduced-sodium). Regular canned tomato sauce and paste (not low-sodium or reduced-sodium). Regular tomato and vegetable juice (not low-sodium or reduced-sodium). Pickles. Olives. Fruits Canned fruit in a light or heavy syrup. Fried fruit. Fruit in cream or butter sauce. Meat and other protein foods Fatty cuts of meat. Ribs. Fried meat. Bacon. Sausage. Bologna and other processed lunch meats. Salami. Fatback. Hotdogs. Bratwurst. Salted nuts and seeds. Canned beans with added salt. Canned or smoked fish. Whole eggs or egg yolks. Chicken or turkey with skin. Dairy Whole or 2% milk, cream, and half-and-half. Whole or full-fat cream cheese. Whole-fat or sweetened yogurt. Full-fat cheese. Nondairy creamers. Whipped toppings.  Processed cheese and cheese spreads. Fats and oils Butter. Stick margarine. Lard. Shortening. Ghee. Bacon fat. Tropical oils, such as coconut, palm kernel, or palm oil. Seasoning and other foods Salted popcorn and pretzels. Onion salt, garlic salt, seasoned salt, table salt, and sea salt. Worcestershire sauce. Tartar sauce. Barbecue sauce. Teriyaki sauce. Soy sauce, including reduced-sodium. Steak sauce. Canned and packaged gravies. Fish sauce. Oyster sauce. Cocktail sauce. Horseradish that you find on the shelf. Ketchup. Mustard. Meat flavorings and tenderizers. Bouillon cubes. Hot sauce and Tabasco sauce. Premade or packaged marinades. Premade or packaged taco seasonings. Relishes. Regular salad dressings. Where to find more information:  National Heart, Lung, and Blood Institute: www.nhlbi.nih.gov  American Heart Association: www.heart.org Summary  The DASH eating plan is a healthy eating plan that has been shown to reduce high blood pressure (hypertension). It may also reduce your risk for type 2 diabetes, heart disease, and stroke.  With the DASH eating plan, you should limit salt (sodium) intake to 2,300 mg a day. If you have hypertension, you may need to reduce your sodium intake to 1,500 mg a day.  When on the DASH eating plan, aim to eat more fresh fruits and vegetables, whole grains, lean proteins, low-fat dairy, and heart-healthy fats.  Work with your health care provider or diet and nutrition specialist (dietitian) to adjust your eating plan to your individual   calorie needs. This information is not intended to replace advice given to you by your health care provider. Make sure you discuss any questions you have with your health care provider. Document Released: 06/30/2011 Document Revised: 07/04/2016 Document Reviewed: 07/04/2016 Elsevier Interactive Patient Education  2018 [employer]  

## 2018-01-08 IMAGING — US US BIOPSY
1 series · 9 of 9 positions shown · non-contrast
Comparison: none

INDICATION: 45-YEAR-OLD FEMALE WITH CONCERN FOR NASH

[Series 1: us biopsy · 0.26mm/px · 9 of 9 slices shown]
[im 1/9]
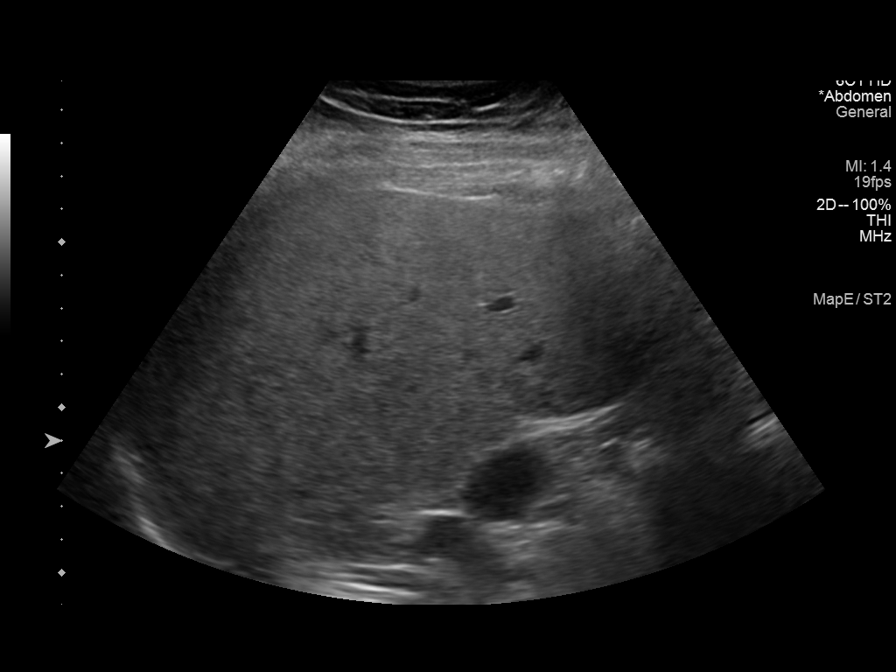
[im 2/9]
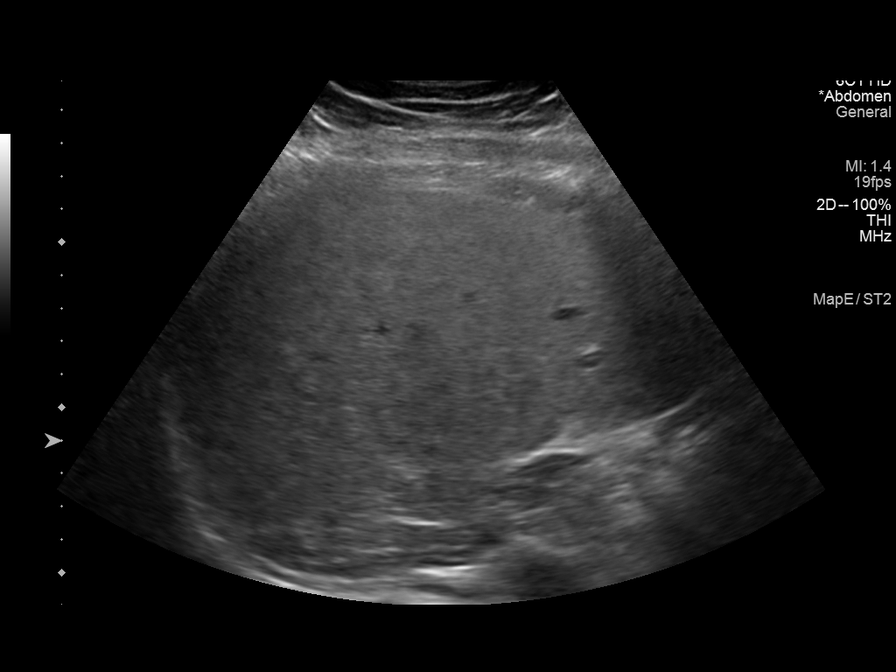
[im 3/9]
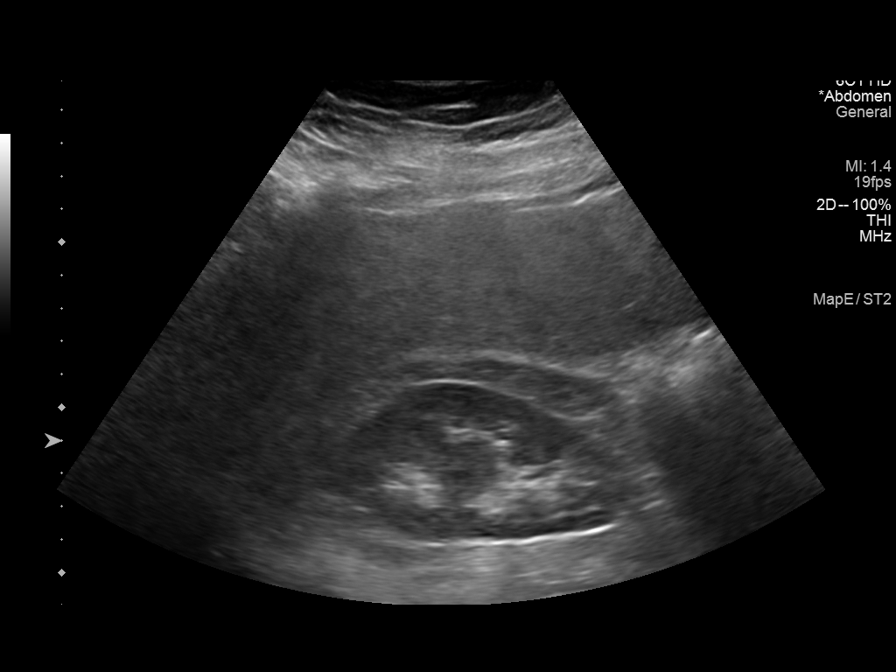
[im 4/9]
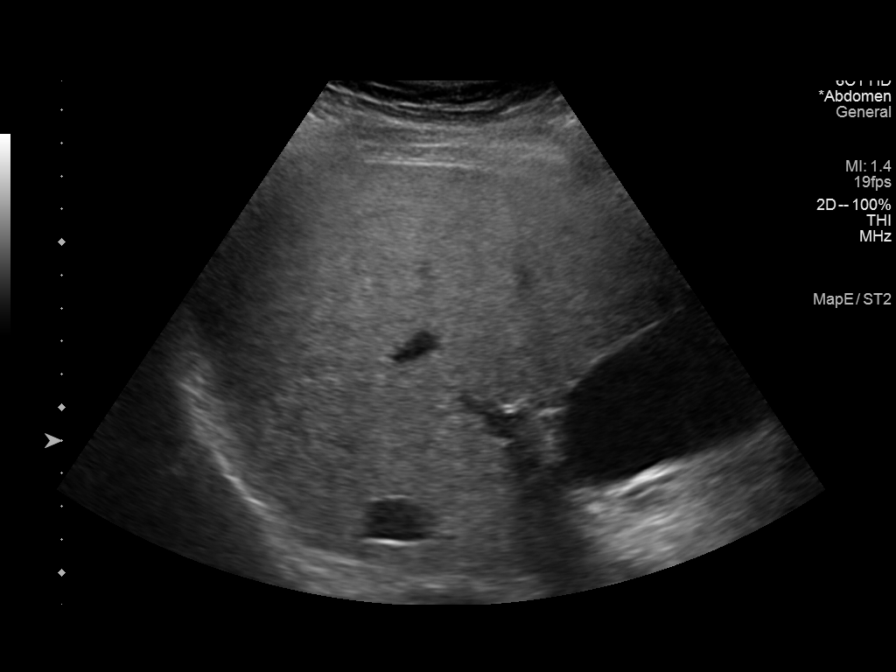
[im 5/9]
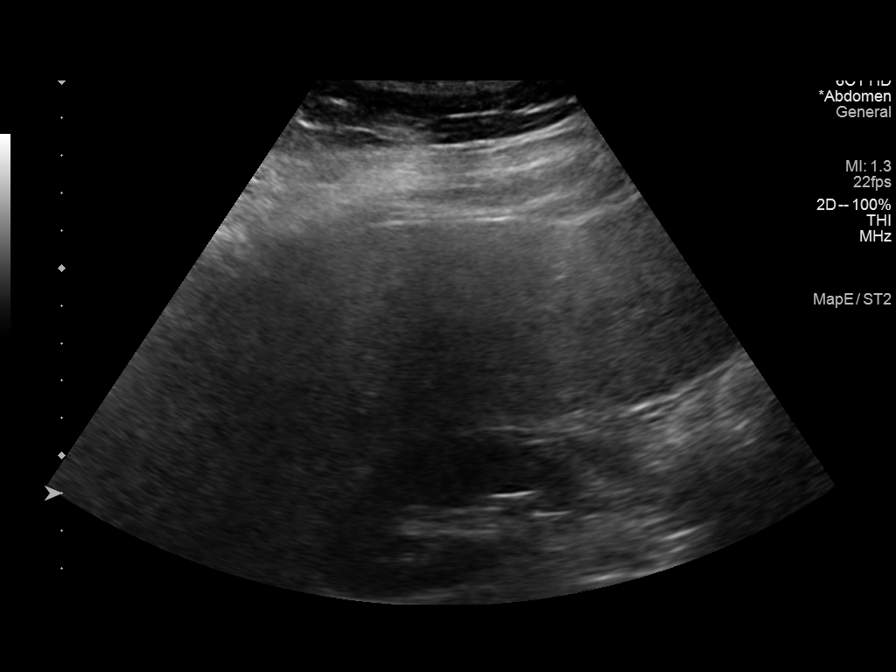
[im 6/9]
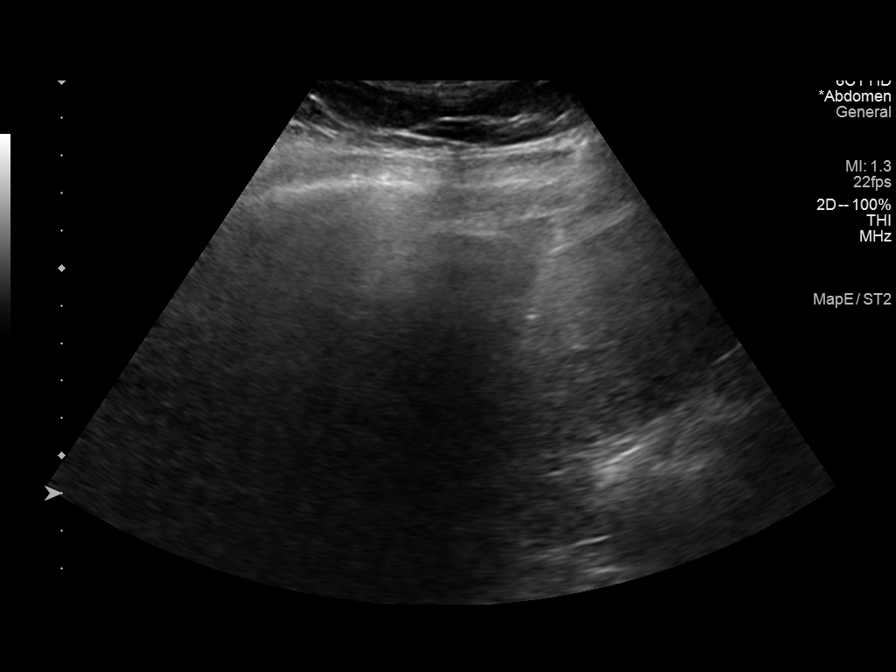
[im 7/9]
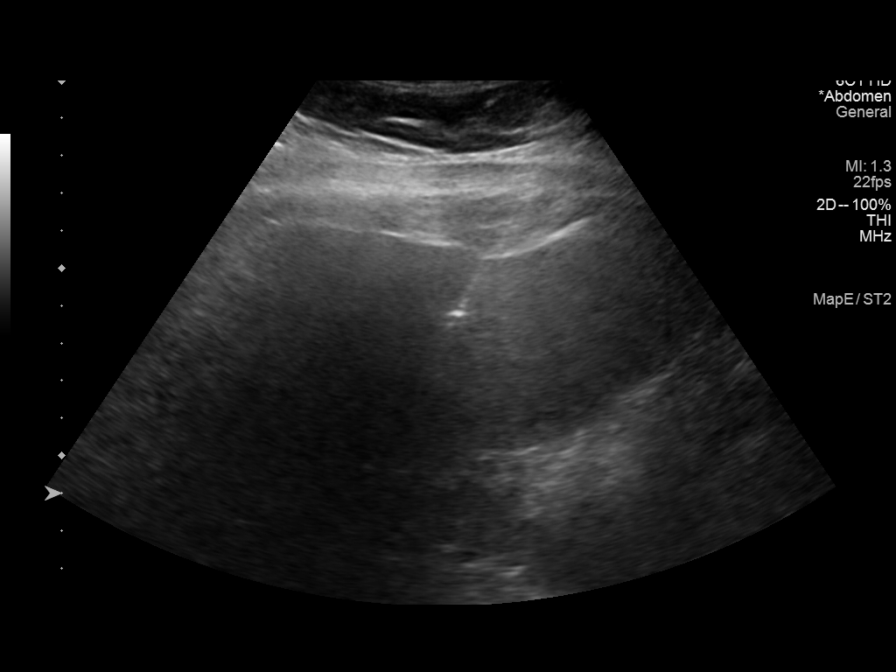
[im 8/9]
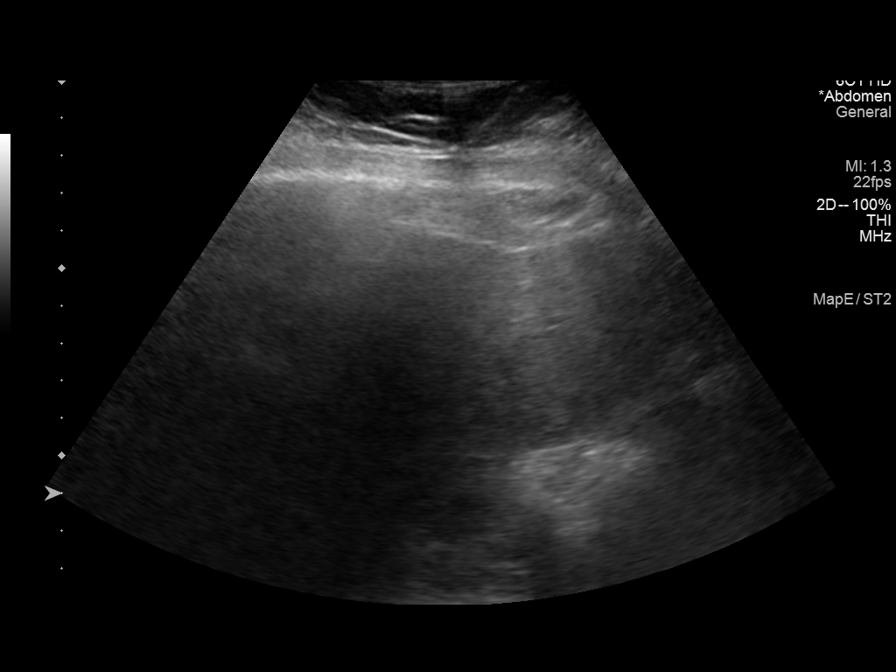
[im 9/9]
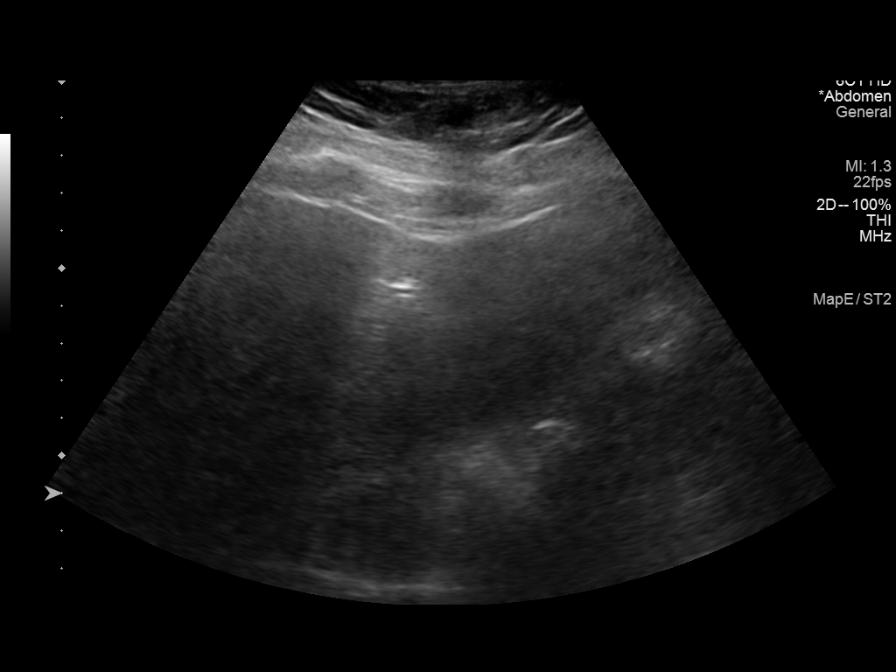

[9 of 9 positions shown; findings below may reference images not displayed]

EXAM:
ULTRASOUND GUIDED LIVER BIOPSY FOR MEDICAL LIVER DISEASE

MEDICATIONS:
None.

ANESTHESIA/SEDATION:
Moderate (conscious) sedation was employed during this procedure. A
total of Versed 3.0 mg and Fentanyl 50 mcg was administered
intravenously.

Moderate Sedation Time: 10 minutes. The patient's level of
consciousness and vital signs were monitored continuously by
radiology nursing throughout the procedure under my direct
supervision.

FLUOROSCOPY TIME:  None

COMPLICATIONS:
None

PROCEDURE:
The procedure, risks, benefits, and alternatives were explained to
the patient. Questions regarding the procedure were encouraged and
answered. The patient understands and consents to the procedure.

Ultrasound survey of the right liver lobe performed with images
stored and sent to PACs.

The right lower thorax/right upper abdomen was prepped with Betadine
in a sterile fashion, and a sterile drape was applied covering the
operative field. A sterile gown and sterile gloves were used for the
procedure. Local anesthesia was provided with 1% Lidocaine.

Once the patient is prepped and draped sterilely and the skin and
subcutaneous tissues were generously infiltrated with 1% lidocaine,
a small stab incision was made with an 11 blade scalpel.

A 17 gauge introducer needle was advanced under ultrasound guidance
in an intercostal location into the right liver lobe. The stylet was
removed, and 3 separate 18 gauge core biopsy were retrieved. Samples
were placed into formalin for transportation to the lab.

Three separate Gel-Foam pledgets were then infused with a small
amount of saline for assistance with hemostasis.

The needle was removed, and a final ultrasound image was performed.

The patient tolerated the procedure well and remained
hemodynamically stable throughout.

No complications were encountered and no significant blood loss was
encounter.
IMPRESSION: Status post ultrasound-guided biopsy of right liver lobe for medical
liver purpose. Tissue specimen sent to pathology for complete
histopathologic analysis.

## 2018-01-15 IMAGING — CT CT ABDOMEN W/O CM
2 of 4 series · 16 of 46 positions shown, 18 images · non-contrast
Comparison: None.

CLINICAL DATA: Persistent upper abdominal pain following recent
liver biopsy

EXAM:
CT ABDOMEN WITHOUT CONTRAST
TECHNIQUE: Multidetector CT imaging of the abdomen was performed following the
standard protocol without oral or intravenous contrast material
administration.

[Series 2: abd/ pelvis 5.0 i30f 1 · axial · 0.79mm/px · z∈[-942,-657]mm · 13 of 65 slices shown, 15 images]
[im 4/65  soft-tissue]
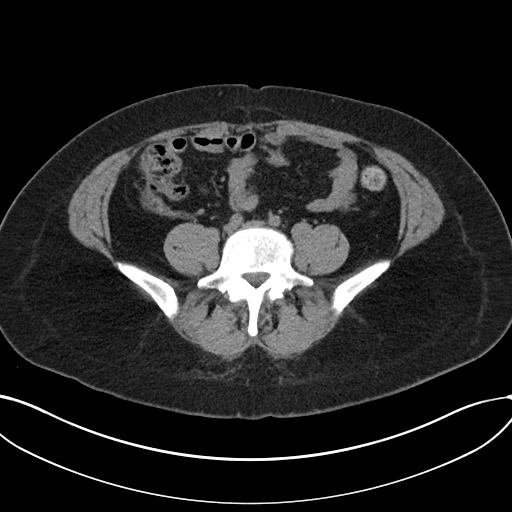
[im 4/65  bone]
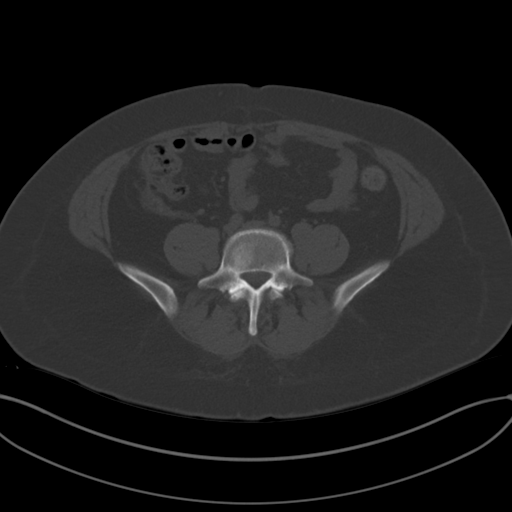
[im 10/65  soft-tissue]
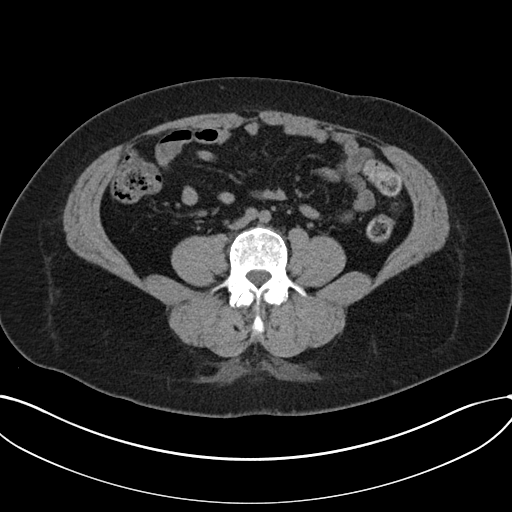
[im 13/65  soft-tissue]
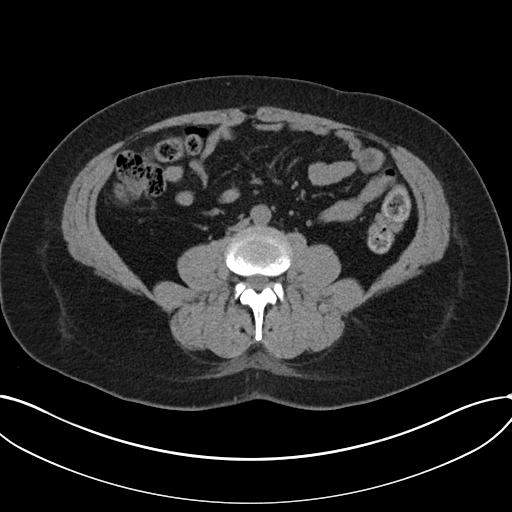
[im 19/65  soft-tissue]
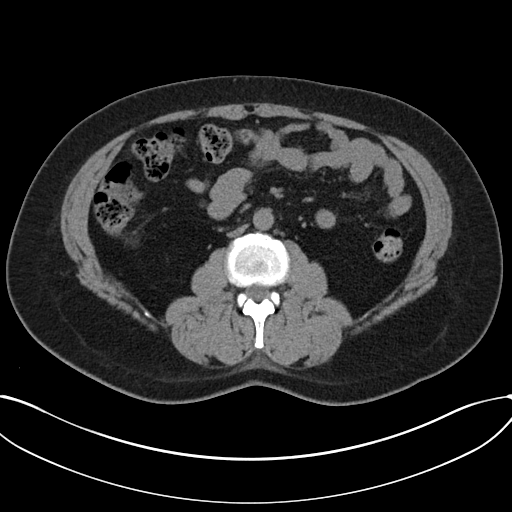
[im 22/65  soft-tissue]
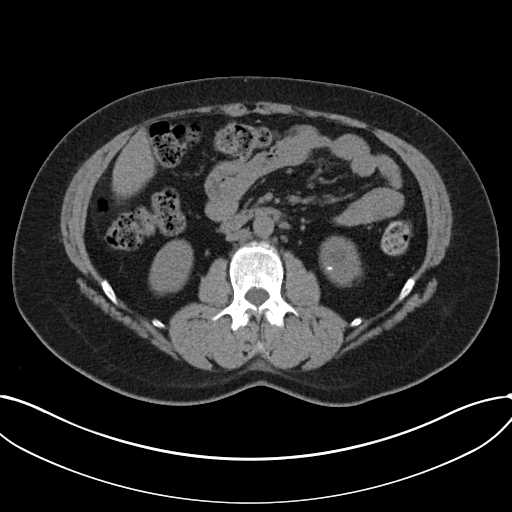
[im 28/65  soft-tissue]
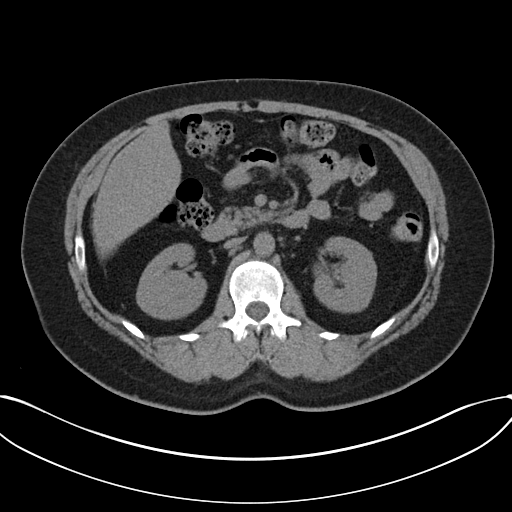
[im 34/65  soft-tissue]
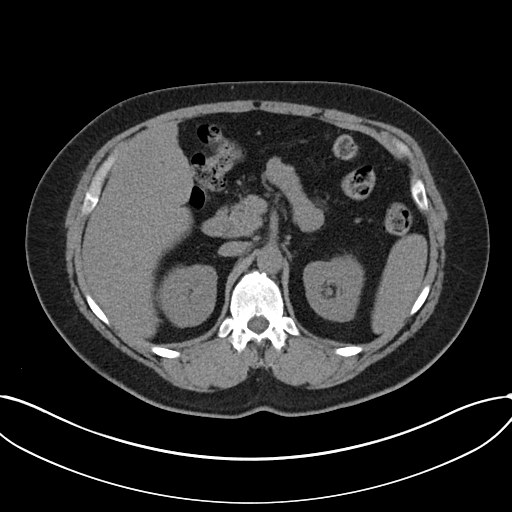
[im 37/65  soft-tissue]
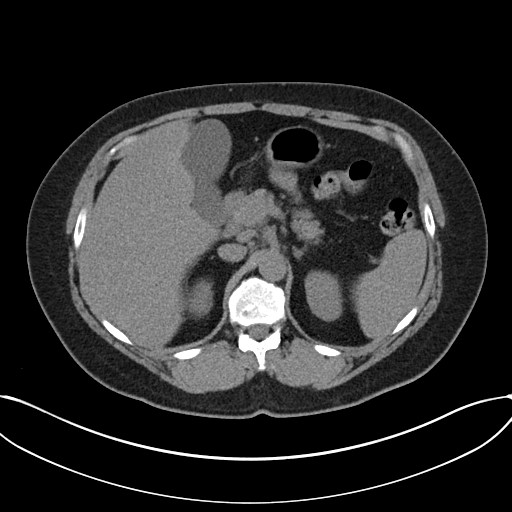
[im 43/65  soft-tissue]
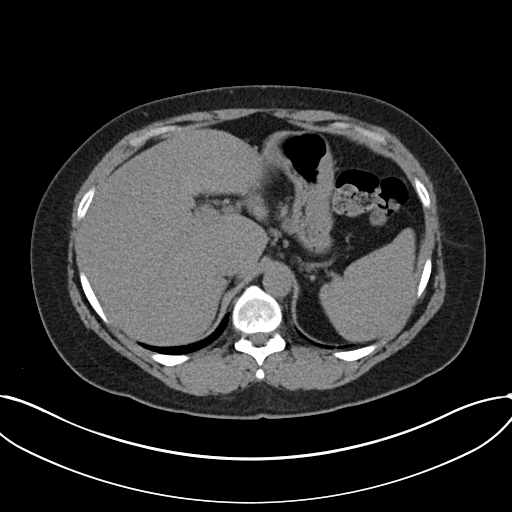
[im 43/65  bone]
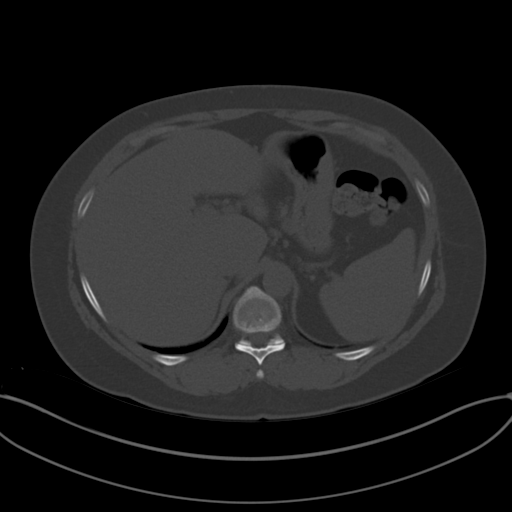
[im 46/65  soft-tissue]
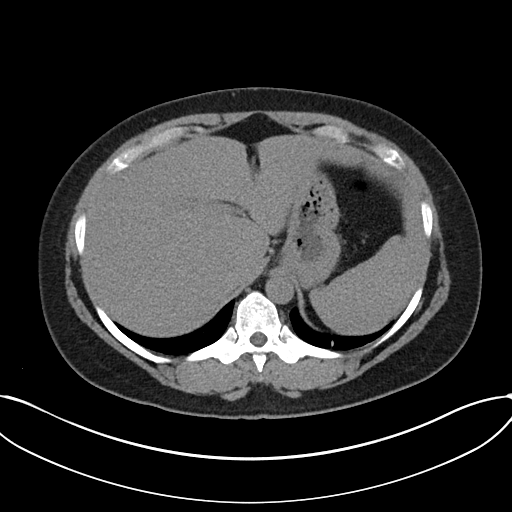
[im 52/65  soft-tissue]
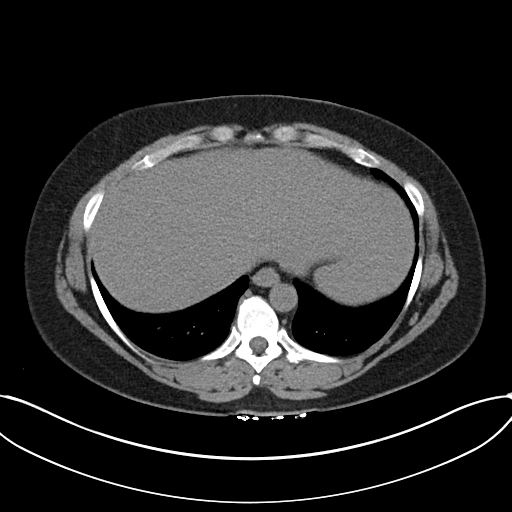
[im 55/65  soft-tissue]
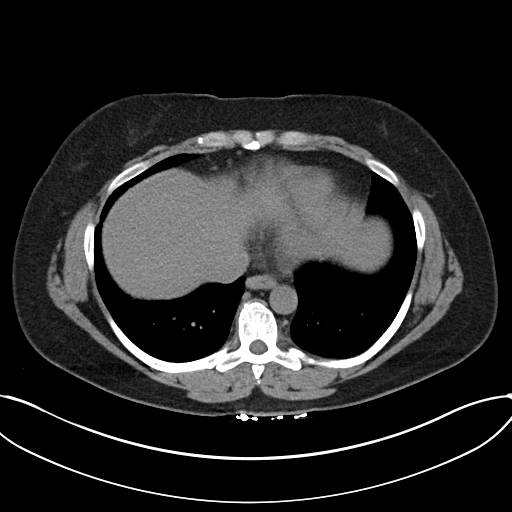
[im 61/65  soft-tissue]
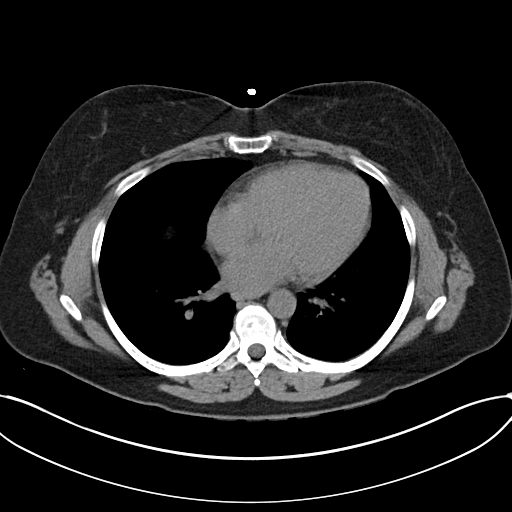

[Series 5: cor st · coronal · 0.65mm/px · 3 of 87 slices shown]
[im 29/87  soft-tissue]
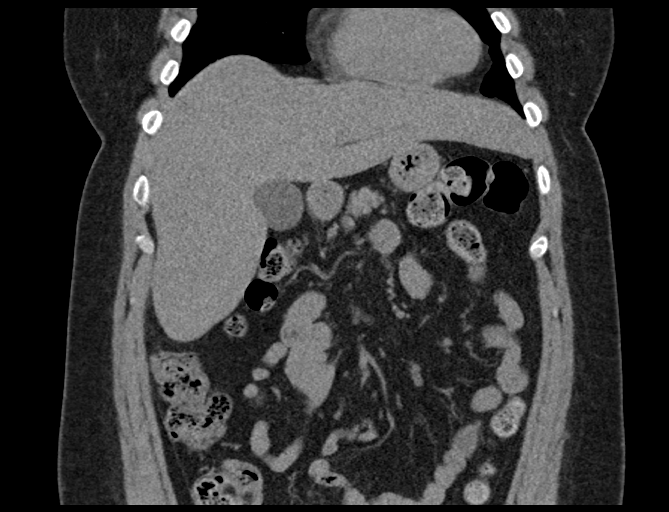
[im 39/87  soft-tissue]
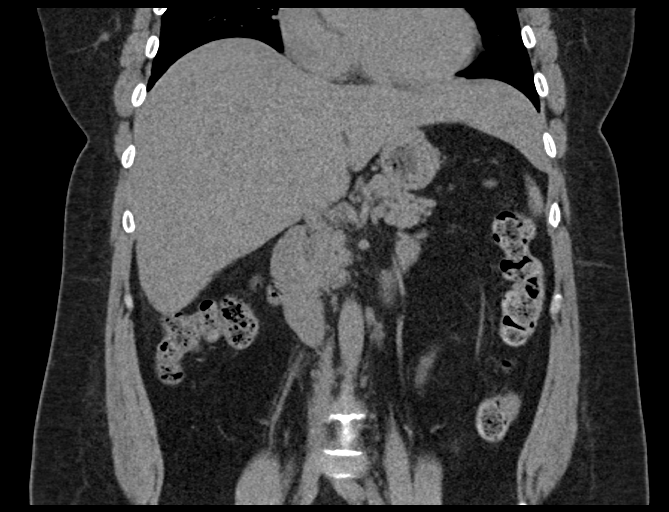
[im 48/87  soft-tissue]
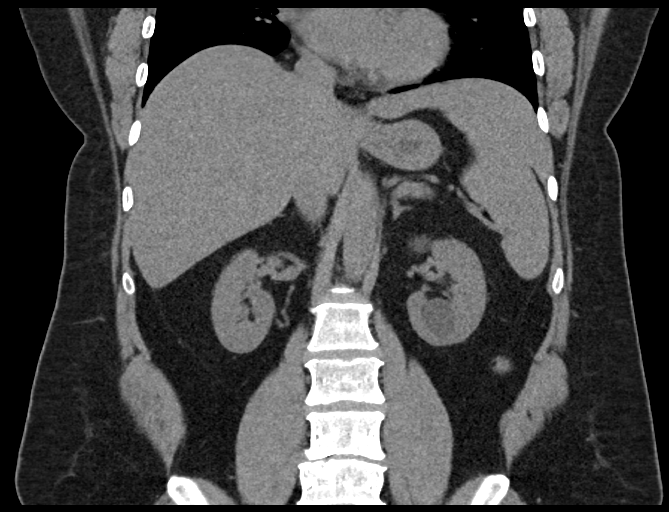

[16 of 46 positions shown; findings below may reference images not displayed]

FINDINGS: Lower chest: There is mild atelectasis in the posterior left lung
base. Lung bases elsewhere clear. No basilar pneumothorax.

Hepatobiliary: Liver measures 19.5 cm in length. No focal liver
lesions are evident on this noncontrast enhanced study. There is no
evidence of abnormal attenuation or hemorrhage within the liver on
this noncontrast enhanced study. There is no perihepatic fluid.
Gallbladder wall does appreciably thickened. There is no biliary
duct dilatation.

Pancreas: No pancreatic mass or inflammatory focus.

Spleen: No splenic lesions are evident.

Adrenals/Urinary Tract: Adrenals appear normal bilaterally. There is
a cyst in the mid left kidney measuring 1.9 x 1.5 cm. There is a
second cyst in the lower pole of the left kidney measuring 2.1 x
cm. No hydronephrosis on either side. There are several 1 mm calculi
in the lower pole left kidney. There is a 4 x 3 mm calculus in the
lower pole left kidney as well. There is no proximal ureteral
calculus on either side.

Stomach/Bowel: No bowel wall or mesenteric thickening. No bowel
obstruction. No free air or portal venous air.

Vascular/Lymphatic: No abdominal aortic aneurysm. No vascular
calcification noted in the aorta or proximal common iliac arteries.
No adenopathy evident in the abdomen or upper pelvis.

Other: No abscess or ascites evident.

Musculoskeletal: No blastic or lytic bone lesions. No abdominal wall
or intramuscular lesion.
IMPRESSION: Liver is prominent without focal lesion evident. In particular,
there is no evidence of hemorrhage or inflammatory appearing lesion
in the liver. No evidence of abscess. No perihepatic fluid.

Cysts in left kidney with several nonobstructing calculi in the
lower pole left kidney.

Study otherwise unremarkable.

## 2018-02-05 ENCOUNTER — Ambulatory Visit: Payer: BLUE CROSS/BLUE SHIELD | Admitting: Family Medicine

## 2018-02-05 ENCOUNTER — Encounter: Payer: Self-pay | Admitting: Family Medicine

## 2018-02-05 VITALS — BP 122/78 | Temp 98.3°F | Ht 64.5 in | Wt 186.0 lb

## 2018-02-05 DIAGNOSIS — I10 Essential (primary) hypertension: Secondary | ICD-10-CM | POA: Diagnosis not present

## 2018-02-05 DIAGNOSIS — N390 Urinary tract infection, site not specified: Secondary | ICD-10-CM | POA: Diagnosis not present

## 2018-02-05 LAB — POCT URINALYSIS DIPSTICK
Spec Grav, UA: <=1.005 — AB (ref 1.010–1.025)
pH, UA: 7 (ref 5.0–8.0)

## 2018-02-05 MED ORDER — LISINOPRIL 20 MG PO TABS: 20.0000 mg | ORAL_TABLET | Freq: Every day | ORAL | 3 refills | Status: DC

## 2018-02-05 MED ORDER — HYDROCHLOROTHIAZIDE 25 MG PO TABS: 25.0000 mg | ORAL_TABLET | Freq: Every day | ORAL | 3 refills | Status: DC

## 2018-02-05 NOTE — Patient Instructions (Signed)
DASH Eating Plan DASH stands for "Dietary Approaches to Stop Hypertension." The DASH eating plan is a healthy eating plan that has been shown to reduce high blood pressure (hypertension). It may also reduce your risk for type 2 diabetes, heart disease, and stroke. The DASH eating plan may also help with weight loss. What are tips for following this plan? General guidelines  Avoid eating more than 2,300 mg (milligrams) of salt (sodium) a day. If you have hypertension, you may need to reduce your sodium intake to 1,500 mg a day.  Limit alcohol intake to no more than 1 drink a day for nonpregnant women and 2 drinks a day for men. One drink equals 12 oz of beer, 5 oz of wine, or 1 oz of hard liquor.  Work with your health care provider to maintain a healthy body weight or to lose weight. Ask what an ideal weight is for you.  Get at least 30 minutes of exercise that causes your heart to beat faster (aerobic exercise) most days of the week. Activities may include walking, swimming, or biking.  Work with your health care provider or diet and nutrition specialist (dietitian) to adjust your eating plan to your individual calorie needs. Reading food labels  Check food labels for the amount of sodium per serving. Choose foods with less than 5 percent of the Daily Value of sodium. Generally, foods with less than 300 mg of sodium per serving fit into this eating plan.  To find whole grains, look for the word "whole" as the first word in the ingredient list. Shopping  Buy products labeled as "low-sodium" or "no salt added."  Buy fresh foods. Avoid canned foods and premade or frozen meals. Cooking  Avoid adding salt when cooking. Use salt-free seasonings or herbs instead of table salt or sea salt. Check with your health care provider or pharmacist before using salt substitutes.  Do not fry foods. Cook foods using healthy methods such as baking, boiling, grilling, and broiling instead.  Cook with  heart-healthy oils, such as olive, canola, soybean, or sunflower oil. Meal planning   Eat a balanced diet that includes: ? 5 or more servings of fruits and vegetables each day. At each meal, try to fill half of your plate with fruits and vegetables. ? Up to 6-8 servings of whole grains each day. ? Less than 6 oz of lean meat, poultry, or fish each day. A 3-oz serving of meat is about the same size as a deck of cards. One egg equals 1 oz. ? 2 servings of low-fat dairy each day. ? A serving of nuts, seeds, or beans 5 times each week. ? Heart-healthy fats. Healthy fats called Omega-3 fatty acids are found in foods such as flaxseeds and coldwater fish, like sardines, salmon, and mackerel.  Limit how much you eat of the following: ? Canned or prepackaged foods. ? Food that is high in trans fat, such as fried foods. ? Food that is high in saturated fat, such as fatty meat. ? Sweets, desserts, sugary drinks, and other foods with added sugar. ? Full-fat dairy products.  Do not salt foods before eating.  Try to eat at least 2 vegetarian meals each week.  Eat more home-cooked food and less restaurant, buffet, and fast food.  When eating at a restaurant, ask that your food be prepared with less salt or no salt, if possible. What foods are recommended? The items listed may not be a complete list. Talk with your dietitian about what   dietary choices are best for you. Grains Whole-grain or whole-wheat bread. Whole-grain or whole-wheat pasta. Brown rice. Oatmeal. Quinoa. Bulgur. Whole-grain and low-sodium cereals. Pita bread. Low-fat, low-sodium crackers. Whole-wheat flour tortillas. Vegetables Fresh or frozen vegetables (raw, steamed, roasted, or grilled). Low-sodium or reduced-sodium tomato and vegetable juice. Low-sodium or reduced-sodium tomato sauce and tomato paste. Low-sodium or reduced-sodium canned vegetables. Fruits All fresh, dried, or frozen fruit. Canned fruit in natural juice (without  added sugar). Meat and other protein foods Skinless chicken or turkey. Ground chicken or turkey. Pork with fat trimmed off. Fish and seafood. Egg whites. Dried beans, peas, or lentils. Unsalted nuts, nut butters, and seeds. Unsalted canned beans. Lean cuts of beef with fat trimmed off. Low-sodium, lean deli meat. Dairy Low-fat (1%) or fat-free (skim) milk. Fat-free, low-fat, or reduced-fat cheeses. Nonfat, low-sodium ricotta or cottage cheese. Low-fat or nonfat yogurt. Low-fat, low-sodium cheese. Fats and oils Soft margarine without trans fats. Vegetable oil. Low-fat, reduced-fat, or light mayonnaise and salad dressings (reduced-sodium). Canola, safflower, olive, soybean, and sunflower oils. Avocado. Seasoning and other foods Herbs. Spices. Seasoning mixes without salt. Unsalted popcorn and pretzels. Fat-free sweets. What foods are not recommended? The items listed may not be a complete list. Talk with your dietitian about what dietary choices are best for you. Grains Baked goods made with fat, such as croissants, muffins, or some breads. Dry pasta or rice meal packs. Vegetables Creamed or fried vegetables. Vegetables in a cheese sauce. Regular canned vegetables (not low-sodium or reduced-sodium). Regular canned tomato sauce and paste (not low-sodium or reduced-sodium). Regular tomato and vegetable juice (not low-sodium or reduced-sodium). Pickles. Olives. Fruits Canned fruit in a light or heavy syrup. Fried fruit. Fruit in cream or butter sauce. Meat and other protein foods Fatty cuts of meat. Ribs. Fried meat. Bacon. Sausage. Bologna and other processed lunch meats. Salami. Fatback. Hotdogs. Bratwurst. Salted nuts and seeds. Canned beans with added salt. Canned or smoked fish. Whole eggs or egg yolks. Chicken or turkey with skin. Dairy Whole or 2% milk, cream, and half-and-half. Whole or full-fat cream cheese. Whole-fat or sweetened yogurt. Full-fat cheese. Nondairy creamers. Whipped toppings.  Processed cheese and cheese spreads. Fats and oils Butter. Stick margarine. Lard. Shortening. Ghee. Bacon fat. Tropical oils, such as coconut, palm kernel, or palm oil. Seasoning and other foods Salted popcorn and pretzels. Onion salt, garlic salt, seasoned salt, table salt, and sea salt. Worcestershire sauce. Tartar sauce. Barbecue sauce. Teriyaki sauce. Soy sauce, including reduced-sodium. Steak sauce. Canned and packaged gravies. Fish sauce. Oyster sauce. Cocktail sauce. Horseradish that you find on the shelf. Ketchup. Mustard. Meat flavorings and tenderizers. Bouillon cubes. Hot sauce and Tabasco sauce. Premade or packaged marinades. Premade or packaged taco seasonings. Relishes. Regular salad dressings. Where to find more information:  National Heart, Lung, and Blood Institute: www.nhlbi.nih.gov  American Heart Association: www.heart.org Summary  The DASH eating plan is a healthy eating plan that has been shown to reduce high blood pressure (hypertension). It may also reduce your risk for type 2 diabetes, heart disease, and stroke.  With the DASH eating plan, you should limit salt (sodium) intake to 2,300 mg a day. If you have hypertension, you may need to reduce your sodium intake to 1,500 mg a day.  When on the DASH eating plan, aim to eat more fresh fruits and vegetables, whole grains, lean proteins, low-fat dairy, and heart-healthy fats.  Work with your health care provider or diet and nutrition specialist (dietitian) to adjust your eating plan to your individual   calorie needs. This information is not intended to replace advice given to you by your health care provider. Make sure you discuss any questions you have with your health care provider. Document Released: 06/30/2011 Document Revised: 07/04/2016 Document Reviewed: 07/04/2016 Elsevier Interactive Patient Education  2018 [employer]  

## 2018-02-05 NOTE — Progress Notes (Signed)
   Subjective:    Patient ID: [name], female    DOB: [birth year], 47 y.o.   MRN: [id number]  Hypertension  Chronicity: Follow up. Pertinent negatives include no chest pain or shortness of breath. Treatments tried: lisinopril, hctz.   On antibiotic for 3 years for bladder infection. Would like urine checked today.  Patient has been on Macrobid for several years according to the patient  Patient for blood pressure check up.  The patient does have hypertension.  The patient is on medication.  Patient relates compliance with meds. Todays BP reviewed with the patient. Patient denies issues with medication. Patient relates reasonable diet. Patient tries to minimize salt. Patient aware of BP goals.   Review of Systems  Constitutional: Negative for activity change, appetite change and fatigue.  HENT: Negative for congestion and rhinorrhea.   Respiratory: Negative for cough and shortness of breath.   Cardiovascular: Negative for chest pain and leg swelling.  Gastrointestinal: Negative for abdominal pain and constipation.  Endocrine: Negative for polydipsia and polyphagia.  Skin: Negative for color change.  Neurological: Negative for weakness.  Psychiatric/Behavioral: Negative for agitation and confusion.       Objective:   Physical Exam  Constitutional: She appears well-nourished. No distress.  HENT:  Head: Normocephalic and atraumatic.  Eyes: Right eye exhibits no discharge. Left eye exhibits no discharge.  Cardiovascular: Normal rate, regular rhythm and normal heart sounds.  No murmur heard. Pulmonary/Chest: Effort normal and breath sounds normal. No respiratory distress.  Musculoskeletal: She exhibits no edema.  Lymphadenopathy:    She has no cervical adenopathy.  Neurological: She is alert. She exhibits normal muscle tone.  Psychiatric: Her behavior is normal.  Vitals reviewed.   15 minutes was spent with patient today discussing healthcare issues which they came.  More  than 50% of this visit-total duration of visit-was spent in counseling and coordination of care.  Please see diagnosis regarding the focus of this coordination and care       Assessment & Plan:  HTN- Patient was seen today as part of a visit regarding hypertension. The importance of healthy diet and regular physical activity was discussed. The importance of compliance with medications discussed.  Ideal goal is to keep blood pressure low elevated levels certainly below 500/37 when possible.  The patient was counseled that keeping blood pressure under control lessen his risk of complications.  The importance of regular follow-ups was discussed with the patient.  Low-salt diet such as DASH recommended.  Regular physical activity was recommended as well.  Patient was advised to keep regular follow-ups. I find no evidence of urinary tract infection on today's exam I did advise the patient to discuss  The safety of Macrobid with her urologist who is prescribing this specifically regarding the possibility of pulmonary fibrosis

## 2018-02-09 ENCOUNTER — Other Ambulatory Visit: Payer: Self-pay | Admitting: Family Medicine

## 2018-02-14 IMAGING — US US ABDOMEN COMPLETE
1 series · 13 of 25 positions shown · non-contrast
Comparison: Ultrasound abdomen dated 02/15/2016.

CLINICAL DATA: NASH

EXAM:
ABDOMEN ULTRASOUND COMPLETE

[Series 1: us abdomen complete · 0.23mm/px · 13 of 86 slices shown]
[im 1/86]
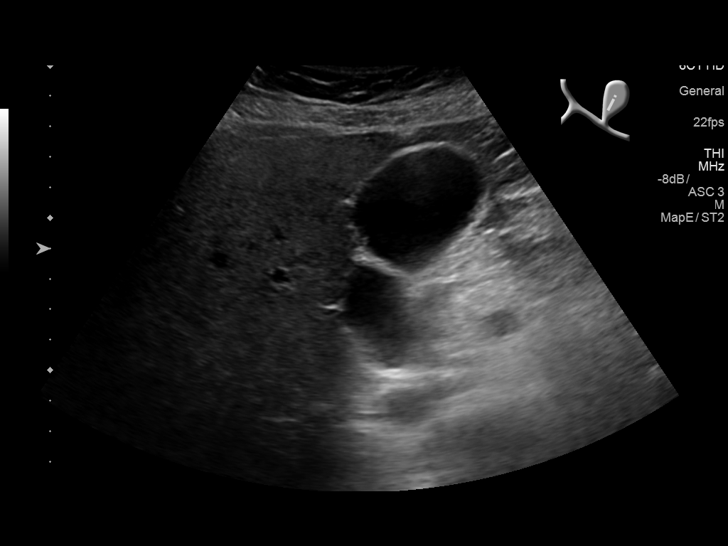
[im 8/86]
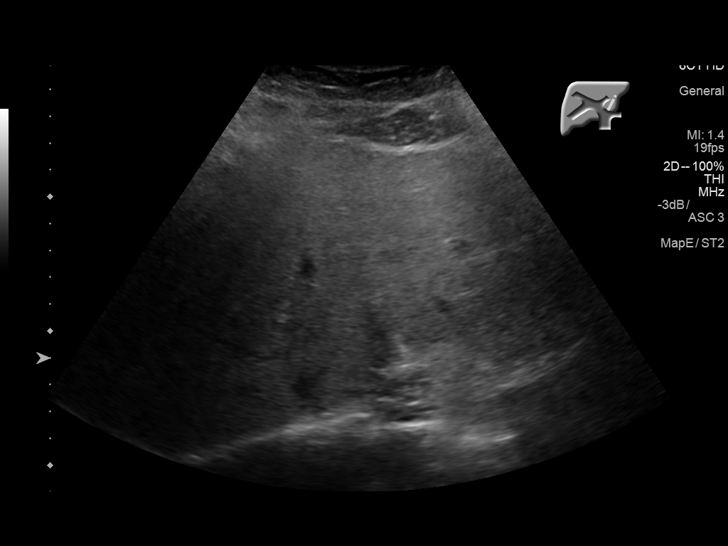
[im 15/86]
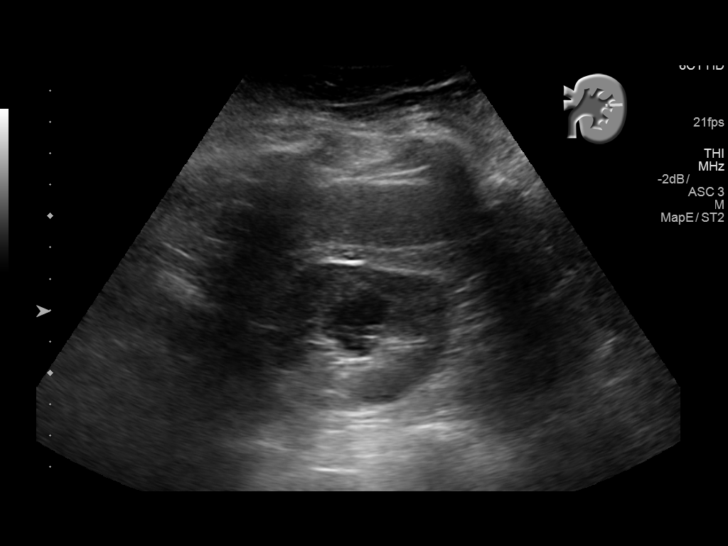
[im 22/86]
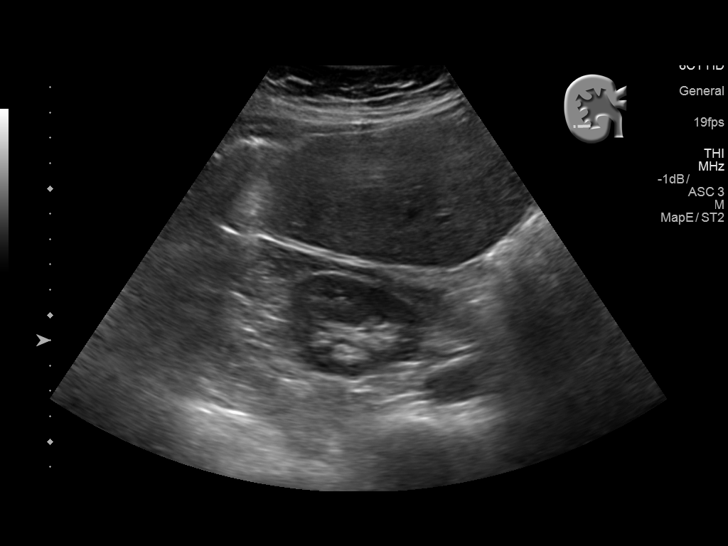
[im 29/86]
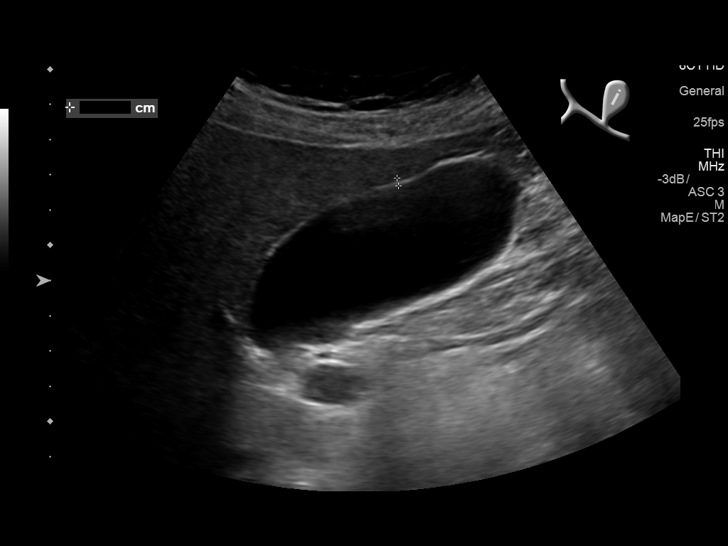
[im 36/86]
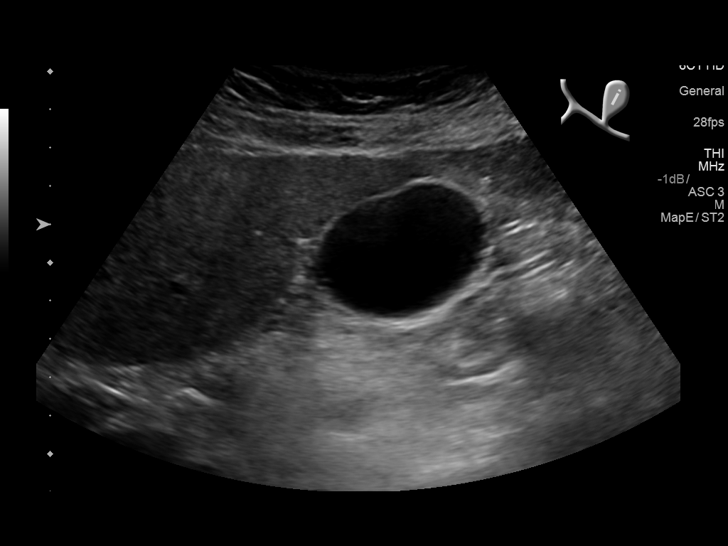
[im 43/86]
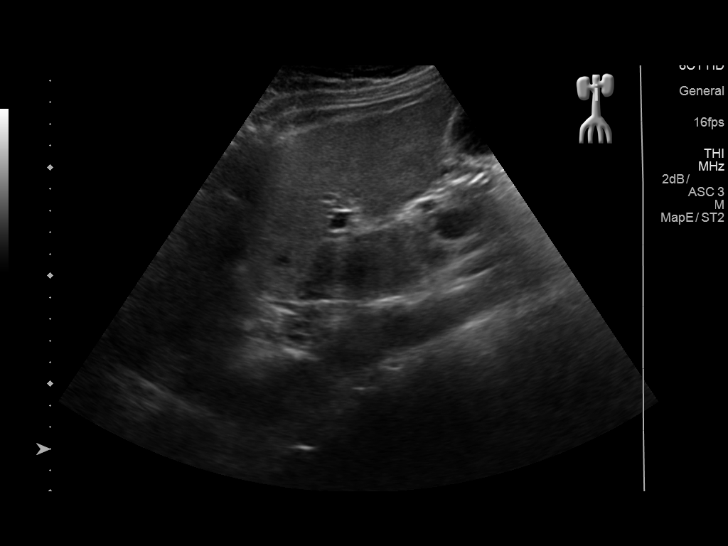
[im 50/86]
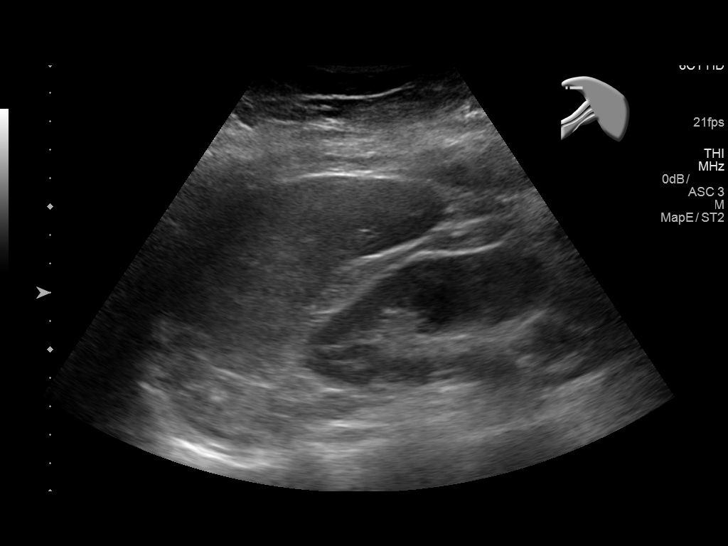
[im 57/86]
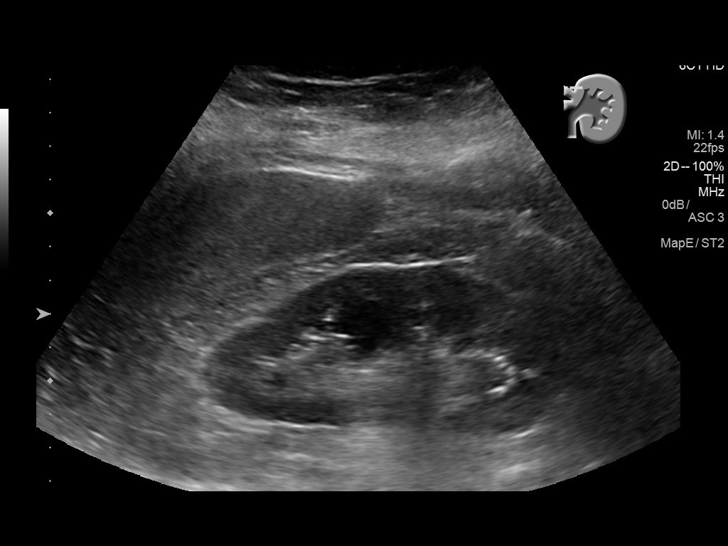
[im 64/86]
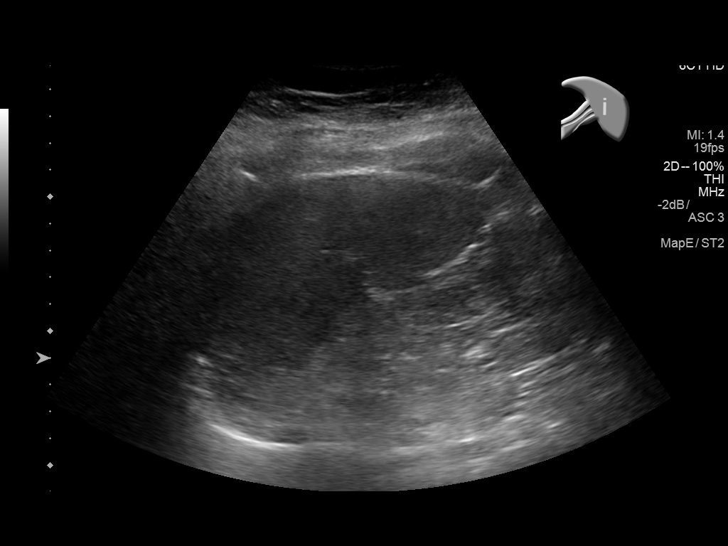
[im 71/86]
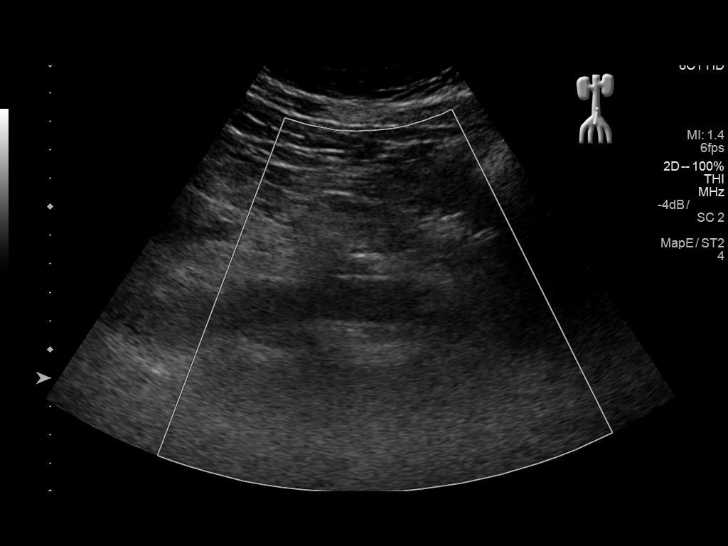
[im 78/86]
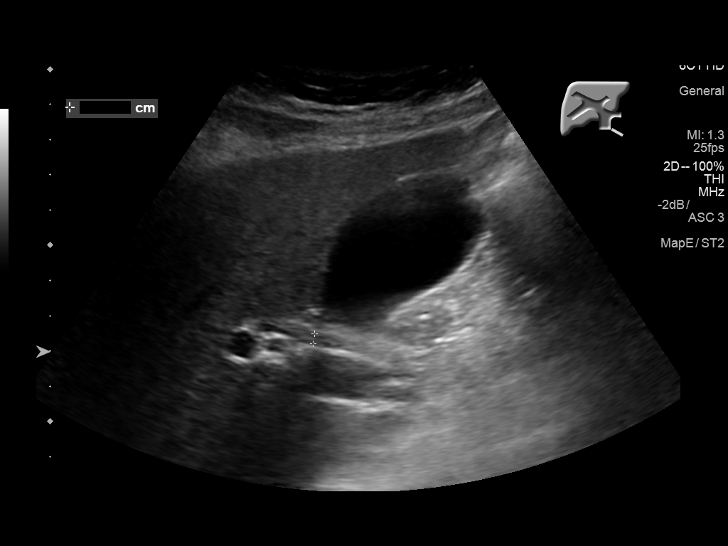
[im 86/86]
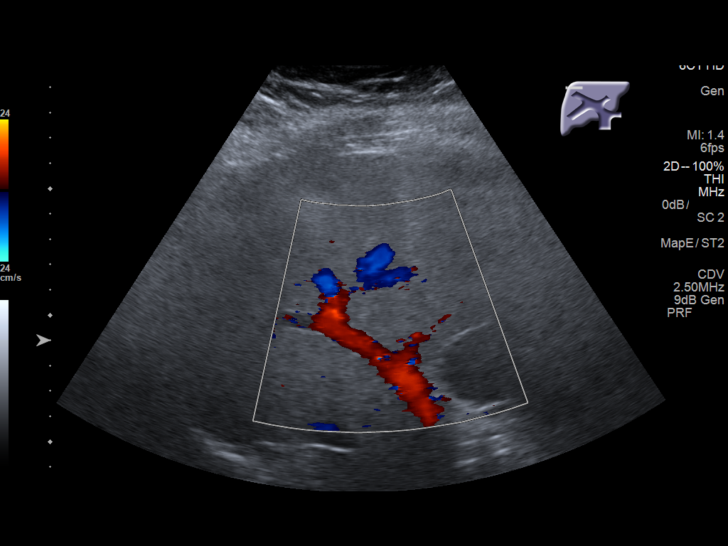

[13 of 25 positions shown; findings below may reference images not displayed]

FINDINGS: Gallbladder: No gallstones or wall thickening visualized. No
sonographic Murphy sign noted by sonographer.

Common bile duct: Diameter: 2.9 mm

Liver: At least mildly echogenic throughout. No focal mass or lesion
identified. As described on the earlier report, at least mild
nodularity of the peripheral hepatic contours suggesting early
cirrhosis. No suspicious mass or lesion is identified in the liver.

IVC: No abnormality visualized.

Pancreas: Visualized portion unremarkable.

Spleen: Size and appearance within normal limits.

Right Kidney: Length: 11 cm. Echogenicity within normal limits. No
mass or hydronephrosis visualized.

Left Kidney: Length: 11 cm. Two benign appearing cysts, largest
measuring 2.6 cm. No suspicious mass or hydronephrosis.

Abdominal aorta: No aneurysm visualized. Bifurcation not well seen
secondary to overlying bowel gas. The

Other findings: Portal vein appears grossly patent with appropriate
hepatopetal direction of flow.
IMPRESSION: 1. Coarse hepatic echotexture with mildly nodular hepatic contours
suggesting early/mild cirrhosis, similar to previous exam. Suspect
some underlying fatty infiltration of the liver as well. No
suspicious mass or lesion within the liver.
2. No acute findings.

## 2018-03-06 ENCOUNTER — Other Ambulatory Visit: Payer: Self-pay | Admitting: Family Medicine

## 2018-03-11 ENCOUNTER — Other Ambulatory Visit: Payer: Self-pay | Admitting: Family Medicine

## 2018-03-12 NOTE — Telephone Encounter (Signed)
Is may have this plus for refills

## 2018-05-03 ENCOUNTER — Other Ambulatory Visit: Payer: Self-pay | Admitting: Family Medicine

## 2018-08-22 ENCOUNTER — Other Ambulatory Visit: Payer: Self-pay | Admitting: Obstetrics & Gynecology

## 2018-08-22 ENCOUNTER — Other Ambulatory Visit: Payer: Self-pay | Admitting: Family Medicine

## 2018-11-02 ENCOUNTER — Other Ambulatory Visit: Payer: Self-pay | Admitting: Family Medicine

## 2018-12-07 ENCOUNTER — Telehealth: Payer: Self-pay | Admitting: Family Medicine

## 2018-12-07 MED ORDER — LISINOPRIL 10 MG PO TABS: 20.0000 mg | ORAL_TABLET | Freq: Every day | ORAL | 0 refills | Status: DC

## 2018-12-07 MED ORDER — CLONAZEPAM 1 MG PO TABS: ORAL_TABLET | ORAL | 0 refills | Status: DC

## 2018-12-07 NOTE — Telephone Encounter (Signed)
Left message to return call 

## 2018-12-07 NOTE — Telephone Encounter (Signed)
Pt changed pharmacies & needs refills sent to new pharmacy  clonazePAM (KLONOPIN) 1 MG tablet   lisinopril (PRINIVIL,ZESTRIL) 20 MG tablet (pharmacy is unable to get 63m, suggested that we change to 153m- 2 tablets daily)  Please advise & call pt when done   [facility] [place] ph# [phone]

## 2018-12-07 NOTE — Telephone Encounter (Signed)
One m o both, o v with dr Nicki Reaper this mo

## 2018-12-07 NOTE — Telephone Encounter (Signed)
Prescriptions sent to new pharmacy as requested. Patient notified and scheduled virtual visit for medication follow up wit Dr Nicki Reaper

## 2018-12-10 ENCOUNTER — Other Ambulatory Visit: Payer: Self-pay

## 2018-12-10 ENCOUNTER — Encounter: Payer: Self-pay | Admitting: Family Medicine

## 2018-12-10 ENCOUNTER — Ambulatory Visit (INDEPENDENT_AMBULATORY_CARE_PROVIDER_SITE_OTHER): Payer: 59 | Admitting: Family Medicine

## 2018-12-10 VITALS — Wt 178.0 lb

## 2018-12-10 DIAGNOSIS — E785 Hyperlipidemia, unspecified: Secondary | ICD-10-CM

## 2018-12-10 DIAGNOSIS — E039 Hypothyroidism, unspecified: Secondary | ICD-10-CM | POA: Diagnosis not present

## 2018-12-10 DIAGNOSIS — J019 Acute sinusitis, unspecified: Secondary | ICD-10-CM

## 2018-12-10 DIAGNOSIS — F411 Generalized anxiety disorder: Secondary | ICD-10-CM | POA: Diagnosis not present

## 2018-12-10 DIAGNOSIS — I10 Essential (primary) hypertension: Secondary | ICD-10-CM

## 2018-12-10 MED ORDER — LISINOPRIL 10 MG PO TABS: 20.0000 mg | ORAL_TABLET | Freq: Every day | ORAL | 1 refills | Status: DC

## 2018-12-10 MED ORDER — CITALOPRAM HYDROBROMIDE 40 MG PO TABS: 40.0000 mg | ORAL_TABLET | Freq: Every day | ORAL | 3 refills | Status: DC

## 2018-12-10 MED ORDER — CLONAZEPAM 1 MG PO TABS: ORAL_TABLET | ORAL | 5 refills | Status: DC

## 2018-12-10 MED ORDER — HYDROCHLOROTHIAZIDE 25 MG PO TABS: 25.0000 mg | ORAL_TABLET | Freq: Every day | ORAL | 3 refills | Status: DC

## 2018-12-10 MED ORDER — AMOXICILLIN 500 MG PO TABS: 500.0000 mg | ORAL_TABLET | Freq: Three times a day (TID) | ORAL | 0 refills | Status: DC

## 2018-12-10 NOTE — Progress Notes (Signed)
Subjective:    Patient ID: [name], female    DOB: [birth year], 48 y.o.   MRN: [id number] Audio and visual Hypertension  This is a chronic problem. Pertinent negatives include no chest pain or shortness of breath. There are no compliance problems.    Patient for blood pressure check up.  The patient does have hypertension.  The patient is on medication.  Patient relates compliance with meds. Todays BP reviewed with the patient. Patient denies issues with medication. Patient relates reasonable diet. Patient tries to minimize salt. Patient aware of BP goals.  Patient has had problems with cholesterol being and she is watching her diet we will check lab work await the results of that before deciding what to do  Generalized anxiety uses Celexa on a regular basis takes Klonopin twice a day denies any abusing the medicine needs refill sent in  Patient has thyroid condition.  Takes thyroid medication on a regular basis.  States that the proper way.  Relates compliance.  States no negative side effects.  States condition seems to be under good control.   Pt is needing refills on Klonopin 1 mg tablets and Lisinopril 10 mg tablets.   Pt states she has been congested for about 3-4 days. She has also experienced nose bleeds. Pt believes this is due to allergies.  Virtual Visit via Video Note  I connected with [name] on 12/10/18 at  3:00 PM EDT by a video enabled telemedicine application and verified that I am speaking with the correct person using two identifiers.  Location: Patient: home Provider: office   I discussed the limitations of evaluation and management by telemedicine and the availability of in person appointments. The patient expressed understanding and agreed to proceed.  History of Present Illness:    Observations/Objective:   Assessment and Plan:   Follow Up Instructions:    I discussed the assessment and treatment plan with the patient. The patient was  provided an opportunity to ask questions and all were answered. The patient agreed with the plan and demonstrated an understanding of the instructions.   The patient was advised to call back or seek an in-person evaluation if the symptoms worsen or if the condition fails to improve as anticipated.  I provided 15 minutes of non-face-to-face time during this encounter.   Vicente Males, LPN    Review of Systems  Constitutional: Negative for activity change, fatigue and fever.  HENT: Positive for congestion, nosebleeds and rhinorrhea. Negative for ear discharge and ear pain.   Eyes: Negative for discharge.  Respiratory: Positive for cough. Negative for shortness of breath and wheezing.   Cardiovascular: Negative for chest pain.       Objective:   Physical Exam  Patient had virtual visit Appears to be in no distress Atraumatic Neuro able to relate and oriented No apparent resp distress Color normal       Assessment & Plan:  HTN- Patient was seen today as part of a visit regarding hypertension. The importance of healthy diet and regular physical activity was discussed. The importance of compliance with medications discussed.  Ideal goal is to keep blood pressure low elevated levels certainly below 481/85 when possible.  The patient was counseled that keeping blood pressure under control lessen his risk of complications.  The importance of regular follow-ups was discussed with the patient.  Low-salt diet such as DASH recommended.  Regular physical activity was recommended as well.  Patient was advised to keep regular follow-ups.  Patient was seen today regarding hypothyroidism.  Importance of healthy diet, regular physical activity was discussed.  Importance of compliance with medication and regular checks regarding this was discussed.  Patient would like to do lab work then will let us know what she would like to do regarding her thyroid  She does have some anxiety related  issues takes her citalopram and Klonopin does well with that is requesting refills  Patient was seen today for upper respiratory illness. It is felt that the patient is dealing with sinusitis.  Antibiotics were prescribed today. Importance of compliance with medication was discussed.  Symptoms should gradually resolve over the course of the next several days. If high fevers, progressive illness, difficulty breathing, worsening condition or failure for symptoms to improve over the next several days then the patient is to follow-up.  If any emergent conditions the patient is to follow-up in the emergency department otherwise to follow-up in the office.  As for her cholesterol she requested we check lab work and then await the results of

## 2019-02-02 ENCOUNTER — Encounter: Payer: Self-pay | Admitting: Internal Medicine

## 2019-04-18 LAB — HEPATIC FUNCTION PANEL
ALT: 117 IU/L — ABNORMAL HIGH (ref 0–32)
AST: 110 IU/L — ABNORMAL HIGH (ref 0–40)
Albumin: 4.4 g/dL (ref 3.8–4.8)
Alkaline Phosphatase: 102 IU/L (ref 39–117)
Bilirubin Total: 0.4 mg/dL (ref 0.0–1.2)
Bilirubin, Direct: 0.1 mg/dL (ref 0.00–0.40)
Total Protein: 7.3 g/dL (ref 6.0–8.5)

## 2019-04-18 LAB — BASIC METABOLIC PANEL
BUN/Creatinine Ratio: 14 (ref 9–23)
BUN: 10 mg/dL (ref 6–24)
CO2: 27 mmol/L (ref 20–29)
Calcium: 9.5 mg/dL (ref 8.7–10.2)
Chloride: 100 mmol/L (ref 96–106)
Creatinine, Ser: 0.71 mg/dL (ref 0.57–1.00)
GFR calc Af Amer: 116 mL/min/{1.73_m2} (ref >59)
GFR calc non Af Amer: 100 mL/min/{1.73_m2} (ref >59)
Glucose: 113 mg/dL — ABNORMAL HIGH (ref 65–99)
Potassium: 4.9 mmol/L (ref 3.5–5.2)
Sodium: 140 mmol/L (ref 134–144)

## 2019-04-18 LAB — LIPID PANEL
Chol/HDL Ratio: 5.3 ratio — ABNORMAL HIGH (ref 0.0–4.4)
Cholesterol, Total: 244 mg/dL — ABNORMAL HIGH (ref 100–199)
HDL: 46 mg/dL (ref >39)
LDL Chol Calc (NIH): 157 mg/dL — ABNORMAL HIGH (ref 0–99)
Triglycerides: 223 mg/dL — ABNORMAL HIGH (ref 0–149)
VLDL Cholesterol Cal: 41 mg/dL — ABNORMAL HIGH (ref 5–40)

## 2019-04-18 LAB — TSH: TSH: 1.46 u[IU]/mL (ref 0.450–4.500)

## 2019-05-06 ENCOUNTER — Other Ambulatory Visit: Payer: Self-pay | Admitting: Family Medicine

## 2019-05-14 ENCOUNTER — Ambulatory Visit: Payer: 59 | Admitting: Internal Medicine

## 2019-05-14 ENCOUNTER — Encounter: Payer: Self-pay | Admitting: Internal Medicine

## 2019-05-14 ENCOUNTER — Other Ambulatory Visit: Payer: Self-pay

## 2019-05-14 VITALS — BP 160/102 | HR 96 | Temp 97.9°F | Ht 64.5 in | Wt 197.4 lb

## 2019-05-14 DIAGNOSIS — E785 Hyperlipidemia, unspecified: Secondary | ICD-10-CM | POA: Diagnosis not present

## 2019-05-14 DIAGNOSIS — K5900 Constipation, unspecified: Secondary | ICD-10-CM

## 2019-05-14 DIAGNOSIS — I10 Essential (primary) hypertension: Secondary | ICD-10-CM

## 2019-05-14 DIAGNOSIS — K7581 Nonalcoholic steatohepatitis (NASH): Secondary | ICD-10-CM | POA: Diagnosis not present

## 2019-05-14 NOTE — Patient Instructions (Signed)
Resume Vitamin E 800 IU daily.  Please purchase the following medications over the counter and take as directed: Metamucil or Citrucel 1-2 tablespoons daily  Speak with your Primary Care Physician in regards to getting your blood pressure, and high cholesterol under control.  Get plenty of exercise and work on diet and weight loss for control of your NASH.  Please follow up with Dr Hilarie Fredrickson in 6 months.  If you are age 50 or older, your body mass index should be between 23-30. Your Body mass index is 33.36 kg/m. If this is out of the aforementioned range listed, please consider follow up with your Primary Care Provider.  If you are age 8 or younger, your body mass index should be between 19-25. Your Body mass index is 33.36 kg/m. If this is out of the aformentioned range listed, please consider follow up with your Primary Care Provider.

## 2019-05-15 NOTE — Progress Notes (Signed)
Subjective:    Patient ID: [name], female    DOB: [birth year], 48 y.o.   MRN: [id number]  HPI [name] is a 48 year old female with a history of biopsy-proven NASH (grade 2 fibrosis on biopsy February 2018), IBS with constipation who is seen for follow-up.  She presents alone today.  She was last seen 2 years ago.  At the time of her last office visit her liver enzymes had normalized.  She was able to lose 20 pounds and has been taking vitamin E.  She was also following with [facility] and was taking multiple nutritional supplements.  Today she reports that she has not been seeing [facility] any longer because she simply could not afford the supplements.  She was also told that each visit "they found a new problem".  She reports that she is no longer taking vitamin E.  She has gained back some of the weight that she had been successful at losing.  She is thinking about beginning at exercise regimen with her husband.  He has recently been diagnosed with diabetes.  She is trying to follow a relatively low carbohydrate diet.  She is also been using essential oils and some supplements that she buys through [facility].  These include turmeric, a probiotic, and a multivitamin.  She reports that she is also been off of her blood pressure medication specifically hydrochlorothiazide and lisinopril because she has been trying to regulate that without medication.  I do not think she is taking pravastatin either.  She has been struggling with constipation having hard and "knotty" stools with small incomplete evacuation.  This will be followed by larger stool with loose urgent large evacuations which are difficult to control.  No blood in her stool or melena.  No upper abdominal pain or hepatobiliary complaint.  No jaundice.  No itching.  No issue with lower extremity edema.  Review of Systems  As per HPI, otherwise negative  Current Medications, Allergies, Past Medical History, Past  Surgical History, Family History and Social History were reviewed in [employer] record.     Objective:   Physical Exam BP (!) 160/102   Pulse 96   Temp 97.9 F (36.6 C)   Ht 5' 4.5" (1.638 m)   Wt 197 lb 6.4 oz (89.5 kg)   BMI 33.36 kg/m  Gen: awake, alert, NAD HEENT: anicteric, op clear CV: RRR, Pulm: CTA b/l Abd: soft, NT/ND, +BS throughout Ext: no c/c/e Neuro: nonfocal  Labs reviewed from primary care dated 04/18/2019 reveal an elevated AST and ALT at 110/117, respectively. Lipid profile revealed total cholesterol of 244, triglycerides elevated at 223, HDL 46, LDL 157 TSH was normal Glucose was mildly elevated at 113 but the remainder of her basic metabolic panel was normal including her creatinine 0.71, BUN 10      Assessment & Plan:  48 year old female with a history of biopsy-proven NASH (grade 2 fibrosis on biopsy February 2018), IBS with constipation who is seen for follow-up.   1. NASH (biopsy-proven February 2018, grade 2 fibrosis at that time; of note F3/F4 fibrosis by elastography around the same time) --I do think she has mild to moderate fibrosis and not advanced fibrosis/cirrhosis.  I am concerned that her liver enzymes have become elevated again and this is associated with her fatty liver.  We discussed the importance of diet, exercise and controlling risk factors.  We discussed this at length today including the need for better control of her blood  pressure, cholesterol and blood glucose.  Metabolic syndrome will certainly drive her liver inflammation and fibrosis.  The fact that when she lost 20 pounds 2 years ago her liver enzymes normalized is very reassuring and I have encouraged her to work towards the goal of a 10 to 15% weight loss over the next 6 to 12 months. --Diet/exercise and risk factor modification as above  --Resume vitamin E therapy 800 IU daily --Initially I had recommended repeat EGD this year however given that we have  biopsy data showing stage 2 fibrosis rather than stage 4, variceal screening is not indicated.  Of note she did not have varices in 2017 at EGD.  2.  IBS with constipation --constipation predominance with overflow loose stools.  We discussed how constipation is the main culprit here.  She would like to treat this naturally and so I recommended that she begin Metamucil or Citrucel 1 working to 2 heaping tablespoons daily and a generous amount of water.  3.  CRC screening --normal colonoscopy April 2017, with family history of colon cancer repeat April 2022  4.  Hypertension/hyperlipidemia --uncontrolled blood pressure today.  Uncontrolled lipids by recent lab work.  I encouraged her to discuss this further with [name] and this will likely include resuming her antihypertensive regimen and statin.  I am certainly okay with statin in her case.  Follow-up with me in 6 months  25 minutes spent with the patient today. Greater than 50% was spent in counseling and coordination of care with the patient

## 2019-05-20 DIAGNOSIS — R200 Anesthesia of skin: Secondary | ICD-10-CM | POA: Insufficient documentation

## 2019-06-10 ENCOUNTER — Other Ambulatory Visit: Payer: Self-pay | Admitting: Family Medicine

## 2019-06-10 NOTE — Telephone Encounter (Signed)
Needs to schedule follow-up I will go ahead and send in 1 refill Please send patient my chart message

## 2019-06-11 NOTE — Telephone Encounter (Signed)
My chart message as requested by provider

## 2019-07-03 ENCOUNTER — Other Ambulatory Visit: Payer: Self-pay

## 2019-07-03 ENCOUNTER — Ambulatory Visit (INDEPENDENT_AMBULATORY_CARE_PROVIDER_SITE_OTHER): Payer: 59 | Admitting: Family Medicine

## 2019-07-03 DIAGNOSIS — F411 Generalized anxiety disorder: Secondary | ICD-10-CM | POA: Diagnosis not present

## 2019-07-03 DIAGNOSIS — M797 Fibromyalgia: Secondary | ICD-10-CM

## 2019-07-03 DIAGNOSIS — K7581 Nonalcoholic steatohepatitis (NASH): Secondary | ICD-10-CM

## 2019-07-03 DIAGNOSIS — G609 Hereditary and idiopathic neuropathy, unspecified: Secondary | ICD-10-CM

## 2019-07-03 DIAGNOSIS — E785 Hyperlipidemia, unspecified: Secondary | ICD-10-CM

## 2019-07-03 DIAGNOSIS — I10 Essential (primary) hypertension: Secondary | ICD-10-CM | POA: Diagnosis not present

## 2019-07-03 MED ORDER — LISINOPRIL 10 MG PO TABS: 20.0000 mg | ORAL_TABLET | Freq: Every day | ORAL | 1 refills | Status: DC

## 2019-07-03 MED ORDER — CLONAZEPAM 1 MG PO TABS: ORAL_TABLET | ORAL | 5 refills | Status: DC

## 2019-07-03 MED ORDER — HYDROCHLOROTHIAZIDE 25 MG PO TABS: 25.0000 mg | ORAL_TABLET | Freq: Every day | ORAL | 1 refills | Status: DC

## 2019-07-03 MED ORDER — CITALOPRAM HYDROBROMIDE 40 MG PO TABS: 40.0000 mg | ORAL_TABLET | Freq: Every day | ORAL | 1 refills | Status: DC

## 2019-07-03 MED ORDER — PRAVASTATIN SODIUM 20 MG PO TABS: ORAL_TABLET | ORAL | 1 refills | Status: DC

## 2019-07-03 NOTE — Progress Notes (Signed)
Subjective:    Patient ID: [name], female    DOB: 04/09/1971, 48 y.o.   MRN: [id number]  Hyperlipidemia This is a chronic problem. Pertinent negatives include no chest pain or shortness of breath. Compliance problems: takes meds every day, eats healthy, exercises.    Bilateral foot pain for about one year.  Bilateral knee pain for about one year.  Patient denies any crepitus in the knees.  Denies any injuries.  It just hurts more going up and down steps Stress levels overall doing fairly well denies being depressed currently Patient does have fatty liver and [name] she is working hard at trying to be healthy with reading and keeping her weight down we will need to do some follow-up lab work Patient with intermittent fibromyalgia it gives her a lot of discomfort she tries various mechanisms to help with this She does have bladder issues takes her medicine for that is under decent control Hyperlipidemia watches diet takes her medicine compliant She is compliant with her citalopram uses clonazepam as needed She also takes her blood pressure medicine on a regular basis watches salt in diet tries to stay active PHQ9 and GAD 7 done.  GAD 7 : Generalized Anxiety Score 07/03/2019  Nervous, Anxious, on Edge 0  Control/stop worrying 0  Worry too much - different things 3  Trouble relaxing 1  Restless 0  Easily annoyed or irritable 0  Afraid - awful might happen 0  Total GAD 7 Score 4  Anxiety Difficulty Not difficult at all       Office Visit from 07/03/2019 in [facility]  PHQ-9 Total Score  1      Virtual Visit via Video Note  I connected with [name] on 07/03/19 at  3:00 PM EST by a video enabled telemedicine application and verified that I am speaking with the correct person using two identifiers.  Location: Patient: home Provider: office   I discussed the limitations of evaluation and management by telemedicine and the availability of in person  appointments. The patient expressed understanding and agreed to proceed.  History of Present Illness:    Observations/Objective:   Assessment and Plan:   Follow Up Instructions:    I discussed the assessment and treatment plan with the patient. The patient was provided an opportunity to ask questions and all were answered. The patient agreed with the plan and demonstrated an understanding of the instructions.   The patient was advised to call back or seek an in-person evaluation if the symptoms worsen or if the condition fails to improve as anticipated.  I provided 25 minutes of non-face-to-face time during this encounter.     Review of Systems  Constitutional: Negative for activity change, appetite change and fatigue.  HENT: Negative for congestion and rhinorrhea.   Respiratory: Negative for cough and shortness of breath.   Cardiovascular: Negative for chest pain and leg swelling.  Gastrointestinal: Negative for abdominal pain and diarrhea.  Endocrine: Negative for polydipsia and polyphagia.  Skin: Negative for color change.  Neurological: Negative for dizziness and weakness.  Psychiatric/Behavioral: Negative for behavioral problems and confusion.       Objective:   Physical Exam  Patient had virtual visit Appears to be in no distress Atraumatic Neuro able to relate and oriented No apparent resp distress Color normal       Assessment & Plan:  1. Essential hypertension bp good continue meds - Basic Metabolic Panel (BMET)  2. NASH (nonalcoholic steatohepatitis) Healthy diet  recheck labs - Hepatic function panel  3. Hyperlipidemia, unspecified hyperlipidemia type Take meds healthy diet - Lipid Profile  4. Anxiety state Use meds prn   5. Fibromyalgia stable  6. Idiopathic peripheral neuropathy In legs with numbness referral needed for eval and testing - Ambulatory referral to Neurology

## 2019-07-09 ENCOUNTER — Encounter: Payer: Self-pay | Admitting: Family Medicine

## 2019-08-07 ENCOUNTER — Other Ambulatory Visit: Payer: Self-pay | Admitting: Family Medicine

## 2019-08-14 ENCOUNTER — Ambulatory Visit (INDEPENDENT_AMBULATORY_CARE_PROVIDER_SITE_OTHER): Payer: 59 | Admitting: Diagnostic Neuroimaging

## 2019-08-14 ENCOUNTER — Other Ambulatory Visit: Payer: Self-pay

## 2019-08-14 ENCOUNTER — Encounter: Payer: Self-pay | Admitting: Diagnostic Neuroimaging

## 2019-08-14 VITALS — BP 126/85 | HR 118 | Temp 97.5°F | Ht 65.0 in | Wt 202.0 lb

## 2019-08-14 DIAGNOSIS — M5441 Lumbago with sciatica, right side: Secondary | ICD-10-CM | POA: Diagnosis not present

## 2019-08-14 DIAGNOSIS — R202 Paresthesia of skin: Secondary | ICD-10-CM | POA: Diagnosis not present

## 2019-08-14 DIAGNOSIS — M5442 Lumbago with sciatica, left side: Secondary | ICD-10-CM

## 2019-08-14 DIAGNOSIS — G8929 Other chronic pain: Secondary | ICD-10-CM

## 2019-08-14 DIAGNOSIS — R200 Anesthesia of skin: Secondary | ICD-10-CM

## 2019-08-14 NOTE — Progress Notes (Signed)
[facility]  PATIENT: [name] DOB: September 22, 1970  REFERRING CLINICIAN: Kathyrn Drown, MD HISTORY FROM: patient  REASON FOR VISIT: new consult    HISTORICAL  CHIEF COMPLAINT:  Chief Complaint  Patient presents with  . Peripheral Neuropathy    rm 7 New Pt, "feet w/numbness, pain; knot on side of both feet hurts; swelling in legs, feet for past 1- 1 1/2 years"       HISTORY OF PRESENT ILLNESS:   49 year old female here for evaluation of lower extremity pain and numbness.    Past 1 year patient has had numbness and tingling in bilateral feet up to her ankles.  This is worsening over time.  She also has low back pain, knee pain, "knots" on her feet, swelling in the feet and ankles.  Symptoms worse when she is physically active, squatting down, standing or walking for a long time.   REVIEW OF SYSTEMS: Full 14 system review of systems performed and negative with exception of: As per HPI.  ALLERGIES: Allergies  Allergen Reactions  . Dairy Aid [Lactase]   . Gluten Meal   . Levaquin [Levofloxacin In D5w] Diarrhea and Nausea And Vomiting  . Topamax [Topiramate]     Causes anger  . Wheat Bran     HOME MEDICATIONS: Outpatient Medications Prior to Visit  Medication Sig Dispense Refill  . acyclovir (ZOVIRAX) 400 MG tablet TAKE 1 TABLET BY MOUTH EVERY DAY 90 tablet 5  . albuterol (PROVENTIL HFA;VENTOLIN HFA) 108 (90 Base) MCG/ACT inhaler TAKE 2 PUFFS BY MOUTH EVERY 6 HOURS AS NEEDED FOR WHEEZE 6.7 Inhaler 8  . citalopram (CELEXA) 40 MG tablet Take 1 tablet (40 mg total) by mouth daily. 90 tablet 1  . clonazePAM (KLONOPIN) 1 MG tablet Take one half to one tablet bid prn 60 tablet 5  . cyclobenzaprine (FLEXERIL) 10 MG tablet Take 0.5-1 tablets (5-10 mg total) by mouth 3 (three) times daily as needed for muscle spasms. 50 tablet 0  . estradiol (ESTRACE) 1 MG tablet TAKE 1 TABLET BY MOUTH EVERY DAY 90 tablet 3  . fluticasone (FLONASE) 50 MCG/ACT nasal spray  USE 2 SPRAYS IN EACH NOSTRIL EVERY DAY (Patient taking differently: Place 2 sprays into both nostrils daily as needed. ) 48 g 5  . hydrochlorothiazide (HYDRODIURIL) 25 MG tablet Take 1 tablet (25 mg total) by mouth daily. 90 tablet 1  . ibuprofen (ADVIL,MOTRIN) 800 MG tablet Take 800 mg by mouth every 8 (eight) hours as needed. for pain  3  . lisinopril (ZESTRIL) 10 MG tablet Take 2 tablets (20 mg total) by mouth daily. 180 tablet 1  . Multiple Vitamin (MULTIVITAMIN) tablet Take 2 tablets by mouth daily.    Marland Kitchen MYRBETRIQ 50 MG TB24 tablet TAKE 1 TABLET BY MOUTH EVERY DAY 90 tablet 3  . nitrofurantoin (MACRODANTIN) 100 MG capsule Take 100 mg by mouth at bedtime.     . pravastatin (PRAVACHOL) 20 MG tablet TAKE 1 TABLET (20 MG TOTAL) BY MOUTH EVERY EVENING. 90 tablet 1  . Probiotic Product (PROBIOTIC DAILY PO) Take 2 tablets by mouth daily.    . Minocycline HCl (SOLODYN) 115 MG TB24 Take 115 mg by mouth every evening.    . valACYclovir (VALTREX) 1000 MG tablet TAKE 2 TABLETS BY MOUTH NOW THEN 2 TABLETS BY MOUTH 12 HOURS LATER 4 tablet 4   No facility-administered medications prior to visit.    PAST MEDICAL HISTORY: Past Medical History:  Diagnosis Date  . Allergy   .  Anemia    hx  . Anxiety    takes Klonopin daily as needed.takes Celexa daily  . Arthritis    left foot and hands  . Asthma    exercise induced  . Complication of anesthesia    slow to wake  up  . High cholesterol    takes Pravastatin daily  . History of bronchitis    several yrs ago  . History of migraine    last one about 2 wks ago  . Hypertension   . IBS (irritable bowel syndrome)    takes Trulance and ProBiotic Daily  . Inflammation of hair follicles    takes Minocycline daily  . Insomnia    takes Melatonin nightly  . Liver fibrosis   . Migraine   . NASH (nonalcoholic steatohepatitis)   . Sleep apnea    sleep study > 5 yrs ago. Uses CPAP  . Spastic bladder    takes Myrbetriq daily  . Tingling    r/t neck.  Pseudoarthrosis   . UTI (urinary tract infection)    hx of but takes Macrodantin daily. has been on for 2 1/2 yrs     PAST SURGICAL HISTORY: Past Surgical History:  Procedure Laterality Date  . ABDOMINAL HYSTERECTOMY  2005  . BREAST REDUCTION SURGERY  2009  . carpel tunnel Bilateral 02/2017, 2021  . CERVICAL DISC SURGERY  2007, 2009, 2014, 2017  . CESAREAN SECTION  94/03   x 2  . COLONOSCOPY    . ESOPHAGOGASTRODUODENOSCOPY    . POSTERIOR CERVICAL FUSION/FORAMINOTOMY N/A 01/07/2013   Procedure: POSTERIOR CERVICAL FUSION/FORAMINOTOMY LEVEL 2;  Surgeon: Hosie Spangle, MD;  Location: [facility];  Service: Neurosurgery;  Laterality: N/A;  Posterior Cervical Five-Six/Six-Seven Arthrodesis with Instrumentation  . POSTERIOR CERVICAL FUSION/FORAMINOTOMY N/A 07/04/2016   Procedure: CERVICAL THREE-FOUR POSTERIOR CERVICAL ARTHRODESIS;  Surgeon: Jovita Gamma, MD;  Location: [facility];  Service: Neurosurgery;  Laterality: N/A;  C3-C4 POSTERIOR CERVICAL ARTHRODESIS  . tummy tuck      FAMILY HISTORY: Family History  Problem Relation Age of Onset  . Cirrhosis Father        alcohol abuse  . Diabetes Father   . Hypertension Father   . Alcoholism Father   . Ovarian cancer Mother   . Hypertension Mother   . Irritable bowel syndrome Mother   . Neuropathy Mother   . Colon cancer Maternal Grandmother   . CAD Maternal Grandfather   . Drug abuse Brother   . Colon polyps Neg Hx   . Esophageal cancer Neg Hx   . Rectal cancer Neg Hx   . Stomach cancer Neg Hx     SOCIAL HISTORY: Social History   Socioeconomic History  . Marital status: Married    Spouse name: Laverna Peace  . Number of children: 2  . Years of education: college  . Highest education level: Associate degree: academic program  Occupational History  . Occupation: self employed  Tobacco Use  . Smoking status: Never Smoker  . Smokeless tobacco: Never Used  Substance and Sexual Activity  . Alcohol use: Yes    Alcohol/week: 0.0  standard drinks    Comment: occasional  . Drug use: No  . Sexual activity: Yes    Birth control/protection: Surgical  Other Topics Concern  . Not on file  Social History Narrative   Lives with family   Caffeine- 2 daily   Social Determinants of Health   Financial Resource Strain:   . Difficulty of Paying Living Expenses: Not on  file  Food Insecurity:   . Worried About Charity fundraiser in the Last Year: Not on file  . Ran Out of Food in the Last Year: Not on file  Transportation Needs:   . Lack of Transportation (Medical): Not on file  . Lack of Transportation (Non-Medical): Not on file  Physical Activity:   . Days of Exercise per Week: Not on file  . Minutes of Exercise per Session: Not on file  Stress:   . Feeling of Stress : Not on file  Social Connections:   . Frequency of Communication with Friends and Family: Not on file  . Frequency of Social Gatherings with Friends and Family: Not on file  . Attends Religious Services: Not on file  . Active Member of Clubs or Organizations: Not on file  . Attends Archivist Meetings: Not on file  . Marital Status: Not on file  Intimate Partner Violence:   . Fear of Current or Ex-Partner: Not on file  . Emotionally Abused: Not on file  . Physically Abused: Not on file  . Sexually Abused: Not on file     PHYSICAL EXAM  GENERAL EXAM/CONSTITUTIONAL: Vitals:  Vitals:   08/14/19 1425  BP: 126/85  Pulse: (!) 118  Temp: (!) 97.5 F (36.4 C)  Weight: 202 lb (91.6 kg)  Height: 5' 5"  (1.651 m)     Body mass index is 33.61 kg/m. Wt Readings from Last 3 Encounters:  08/14/19 202 lb (91.6 kg)  05/14/19 197 lb 6.4 oz (89.5 kg)  12/10/18 178 lb (80.7 kg)     Patient is in no distress; well developed, nourished and groomed; neck is supple  CARDIOVASCULAR:  Examination of carotid arteries is normal; no carotid bruits  Regular rate and rhythm, no murmurs  Examination of peripheral vascular system by  observation and palpation is normal  EYES:  Ophthalmoscopic exam of optic discs and posterior segments is normal; no papilledema or hemorrhages  No exam data present  MUSCULOSKELETAL:  Gait, strength, tone, movements noted in Neurologic exam below  NEUROLOGIC: MENTAL STATUS:  No flowsheet data found.  awake, alert, oriented to person, place and time  recent and remote memory intact  normal attention and concentration  language fluent, comprehension intact, naming intact  fund of knowledge appropriate  CRANIAL NERVE:   2nd - no papilledema on fundoscopic exam  2nd, 3rd, 4th, 6th - pupils equal and reactive to light, visual fields full to confrontation, extraocular muscles intact, no nystagmus  5th - facial sensation symmetric  7th - facial strength symmetric  8th - hearing intact  9th - palate elevates symmetrically, uvula midline  11th - shoulder shrug symmetric  12th - tongue protrusion midline  MOTOR:   normal bulk and tone, full strength in the BUE, BLE  SENSORY:   normal and symmetric to light touch, pinprick, temperature, vibration; EXCEPT SLIGHTLY DECR TEMP IN FEET  COORDINATION:   finger-nose-finger, fine finger movements normal  REFLEXES:   deep tendon reflexes 2+ and symmetric  GAIT/STATION:   narrow based gait; romberg is negative     DIAGNOSTIC DATA (LABS, IMAGING, TESTING) - I reviewed patient records, labs, notes, testing and imaging myself where available.  Lab Results  Component Value Date   WBC 8.1 08/31/2016   HGB 13.2 08/31/2016   HCT 40.0 08/31/2016   MCV 90.7 08/31/2016   PLT 304 08/31/2016      Component Value Date/Time   NA 140 04/17/2019 1144   K 4.9 04/17/2019  1144   CL 100 04/17/2019 1144   CO2 27 04/17/2019 1144   GLUCOSE 113 (H) 04/17/2019 1144   GLUCOSE 104 (H) 08/31/2016 1109   BUN 10 04/17/2019 1144   CREATININE 0.71 04/17/2019 1144   CREATININE 0.82 01/15/2015 2351   CALCIUM 9.5 04/17/2019 1144    PROT 7.3 04/17/2019 1144   ALBUMIN 4.4 04/17/2019 1144   AST 110 (H) 04/17/2019 1144   ALT 117 (H) 04/17/2019 1144   ALKPHOS 102 04/17/2019 1144   BILITOT 0.4 04/17/2019 1144   GFRNONAA 100 04/17/2019 1144   GFRAA 116 04/17/2019 1144   Lab Results  Component Value Date   CHOL 244 (H) 04/17/2019   HDL 46 04/17/2019   LDLCALC 157 (H) 04/17/2019   TRIG 223 (H) 04/17/2019   CHOLHDL 5.3 (H) 04/17/2019   Lab Results  Component Value Date   HGBA1C 6.1 (H) 06/01/2015   No results found for: [id number] Lab Results  Component Value Date   TSH 1.460 04/17/2019       ASSESSMENT AND PLAN  49 y.o. year old female here with bilateral lower extremity numbness and tingling, pain, low back pain, knee pain for past 1 to 2 years.  Ddx: neuropathy (metabolic, obesity, hereditary, nutritional, autoimmune), lumbar spinal stenosis, peripheral vascular dz, venous insufficiency, fibromyalgia  1. Numbness and tingling of both feet   2. Chronic bilateral low back pain with bilateral sciatica     PLAN:  BILATERAL LOWER EXTREMITY PAIN / NUMBNESS (with low back pain; with swelling in feet / ankles) - check MRI lumbar spine - check neuropathy labs - consider PT evaluation for low back, knee  Orders Placed This Encounter  Procedures  . MR LUMBAR SPINE WO CONTRAST  . Vitamin B12  . Hemoglobin [name]  . ANA,IFA RA Diag Pnl w/rflx Tit/Patn  . Multiple Myeloma Panel (SPEP&IFE w/QIG)   Return for pending if symptoms worsen or fail to improve.    Penni Bombard, MD 3/49/1791, 5:05 PM Certified in Neurology, Neurophysiology and Neuroimaging  [facility] [address] [place], [place] [address] [phone]

## 2019-08-14 NOTE — Patient Instructions (Signed)
BILATERAL LOWER EXTREMITY PAIN / NUMBNESS (with low back pain; with swelling in feet / ankles) - check MRI lumbar spine - check neuropathy labs - consider PT evaluation for low back, knee

## 2019-08-17 LAB — MULTIPLE MYELOMA PANEL, SERUM
Albumin SerPl Elph-Mcnc: 4.2 g/dL (ref 2.9–4.4)
Albumin/Glob SerPl: 1.3 (ref 0.7–1.7)
Alpha 1: 0.3 g/dL (ref 0.0–0.4)
Alpha2 Glob SerPl Elph-Mcnc: 0.8 g/dL (ref 0.4–1.0)
B-Globulin SerPl Elph-Mcnc: 1.3 g/dL (ref 0.7–1.3)
Gamma Glob SerPl Elph-Mcnc: 1.1 g/dL (ref 0.4–1.8)
Globulin, Total: 3.5 g/dL (ref 2.2–3.9)
IgA/Immunoglobulin A, Serum: 436 mg/dL — ABNORMAL HIGH (ref 87–352)
IgG (Immunoglobin G), Serum: 1091 mg/dL (ref 586–1602)
IgM (Immunoglobulin M), Srm: 101 mg/dL (ref 26–217)
Total Protein: 7.7 g/dL (ref 6.0–8.5)

## 2019-08-17 LAB — VITAMIN B12: Vitamin B-12: 884 pg/mL (ref 232–1245)

## 2019-08-17 LAB — ANA,IFA RA DIAG PNL W/RFLX TIT/PATN
ANA Titer 1: POSITIVE — AB
Cyclic Citrullin Peptide Ab: 8 units (ref 0–19)
Rheumatoid fact SerPl-aCnc: <10 IU/mL (ref 0.0–13.9)

## 2019-08-17 LAB — FANA STAINING PATTERNS
Homogeneous Pattern: 1:80 {titer}
Speckled Pattern: 1:80 {titer}

## 2019-08-17 LAB — HEMOGLOBIN A1C
Est. average glucose Bld gHb Est-mCnc: 120 mg/dL
Hgb A1c MFr Bld: 5.8 % — ABNORMAL HIGH (ref 4.8–5.6)

## 2019-08-19 ENCOUNTER — Telehealth: Payer: Self-pay | Admitting: *Deleted

## 2019-08-20 ENCOUNTER — Telehealth: Payer: Self-pay | Admitting: Family Medicine

## 2019-08-20 NOTE — Telephone Encounter (Signed)
LVM informing patient one of her labs, the ANA is positive. Dr Leta Baptist advised her to consider a rheumatology consult or PCP evaluation. Advised her other labs are okay. I advised she call back if she wants consult placed, left #.

## 2019-08-20 NOTE — Telephone Encounter (Signed)
Pt had tests done that Dr. Leta Baptist ordered & she'd like to talk to Dr. Nicki Reaper about the +ANA  She said all other tests were normal.  Dr. Leta Baptist recommended her to see rheumatology or follow up with her PCP  She would like to know what Dr. Nicki Reaper recommends (explained to pt that this may require a virtual visit)

## 2019-08-20 NOTE — Telephone Encounter (Signed)
Pt will be setting up virtual visit to discuss this with provider

## 2019-08-27 ENCOUNTER — Ambulatory Visit (INDEPENDENT_AMBULATORY_CARE_PROVIDER_SITE_OTHER): Payer: 59 | Admitting: Family Medicine

## 2019-08-27 ENCOUNTER — Other Ambulatory Visit: Payer: Self-pay

## 2019-08-27 DIAGNOSIS — R768 Other specified abnormal immunological findings in serum: Secondary | ICD-10-CM | POA: Diagnosis not present

## 2019-08-27 NOTE — Progress Notes (Signed)
   Subjective:    Patient ID: [name], female    DOB: [birth year], 49 y.o.   MRN: [id number]  HPI Pt has labs completed on 08/14/19 by Dr. Leta Baptist. Dr.Penumalli was concerned with one of the labs and informed her to speak with PCP. Pt is wanting to go over labs. Patient recently saw neurology.  They did an evaluation of her.  At that time they did a ANA testing.  It was positive and they were concerned that there could be underlying lupus.  Recommended for her to go ahead and be seen by [place] or rheumatology patient does have arthritis in her knees and ankles which is more than likely osteoarthritis She does not have any history of malar rash She denies undiagnosed fevers sweats nausea She denies any eye troubles, she denies fevers or myalgias virtual Visit via Video Note  I connected with [name] on 08/27/19 at  1:40 PM EST by a video enabled telemedicine application and verified that I am speaking with the correct person using two identifiers.  Location: Patient: home Provider: office   I discussed the limitations of evaluation and management by telemedicine and the availability of in person appointments. The patient expressed understanding and agreed to proceed.  History of Present Illness:    Observations/Objective:   Assessment and Plan:   Follow Up Instructions:    I discussed the assessment and treatment plan with the patient. The patient was provided an opportunity to ask questions and all were answered. The patient agreed with the plan and demonstrated an understanding of the instructions.   The patient was advised to call back or seek an in-person evaluation if the symptoms worsen or if the condition fails to improve as anticipated.  I provided 17 minutes of non-face-to-face time during this encounter.       Review of Systems  Constitutional: Negative for activity change, appetite change and fatigue.  HENT: Negative for congestion and rhinorrhea.     Respiratory: Negative for cough and shortness of breath.   Cardiovascular: Negative for chest pain and leg swelling.  Gastrointestinal: Negative for abdominal pain and diarrhea.  Endocrine: Negative for polydipsia and polyphagia.  Skin: Negative for color change.  Neurological: Negative for dizziness and weakness.  Psychiatric/Behavioral: Negative for behavioral problems and confusion.       Objective:   Physical Exam  Patient had virtual visit Appears to be in no distress Atraumatic Neuro able to relate and oriented No apparent resp distress Color normal      Assessment & Plan:  Positive ANA Probable osteoarthritis Referral to rheumatology to make sure that there is not possibility of lupus going on Follow-up here for regular health issues

## 2019-09-02 ENCOUNTER — Other Ambulatory Visit: Payer: Self-pay | Admitting: Obstetrics & Gynecology

## 2019-09-03 ENCOUNTER — Ambulatory Visit
Admission: RE | Admit: 2019-09-03 | Discharge: 2019-09-03 | Disposition: A | Discharge status: Home / Self Care | Payer: 59 | Source: Ambulatory Visit | Attending: Diagnostic Neuroimaging | Admitting: Diagnostic Neuroimaging

## 2019-09-03 DIAGNOSIS — R200 Anesthesia of skin: Secondary | ICD-10-CM

## 2019-09-03 DIAGNOSIS — G8929 Other chronic pain: Secondary | ICD-10-CM

## 2019-09-03 DIAGNOSIS — M5441 Lumbago with sciatica, right side: Secondary | ICD-10-CM

## 2019-09-03 DIAGNOSIS — R202 Paresthesia of skin: Secondary | ICD-10-CM

## 2019-09-03 DIAGNOSIS — M5442 Lumbago with sciatica, left side: Secondary | ICD-10-CM

## 2019-09-05 ENCOUNTER — Encounter: Payer: Self-pay | Admitting: Family Medicine

## 2019-09-20 DIAGNOSIS — M48061 Spinal stenosis, lumbar region without neurogenic claudication: Secondary | ICD-10-CM | POA: Insufficient documentation

## 2019-09-26 ENCOUNTER — Telehealth: Payer: Self-pay | Admitting: *Deleted

## 2019-09-26 NOTE — Telephone Encounter (Signed)
Spoke with patient and I informed her Dr Leta Baptist stated  the MRI lumbar spine showed degenerative changes and arthritis. She could consider PT, pain mgmt vs spine surgery consult. She saw Dr Sherwood Gambler last Fri. He will do an injection. She had sent my chart message in Feb asking MRI report be faxed to Dr Rita Ohara. She already had apt scheduled with him at that time.  Patient verbalized understanding, appreciation.

## 2019-10-08 ENCOUNTER — Other Ambulatory Visit: Payer: Self-pay | Admitting: Family Medicine

## 2019-11-05 NOTE — Progress Notes (Signed)
Office Visit Note  Patient: [name]             Date of Birth: 1971/02/19           MRN: [id number]             PCP: Kathyrn Drown, MD Referring: Kathyrn Drown, MD Visit Date: 11/13/2019 Occupation: @GUAROCC @  Subjective:  New Patient (Initial Visit) (Abnormal labs)   History of Present Illness: [name] is a 49 y.o. female seen in consultation per request of her PCP.  According to patient her symptoms started about 1 year ago with stiffness in her joints.  She states she continues to have pain and stiffness in her knee joints with a stair climbing.  She also feels that her right ankle joint gives out at times which makes her fall.  .  She has been experiencing leg cramps at night.  She denies any joint swelling.  She had some recent labs done by her PCP which came positive for ANA for that reason she was referred to me.  There is no history of oral ulcers, nasal ulcers, malar rash, photosensitivity, Raynaud's phenomenon, lymphadenopathy or joint swelling.  There is no family history of autoimmune disease.  She is gravida 2 para 2.  Activities of Daily Living:  Patient reports morning stiffness for 20 minutes.   Patient Reports nocturnal pain.  Difficulty dressing/grooming: Denies Difficulty climbing stairs: Reports Difficulty getting out of chair: Reports Difficulty using hands for taps, buttons, cutlery, and/or writing: Reports  Review of Systems  Constitutional: Positive for fatigue. Negative for night sweats, weight gain and weight loss.  HENT: Positive for mouth dryness. Negative for mouth sores, trouble swallowing, trouble swallowing and nose dryness.   Eyes: Positive for dryness. Negative for pain, redness and visual disturbance.  Respiratory: Positive for shortness of breath. Negative for cough and difficulty breathing.        History of asthma  Cardiovascular: Positive for swelling in legs/feet. Negative for chest pain, palpitations, hypertension and  irregular heartbeat.  Gastrointestinal: Positive for constipation and diarrhea. Negative for blood in stool.       History of IBS  Endocrine: Positive for heat intolerance. Negative for cold intolerance, excessive thirst and increased urination.  Genitourinary: Positive for painful urination. Negative for vaginal dryness.       History of spastic bladder  Musculoskeletal: Positive for arthralgias, gait problem, joint pain, myalgias, morning stiffness and myalgias. Negative for joint swelling, muscle weakness and muscle tenderness.  Skin: Positive for rash. Negative for color change, hair loss, skin tightness, ulcers and sensitivity to sunlight.  Allergic/Immunologic: Negative for susceptible to infections.  Neurological: Positive for headaches. Negative for dizziness, memory loss, night sweats and weakness.       History of migraine  Hematological: Negative for bruising/bleeding tendency and swollen glands.  Psychiatric/Behavioral: Positive for sleep disturbance. Negative for depressed mood. The patient is nervous/anxious.     PMFS History:  Patient Active Problem List   Diagnosis Date Noted  . DDD (degenerative disc disease), lumbar 11/13/2019  . Pseudoarthrosis of cervical spine ([place]) 07/04/2016  . Fibromyalgia 07/07/2015  . Obesity, unspecified 05/09/2013  . Bilateral renal cysts 05/09/2013  . Dyspareunia 02/25/2013  . Hyperglycemia 11/07/2012  . Other malaise and fatigue 11/07/2012  . Anxiety state 04/22/2010  . GERD 04/22/2010  . ILEUS 04/22/2010  . IRRITABLE BOWEL SYNDROME 04/22/2010  . Sleep apnea 04/22/2010  . LIVER FUNCTION TESTS, ABNORMAL, HX OF 04/22/2010  . Hyperlipidemia  12/03/2009  . Essential hypertension 12/03/2009  . NASH (nonalcoholic steatohepatitis) 12/03/2009  . CHEST PAIN UNSPECIFIED 12/03/2009    Past Medical History:  Diagnosis Date  . Allergy   . Anemia    hx  . Anxiety    takes Klonopin daily as needed.takes Celexa daily  . Arthritis    left foot  and hands  . Asthma    exercise induced  . Complication of anesthesia    slow to wake  up  . High cholesterol    takes Pravastatin daily  . History of bronchitis    several yrs ago  . History of migraine    last one about 2 wks ago  . Hypertension   . IBS (irritable bowel syndrome)    takes Trulance and ProBiotic Daily  . Inflammation of hair follicles    takes Minocycline daily  . Insomnia    takes Melatonin nightly  . Liver fibrosis   . Migraine   . NASH (nonalcoholic steatohepatitis)   . Sleep apnea    sleep study > 5 yrs ago. Uses CPAP  . Spastic bladder    takes Myrbetriq daily  . Tingling    r/t neck. Pseudoarthrosis   . UTI (urinary tract infection)    hx of but takes Macrodantin daily. has been on for 2 1/2 yrs     Family History  Problem Relation Age of Onset  . Cirrhosis Father        alcohol abuse  . Diabetes Father   . Hypertension Father   . Alcoholism Father   . Ovarian cancer Mother   . Hypertension Mother   . Irritable bowel syndrome Mother   . Neuropathy Mother   . Colon cancer Maternal Grandmother   . CAD Maternal Grandfather   . Drug abuse Brother   . Colon polyps Neg Hx   . Esophageal cancer Neg Hx   . Rectal cancer Neg Hx   . Stomach cancer Neg Hx    Past Surgical History:  Procedure Laterality Date  . ABDOMINAL HYSTERECTOMY  2005  . BREAST REDUCTION SURGERY  2009  . CARPAL TUNNEL RELEASE    . carpel tunnel Bilateral 02/2017, 2021  . CERVICAL DISC SURGERY  2007, 2009, 2014, 2017  . CESAREAN SECTION  94/03   x 2  . COLONOSCOPY    . ESOPHAGOGASTRODUODENOSCOPY    . POSTERIOR CERVICAL FUSION/FORAMINOTOMY N/A 01/07/2013   Procedure: POSTERIOR CERVICAL FUSION/FORAMINOTOMY LEVEL 2;  Surgeon: Hosie Spangle, MD;  Location: [facility];  Service: Neurosurgery;  Laterality: N/A;  Posterior Cervical Five-Six/Six-Seven Arthrodesis with Instrumentation  . POSTERIOR CERVICAL FUSION/FORAMINOTOMY N/A 07/04/2016   Procedure: CERVICAL THREE-FOUR  POSTERIOR CERVICAL ARTHRODESIS;  Surgeon: Jovita Gamma, MD;  Location: [facility];  Service: Neurosurgery;  Laterality: N/A;  C3-C4 POSTERIOR CERVICAL ARTHRODESIS  . tummy tuck     Social History   Social History Narrative   Lives with family   Caffeine- 2 daily   Immunization History  Administered Date(s) Administered  . Hep A / Hep B 03/15/2016, 04/14/2016, 09/19/2016  . Influenza Split 05/09/2013  . Influenza,inj,Quad PF,6+ Mos 05/19/2014, 06/01/2015, 05/19/2017  . Influenza-Unspecified 06/24/2010  . Td 03/25/2012, 10/31/2012     Objective: Vital Signs: BP (!) 142/92 (BP Location: Right Arm, Patient Position: Sitting, Cuff Size: Normal)   Pulse 95   Resp 16   Ht 5' 5"  (1.651 m)   Wt 212 lb (96.2 kg)   BMI 35.28 kg/m    Physical Exam Vitals and nursing  note reviewed.  Constitutional:      Appearance: She is well-developed.  HENT:     Head: Normocephalic and atraumatic.  Eyes:     Conjunctiva/sclera: Conjunctivae normal.  Cardiovascular:     Rate and Rhythm: Normal rate and regular rhythm.     Heart sounds: Normal heart sounds.  Pulmonary:     Effort: Pulmonary effort is normal.     Breath sounds: Normal breath sounds.  Abdominal:     General: Bowel sounds are normal.     Palpations: Abdomen is soft.  Musculoskeletal:     Cervical back: Normal range of motion.  Lymphadenopathy:     Cervical: No cervical adenopathy.  Skin:    General: Skin is warm and dry.     Capillary Refill: Capillary refill takes less than 2 seconds.  Neurological:     Mental Status: She is alert and oriented to person, place, and time.  Psychiatric:        Behavior: Behavior normal.      Musculoskeletal Exam: C-spine was a limited range of motion.  Lumbar spine was a limited range of motion.  Shoulder joints, elbow joints, wrist joints, MCPs and PIPs and DIPs with good range of motion with no synovitis.  Hip joints, knee joints, ankles, MTPs and PIPs with good range of motion with no  synovitis.  CDAI Exam: CDAI Score: -- Patient Global: --; Provider Global: -- Swollen: --; Tender: -- Joint Exam 11/13/2019   No joint exam has been documented for this visit   There is currently no information documented on the homunculus. Go to the Rheumatology activity and complete the homunculus joint exam.  Investigation: No additional findings.  Imaging: No results found.  Recent Labs: Lab Results  Component Value Date   WBC 8.1 08/31/2016   HGB 13.2 08/31/2016   PLT 304 08/31/2016   NA 140 04/17/2019   K 4.9 04/17/2019   CL 100 04/17/2019   CO2 27 04/17/2019   GLUCOSE 113 (H) 04/17/2019   BUN 10 04/17/2019   CREATININE 0.71 04/17/2019   BILITOT 0.4 04/17/2019   ALKPHOS 102 04/17/2019   AST 110 (H) 04/17/2019   ALT 117 (H) 04/17/2019   PROT 7.7 08/14/2019   ALBUMIN 4.4 04/17/2019   CALCIUM 9.5 04/17/2019   GFRAA 116 04/17/2019    Speciality Comments: No specialty comments available.  Procedures:  No procedures performed Allergies: Dairy aid [lactase], Gluten meal, Levaquin [levofloxacin in d5w], Topamax [topiramate], and Wheat bran   Assessment / Plan:     Visit Diagnoses: Positive ANA (antinuclear antibody) - 08/14/19: ANA 1:80H, 1:80speckled, ANA was low titer.  She has no clinical features of autoimmune disease.  She complains of some arthralgias.  I will obtain AVISE labs.  Vitamin B12 844, RF<10, CCP 8, IFE polyclonal increase detected in one or more immunoglobulins  Chronic pain of both knees -she complains of pain and discomfort in her bilateral knee joints especially when climbing stairs.  Plan: XR KNEE 3 VIEW RIGHT, XR KNEE 3 VIEW LEFT.  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  I have given her a handout on knee exercises.  DDD (degenerative disc disease), cervical-status post fusion.  DDD (degenerative disc disease), lumbar-she has had lumbar spine injections in the  past.  Fibromyalgia-patient believes that she does not have fibromyalgia.  Essential hypertension-her blood pressure is elevated today.  History of hyperlipidemia  NASH (nonalcoholic steatohepatitis)  History of IBS  History of gastroesophageal reflux (GERD)  Bilateral renal cysts  History of anxiety  Hx of migraines  Obstructive sleep apnea syndrome  Orders: Orders Placed This Encounter  Procedures  . XR KNEE 3 VIEW RIGHT  . XR KNEE 3 VIEW LEFT   No orders of the defined types were placed in this encounter.   Face-to-face time spent with patient was 45 minutes. Greater than 50% of time was spent in counseling and coordination of care.  Follow-Up Instructions: Return for +ANA.   Bo Merino, MD  Note - This record has been created using Editor, commissioning.  Chart creation errors have been sought, but may not always  have been located. Such creation errors do not reflect on  the standard of medical care.

## 2019-11-13 ENCOUNTER — Ambulatory Visit: Payer: 59 | Admitting: Rheumatology

## 2019-11-13 ENCOUNTER — Other Ambulatory Visit: Payer: Self-pay

## 2019-11-13 ENCOUNTER — Ambulatory Visit: Payer: Self-pay

## 2019-11-13 ENCOUNTER — Encounter: Payer: Self-pay | Admitting: Rheumatology

## 2019-11-13 VITALS — BP 142/92 | HR 95 | Resp 16 | Ht 65.0 in | Wt 212.0 lb

## 2019-11-13 DIAGNOSIS — M5030 Other cervical disc degeneration, unspecified cervical region: Secondary | ICD-10-CM | POA: Diagnosis not present

## 2019-11-13 DIAGNOSIS — M25561 Pain in right knee: Secondary | ICD-10-CM

## 2019-11-13 DIAGNOSIS — M25562 Pain in left knee: Secondary | ICD-10-CM | POA: Diagnosis not present

## 2019-11-13 DIAGNOSIS — Z8719 Personal history of other diseases of the digestive system: Secondary | ICD-10-CM

## 2019-11-13 DIAGNOSIS — R768 Other specified abnormal immunological findings in serum: Secondary | ICD-10-CM

## 2019-11-13 DIAGNOSIS — G4733 Obstructive sleep apnea (adult) (pediatric): Secondary | ICD-10-CM

## 2019-11-13 DIAGNOSIS — Z8659 Personal history of other mental and behavioral disorders: Secondary | ICD-10-CM

## 2019-11-13 DIAGNOSIS — Z8669 Personal history of other diseases of the nervous system and sense organs: Secondary | ICD-10-CM

## 2019-11-13 DIAGNOSIS — G8929 Other chronic pain: Secondary | ICD-10-CM

## 2019-11-13 DIAGNOSIS — M5136 Other intervertebral disc degeneration, lumbar region: Secondary | ICD-10-CM | POA: Diagnosis not present

## 2019-11-13 DIAGNOSIS — N281 Cyst of kidney, acquired: Secondary | ICD-10-CM

## 2019-11-13 DIAGNOSIS — Z8639 Personal history of other endocrine, nutritional and metabolic disease: Secondary | ICD-10-CM

## 2019-11-13 DIAGNOSIS — K7581 Nonalcoholic steatohepatitis (NASH): Secondary | ICD-10-CM

## 2019-11-13 DIAGNOSIS — I10 Essential (primary) hypertension: Secondary | ICD-10-CM

## 2019-11-13 DIAGNOSIS — M797 Fibromyalgia: Secondary | ICD-10-CM

## 2019-11-13 NOTE — Patient Instructions (Signed)
Journal for Nurse Practitioners, 15(4), 263-267. Retrieved April 30, 2018 from http://clinicalkey.com/nursing">  Knee Exercises Ask your health care provider which exercises are safe for you. Do exercises exactly as told by your health care provider and adjust them as directed. It is normal to feel mild stretching, pulling, tightness, or discomfort as you do these exercises. Stop right away if you feel sudden pain or your pain gets worse. Do not begin these exercises until told by your health care provider. Stretching and range-of-motion exercises These exercises warm up your muscles and joints and improve the movement and flexibility of your knee. These exercises also help to relieve pain and swelling. Knee extension, prone 1. Lie on your abdomen (prone position) on a bed. 2. Place your left / right knee just beyond the edge of the surface so your knee is not on the bed. You can put a towel under your left / right thigh just above your kneecap for comfort. 3. Relax your leg muscles and allow gravity to straighten your knee (extension). You should feel a stretch behind your left / right knee. 4. Hold this position for __________ seconds. 5. Scoot up so your knee is supported between repetitions. Repeat __________ times. Complete this exercise __________ times a day. Knee flexion, active  1. Lie on your back with both legs straight. If this causes back discomfort, bend your left / right knee so your foot is flat on the floor. 2. Slowly slide your left / right heel back toward your buttocks. Stop when you feel a gentle stretch in the front of your knee or thigh (flexion). 3. Hold this position for __________ seconds. 4. Slowly slide your left / right heel back to the starting position. Repeat __________ times. Complete this exercise __________ times a day. Quadriceps stretch, prone  1. Lie on your abdomen on a firm surface, such as a bed or padded floor. 2. Bend your left / right knee and hold  your ankle. If you cannot reach your ankle or pant leg, loop a belt around your foot and grab the belt instead. 3. Gently pull your heel toward your buttocks. Your knee should not slide out to the side. You should feel a stretch in the front of your thigh and knee (quadriceps). 4. Hold this position for __________ seconds. Repeat __________ times. Complete this exercise __________ times a day. Hamstring, supine 1. Lie on your back (supine position). 2. Loop a belt or towel over the ball of your left / right foot. The ball of your foot is on the walking surface, right under your toes. 3. Straighten your left / right knee and slowly pull on the belt to raise your leg until you feel a gentle stretch behind your knee (hamstring). ? Do not let your knee bend while you do this. ? Keep your other leg flat on the floor. 4. Hold this position for __________ seconds. Repeat __________ times. Complete this exercise __________ times a day. Strengthening exercises These exercises build strength and endurance in your knee. Endurance is the ability to use your muscles for a long time, even after they get tired. Quadriceps, isometric This exercise stretches the muscles in front of your thigh (quadriceps) without moving your knee joint (isometric). 1. Lie on your back with your left / right leg extended and your other knee bent. Put a rolled towel or small pillow under your knee if told by your health care provider. 2. Slowly tense the muscles in the front of your left /   right thigh. You should see your kneecap slide up toward your hip or see increased dimpling just above the knee. This motion will push the back of the knee toward the floor. 3. For __________ seconds, hold the muscle as tight as you can without increasing your pain. 4. Relax the muscles slowly and completely. Repeat __________ times. Complete this exercise __________ times a day. Straight leg raises This exercise stretches the muscles in front  of your thigh (quadriceps) and the muscles that move your hips (hip flexors). 1. Lie on your back with your left / right leg extended and your other knee bent. 2. Tense the muscles in the front of your left / right thigh. You should see your kneecap slide up or see increased dimpling just above the knee. Your thigh may even shake a bit. 3. Keep these muscles tight as you raise your leg 4-6 inches (10-15 cm) off the floor. Do not let your knee bend. 4. Hold this position for __________ seconds. 5. Keep these muscles tense as you lower your leg. 6. Relax your muscles slowly and completely after each repetition. Repeat __________ times. Complete this exercise __________ times a day. Hamstring, isometric 1. Lie on your back on a firm surface. 2. Bend your left / right knee about __________ degrees. 3. Dig your left / right heel into the surface as if you are trying to pull it toward your buttocks. Tighten the muscles in the back of your thighs (hamstring) to "dig" as hard as you can without increasing any pain. 4. Hold this position for __________ seconds. 5. Release the tension gradually and allow your muscles to relax completely for __________ seconds after each repetition. Repeat __________ times. Complete this exercise __________ times a day. Hamstring curls If told by your health care provider, do this exercise while wearing ankle weights. Begin with __________ lb weights. Then increase the weight by 1 lb (0.5 kg) increments. Do not wear ankle weights that are more than __________ lb. 1. Lie on your abdomen with your legs straight. 2. Bend your left / right knee as far as you can without feeling pain. Keep your hips flat against the floor. 3. Hold this position for __________ seconds. 4. Slowly lower your leg to the starting position. Repeat __________ times. Complete this exercise __________ times a day. Squats This exercise strengthens the muscles in front of your thigh and knee  (quadriceps). 1. Stand in front of a table, with your feet and knees pointing straight ahead. You may rest your hands on the table for balance but not for support. 2. Slowly bend your knees and lower your hips like you are going to sit in a chair. ? Keep your weight over your heels, not over your toes. ? Keep your lower legs upright so they are parallel with the table legs. ? Do not let your hips go lower than your knees. ? Do not bend lower than told by your health care provider. ? If your knee pain increases, do not bend as low. 3. Hold the squat position for __________ seconds. 4. Slowly push with your legs to return to standing. Do not use your hands to pull yourself to standing. Repeat __________ times. Complete this exercise __________ times a day. Wall slides This exercise strengthens the muscles in front of your thigh and knee (quadriceps). 1. Lean your back against a smooth wall or door, and walk your feet out 18-24 inches (46-61 cm) from it. 2. Place your feet hip-width apart. 3.   Slowly slide down the wall or door until your knees bend __________ degrees. Keep your knees over your heels, not over your toes. Keep your knees in line with your hips. 4. Hold this position for __________ seconds. Repeat __________ times. Complete this exercise __________ times a day. Straight leg raises This exercise strengthens the muscles that rotate the leg at the hip and move it away from your body (hip abductors). 1. Lie on your side with your left / right leg in the top position. Lie so your head, shoulder, knee, and hip line up. You may bend your bottom knee to help you keep your balance. 2. Roll your hips slightly forward so your hips are stacked directly over each other and your left / right knee is facing forward. 3. Leading with your heel, lift your top leg 4-6 inches (10-15 cm). You should feel the muscles in your outer hip lifting. ? Do not let your foot drift forward. ? Do not let your knee  roll toward the ceiling. 4. Hold this position for __________ seconds. 5. Slowly return your leg to the starting position. 6. Let your muscles relax completely after each repetition. Repeat __________ times. Complete this exercise __________ times a day. Straight leg raises This exercise stretches the muscles that move your hips away from the front of the pelvis (hip extensors). 1. Lie on your abdomen on a firm surface. You can put a pillow under your hips if that is more comfortable. 2. Tense the muscles in your buttocks and lift your left / right leg about 4-6 inches (10-15 cm). Keep your knee straight as you lift your leg. 3. Hold this position for __________ seconds. 4. Slowly lower your leg to the starting position. 5. Let your leg relax completely after each repetition. Repeat __________ times. Complete this exercise __________ times a day. This information is not intended to replace advice given to you by your health care provider. Make sure you discuss any questions you have with your health care provider. Document Revised: 05/01/2018 Document Reviewed: 05/01/2018 Elsevier Patient Education  2020 [employer]  

## 2019-11-14 ENCOUNTER — Encounter: Payer: Self-pay | Admitting: Rheumatology

## 2019-11-20 ENCOUNTER — Other Ambulatory Visit: Payer: Self-pay

## 2019-11-20 ENCOUNTER — Encounter: Payer: Self-pay | Admitting: Family Medicine

## 2019-11-20 ENCOUNTER — Ambulatory Visit: Payer: 59 | Admitting: Family Medicine

## 2019-11-20 VITALS — BP 128/88 | Temp 97.9°F | Wt 211.4 lb

## 2019-11-20 DIAGNOSIS — R7303 Prediabetes: Secondary | ICD-10-CM

## 2019-11-20 DIAGNOSIS — R1907 Generalized intra-abdominal and pelvic swelling, mass and lump: Secondary | ICD-10-CM | POA: Diagnosis not present

## 2019-11-20 DIAGNOSIS — R5383 Other fatigue: Secondary | ICD-10-CM | POA: Diagnosis not present

## 2019-11-20 DIAGNOSIS — R61 Generalized hyperhidrosis: Secondary | ICD-10-CM

## 2019-11-20 DIAGNOSIS — R0600 Dyspnea, unspecified: Secondary | ICD-10-CM | POA: Diagnosis not present

## 2019-11-20 DIAGNOSIS — R0609 Other forms of dyspnea: Secondary | ICD-10-CM

## 2019-11-20 DIAGNOSIS — R635 Abnormal weight gain: Secondary | ICD-10-CM

## 2019-11-20 DIAGNOSIS — E785 Hyperlipidemia, unspecified: Secondary | ICD-10-CM

## 2019-11-20 NOTE — Progress Notes (Signed)
   Subjective:    Patient ID: [name], female    DOB: [birth year], 49 y.o.   MRN: [id number]  HPI Patient comes in today to discuss weight gain. Patient concerned about weight gain She gets short of breath with activity Gaining weight in the abdomen and chest States she feels pressure in her abdomen She had a hysterectomy in the past took both ovaries to the best of her knowledge Patient also states energy level is low.  Having a lot of sweats but denies chills or fevers States she does try to eat relatively healthy  No obvious lifestyle changes.    Review of Systems  Constitutional: Negative for activity change, appetite change and fatigue.  HENT: Negative for congestion and rhinorrhea.   Respiratory: Positive for shortness of breath. Negative for cough.   Cardiovascular: Positive for leg swelling. Negative for chest pain.  Gastrointestinal: Negative for abdominal pain and diarrhea.  Endocrine: Negative for polydipsia and polyphagia.  Skin: Negative for color change.  Neurological: Negative for dizziness and weakness.  Psychiatric/Behavioral: Negative for behavioral problems and confusion.       Objective:   Physical Exam Vitals reviewed.  Constitutional:      General: She is not in acute distress. HENT:     Head: Normocephalic and atraumatic.  Eyes:     General:        Right eye: No discharge.        Left eye: No discharge.  Neck:     Trachea: No tracheal deviation.  Cardiovascular:     Rate and Rhythm: Normal rate and regular rhythm.     Heart sounds: Normal heart sounds. No murmur.  Pulmonary:     Effort: Pulmonary effort is normal. No respiratory distress.     Breath sounds: Normal breath sounds.  Lymphadenopathy:     Cervical: No cervical adenopathy.  Skin:    General: Skin is warm and dry.  Neurological:     Mental Status: She is alert.     Coordination: Coordination normal.  Psychiatric:        Behavior: Behavior normal.             Assessment & Plan:  1. Excessive sweating This could be hormonal.  Will also be checking thyroid function. - PR ELECTROCARDIOGRAM, COMPLETE - DG Chest 2 View - CBC with Differential/Platelet - Basic metabolic panel - TSH - FSH/LH - CA 125 - B Nat Peptide  2. Weight gain Significant weight gain encourage her with healthy diet regular physical activity.  In addition to this we will do ultrasound of the abdomen because of family history of ovarian cancer plus also her feeling abdominal bloating and swelling. - TSH - FSH/LH - CA 125 - B Nat Peptide - US Abdomen Complete  3. Abdominal swelling, generalized Ultrasound indicated. - PR ELECTROCARDIOGRAM, COMPLETE - DG Chest 2 View - CA 125 - B Nat Peptide - US Abdomen Complete  4. Other fatigue Significant fatigue tiredness we will check lab work await the results of this - Basic metabolic panel - TSH - FSH/LH - CA 125 - B Nat Peptide  5. DOE (dyspnea on exertion) Shortness of breath BNP await the results of this plus also EKG no acute ST segment changes - PR ELECTROCARDIOGRAM, COMPLETE - DG Chest 2 View - Basic metabolic panel  6. Prediabetes Check [name] watch starches in diet watch portions - Hemoglobin [name]  7. Hyperlipidemia, unspecified hyperlipidemia type Check lab work await results - Lipid panel

## 2019-11-21 LAB — BASIC METABOLIC PANEL
BUN/Creatinine Ratio: 17 (ref 9–23)
BUN: 17 mg/dL (ref 6–24)
CO2: 21 mmol/L (ref 20–29)
Calcium: 9.9 mg/dL (ref 8.7–10.2)
Chloride: 99 mmol/L (ref 96–106)
Creatinine, Ser: 0.98 mg/dL (ref 0.57–1.00)
GFR calc Af Amer: 78 mL/min/{1.73_m2} (ref >59)
GFR calc non Af Amer: 67 mL/min/{1.73_m2} (ref >59)
Glucose: 98 mg/dL (ref 65–99)
Potassium: 5 mmol/L (ref 3.5–5.2)
Sodium: 139 mmol/L (ref 134–144)

## 2019-11-21 LAB — CBC WITH DIFFERENTIAL/PLATELET
Basophils Absolute: 0 10*3/uL (ref 0.0–0.2)
Basos: 0 %
EOS (ABSOLUTE): 0.1 10*3/uL (ref 0.0–0.4)
Eos: 2 %
Hematocrit: 42.2 % (ref 34.0–46.6)
Hemoglobin: 14.3 g/dL (ref 11.1–15.9)
Immature Grans (Abs): 0 10*3/uL (ref 0.0–0.1)
Immature Granulocytes: 0 %
Lymphocytes Absolute: 3.5 10*3/uL — ABNORMAL HIGH (ref 0.7–3.1)
Lymphs: 37 %
MCH: 33.1 pg — ABNORMAL HIGH (ref 26.6–33.0)
MCHC: 33.9 g/dL (ref 31.5–35.7)
MCV: 98 fL — ABNORMAL HIGH (ref 79–97)
Monocytes Absolute: 0.7 10*3/uL (ref 0.1–0.9)
Monocytes: 8 %
Neutrophils Absolute: 5 10*3/uL (ref 1.4–7.0)
Neutrophils: 53 %
Platelets: 303 10*3/uL (ref 150–450)
RBC: 4.32 x10E6/uL (ref 3.77–5.28)
RDW: 12.4 % (ref 11.7–15.4)
WBC: 9.5 10*3/uL (ref 3.4–10.8)

## 2019-11-21 LAB — CA 125: Cancer Antigen (CA) 125: 6.7 U/mL (ref 0.0–38.1)

## 2019-11-21 LAB — LIPID PANEL
Chol/HDL Ratio: 3.9 ratio (ref 0.0–4.4)
Cholesterol, Total: 249 mg/dL — ABNORMAL HIGH (ref 100–199)
HDL: 64 mg/dL (ref >39)
LDL Chol Calc (NIH): 150 mg/dL — ABNORMAL HIGH (ref 0–99)
Triglycerides: 195 mg/dL — ABNORMAL HIGH (ref 0–149)
VLDL Cholesterol Cal: 35 mg/dL (ref 5–40)

## 2019-11-21 LAB — FSH/LH
FSH: 27.7 m[IU]/mL
LH: 22.8 m[IU]/mL

## 2019-11-21 LAB — BRAIN NATRIURETIC PEPTIDE: BNP: 8.3 pg/mL (ref 0.0–100.0)

## 2019-11-21 LAB — TSH: TSH: 1.71 u[IU]/mL (ref 0.450–4.500)

## 2019-11-21 LAB — HEMOGLOBIN A1C
Est. average glucose Bld gHb Est-mCnc: 137 mg/dL
Hgb A1c MFr Bld: 6.4 % — ABNORMAL HIGH (ref 4.8–5.6)

## 2019-11-22 ENCOUNTER — Other Ambulatory Visit: Payer: Self-pay

## 2019-11-22 ENCOUNTER — Ambulatory Visit (HOSPITAL_COMMUNITY)
Admission: RE | Admit: 2019-11-22 | Discharge: 2019-11-22 | Disposition: A | Discharge status: Home / Self Care | Payer: 59 | Source: Ambulatory Visit | Attending: Family Medicine | Admitting: Family Medicine

## 2019-11-22 ENCOUNTER — Other Ambulatory Visit: Payer: Self-pay | Admitting: *Deleted

## 2019-11-22 DIAGNOSIS — R1907 Generalized intra-abdominal and pelvic swelling, mass and lump: Secondary | ICD-10-CM | POA: Diagnosis present

## 2019-11-22 DIAGNOSIS — R635 Abnormal weight gain: Secondary | ICD-10-CM | POA: Insufficient documentation

## 2019-11-22 DIAGNOSIS — E119 Type 2 diabetes mellitus without complications: Secondary | ICD-10-CM

## 2019-11-22 DIAGNOSIS — E785 Hyperlipidemia, unspecified: Secondary | ICD-10-CM

## 2019-11-22 DIAGNOSIS — Z79899 Other long term (current) drug therapy: Secondary | ICD-10-CM

## 2019-11-22 DIAGNOSIS — R61 Generalized hyperhidrosis: Secondary | ICD-10-CM | POA: Insufficient documentation

## 2019-11-22 DIAGNOSIS — R0600 Dyspnea, unspecified: Secondary | ICD-10-CM | POA: Insufficient documentation

## 2019-11-22 MED ORDER — BLOOD GLUCOSE METER KIT: PACK | 1 refills | Status: DC

## 2019-11-22 MED ORDER — ROSUVASTATIN CALCIUM 10 MG PO TABS: 10.0000 mg | ORAL_TABLET | Freq: Every day | ORAL | 1 refills | Status: DC

## 2019-11-22 MED ORDER — METFORMIN HCL 500 MG PO TABS: 500.0000 mg | ORAL_TABLET | Freq: Two times a day (BID) | ORAL | 1 refills | Status: DC

## 2019-11-23 DIAGNOSIS — I5189 Other ill-defined heart diseases: Secondary | ICD-10-CM

## 2019-11-23 HISTORY — DX: Other ill-defined heart diseases: I51.89

## 2019-11-25 ENCOUNTER — Other Ambulatory Visit: Payer: Self-pay | Admitting: *Deleted

## 2019-11-25 DIAGNOSIS — R0609 Other forms of dyspnea: Secondary | ICD-10-CM

## 2019-11-26 ENCOUNTER — Encounter: Payer: Self-pay | Admitting: Family Medicine

## 2019-12-02 ENCOUNTER — Other Ambulatory Visit: Payer: Self-pay | Admitting: *Deleted

## 2019-12-02 ENCOUNTER — Other Ambulatory Visit: Payer: Self-pay

## 2019-12-02 ENCOUNTER — Encounter: Payer: Self-pay | Admitting: Family Medicine

## 2019-12-02 ENCOUNTER — Ambulatory Visit (HOSPITAL_COMMUNITY)
Admission: RE | Admit: 2019-12-02 | Discharge: 2019-12-02 | Disposition: A | Discharge status: Home / Self Care | Payer: 59 | Source: Ambulatory Visit | Attending: Family Medicine | Admitting: Family Medicine

## 2019-12-02 DIAGNOSIS — R0609 Other forms of dyspnea: Secondary | ICD-10-CM

## 2019-12-02 DIAGNOSIS — R0600 Dyspnea, unspecified: Secondary | ICD-10-CM | POA: Insufficient documentation

## 2019-12-02 NOTE — Progress Notes (Signed)
*  PRELIMINARY RESULTS* Echocardiogram 2D Echocardiogram has been performed.  Samuel Germany 12/02/2019, 1:59 PM

## 2019-12-02 NOTE — Progress Notes (Signed)
Office Visit Note  Patient: [name]             Date of Birth: March 09, 1971           MRN: [id number]             PCP: Kathyrn Drown, MD Referring: Kathyrn Drown, MD Visit Date: 12/09/2019 Occupation: @GUAROCC @  Subjective:  Discomfort in knees and ankles.     History of Present Illness: [name] is a 49 y.o. female with history of positive ANA and osteoarthritis.  She states she continues to have some discomfort in her knee joints.  She has not noticed any joint swelling.  She also feels that her ankle joints are unstable and she trips often.  She does not have very good balance.  She continues to have some discomfort in her cervical and lumbar spine.  She also has fibromyalgia which causes discomfort.  She denies any history of oral ulcers, nasal ulcers.  She has dry mouth.  There is no history of Raynaud's phenomenon photosensitivity or lymphadenopathy.  There is no history of joint swelling.  She states she does have some fluid retention.  Her echocardiogram recently showed diastolic dysfunction.  Activities of Daily Living:  Patient reports morning stiffness for 10  minutes.   Patient Reports nocturnal pain.  Difficulty dressing/grooming: Denies Difficulty climbing stairs: Reports Difficulty getting out of chair: Reports Difficulty using hands for taps, buttons, cutlery, and/or writing: Denies  Review of Systems  Constitutional: Positive for fatigue. Negative for night sweats, weight gain and weight loss.  HENT: Positive for mouth dryness. Negative for mouth sores, trouble swallowing, trouble swallowing and nose dryness.   Eyes: Negative for pain, redness, itching, visual disturbance and dryness.  Respiratory: Positive for shortness of breath. Negative for cough and difficulty breathing.   Cardiovascular: Positive for swelling in legs/feet. Negative for chest pain, palpitations, hypertension and irregular heartbeat.  Gastrointestinal: Positive for  constipation and diarrhea. Negative for blood in stool.  Endocrine: Negative for increased urination.  Genitourinary: Negative for difficulty urinating and vaginal dryness.  Musculoskeletal: Positive for arthralgias, joint pain and morning stiffness. Negative for joint swelling, myalgias, muscle weakness, muscle tenderness and myalgias.  Skin: Positive for rash and redness. Negative for color change, hair loss, skin tightness, ulcers and sensitivity to sunlight.  Allergic/Immunologic: Negative for susceptible to infections.  Neurological: Positive for headaches and weakness. Negative for dizziness, numbness, memory loss and night sweats.  Hematological: Positive for bruising/bleeding tendency. Negative for swollen glands.  Psychiatric/Behavioral: Negative for depressed mood, confusion and sleep disturbance. The patient is not nervous/anxious.     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[place]) 07/04/2016   Fibromyalgia 07/07/2015   Obesity, unspecified 05/09/2013   Bilateral renal cysts 05/09/2013   Dyspareunia 02/25/2013   Hyperglycemia 11/07/2012   Other malaise and fatigue 11/07/2012   Anxiety state 04/22/2010   GERD 04/22/2010   ILEUS 04/22/2010   IRRITABLE BOWEL SYNDROME 04/22/2010   Sleep apnea 04/22/2010   LIVER FUNCTION TESTS, ABNORMAL, HX OF 04/22/2010   Hyperlipidemia 12/03/2009   Essential hypertension 12/03/2009   NASH (nonalcoholic steatohepatitis) 12/03/2009   CHEST PAIN UNSPECIFIED 12/03/2009    Past Medical History:  Diagnosis Date   Allergy    Anemia    hx   Anxiety    takes Klonopin daily as needed.takes Celexa  daily   Arthritis    left foot and hands   Asthma    exercise induced   Complication of anesthesia    slow to wake  up   Diastolic dysfunction 38/3338   per patient     High cholesterol    takes Pravastatin daily   History of bronchitis    several yrs ago   History of migraine    last one about 2 wks ago   Hypertension    IBS (irritable bowel syndrome)    takes Trulance and ProBiotic Daily   Inflammation of hair follicles    takes Minocycline daily   Insomnia    takes Melatonin nightly   Liver fibrosis    Migraine    NASH (nonalcoholic steatohepatitis)    Sleep apnea    sleep study > 5 yrs ago. Uses CPAP   Spastic bladder    takes Myrbetriq daily   Tingling    r/t neck. Pseudoarthrosis    UTI (urinary tract infection)    hx of but takes Macrodantin daily. has been on for 2 1/2 yrs     Family History  Problem Relation Age of Onset   Cirrhosis Father        alcohol abuse   Diabetes Father    Hypertension Father    Alcoholism Father    Ovarian cancer Mother    Hypertension Mother    Irritable bowel syndrome Mother    Neuropathy Mother    Colon cancer Maternal Grandmother    CAD Maternal Grandfather    Drug abuse Brother    Colon polyps Neg Hx    Esophageal cancer Neg Hx    Rectal cancer Neg Hx    Stomach cancer Neg Hx    Past Surgical History:  Procedure Laterality Date   ABDOMINAL HYSTERECTOMY  2005   complete hysterectomy both ovaries removed   BREAST REDUCTION SURGERY  2009   CARPAL TUNNEL RELEASE     carpel tunnel Bilateral 02/2017, 2021   CERVICAL [place]  2007, 2009, 2014, 2017   CESAREAN SECTION  94/03   x 2   COLONOSCOPY     ESOPHAGOGASTRODUODENOSCOPY     POSTERIOR CERVICAL FUSION/FORAMINOTOMY N/A 01/07/2013   Procedure: POSTERIOR CERVICAL FUSION/FORAMINOTOMY LEVEL 2;  Surgeon: Hosie Spangle, MD;  Location: [facility];  Service: Neurosurgery;  Laterality: N/A;  Posterior Cervical Five-Six/Six-Seven Arthrodesis with Instrumentation   POSTERIOR CERVICAL FUSION/FORAMINOTOMY N/A 07/04/2016   Procedure: CERVICAL THREE-FOUR POSTERIOR CERVICAL ARTHRODESIS;  Surgeon: Jovita Gamma, MD;  Location: [facility];  Service: Neurosurgery;  Laterality: N/A;  C3-C4 POSTERIOR CERVICAL ARTHRODESIS   tummy tuck     Social History   Social History Narrative   Lives with family   Caffeine- 2 daily   Immunization History  Administered Date(s) Administered   Hep A / Hep B 03/15/2016, 04/14/2016, 09/19/2016   Influenza Split 05/09/2013   Influenza,inj,Quad PF,6+ Mos 05/19/2014, 06/01/2015, 05/19/2017   Influenza-Unspecified 06/24/2010   Td 03/25/2012, 10/31/2012     Objective: Vital Signs: BP (!) 145/98 (BP Location: Left Arm, Patient Position: Sitting, Cuff Size: Normal)    Pulse 78    Resp 15    Ht 5' 5"  (1.651 m)    Wt 214 lb 6.4 oz (97.3 kg)    BMI 35.68 kg/m    Physical Exam Vitals and nursing note reviewed.  Constitutional:      Appearance: She is well-developed.  HENT:     Head: Normocephalic and atraumatic.  Eyes:  Conjunctiva/sclera: Conjunctivae normal.  Cardiovascular:     Rate and Rhythm: Normal rate and regular rhythm.     Heart sounds: Normal heart sounds.  Pulmonary:     Effort: Pulmonary effort is normal.     Breath sounds: Normal breath sounds.  Abdominal:     General: Bowel sounds are normal.     Palpations: Abdomen is soft.  Musculoskeletal:     Cervical back: Normal range of motion.  Lymphadenopathy:     Cervical: No cervical adenopathy.  Skin:    General: Skin is warm and dry.     Capillary Refill: Capillary refill takes less than 2 seconds.  Neurological:     Mental Status: She is alert and oriented to person, place, and time.  Psychiatric:        Behavior: Behavior normal.      Musculoskeletal Exam: C-spine and thoracic and lumbar spine had good range of motion.  Shoulder joints, elbow joints, wrist joints with good range of motion.  She has mild DIP and PIP thickening.  Hip joints, knee joints, ankles, MTPs and PIPs with good range of motion with no synovitis.  She has generalized hyperalgesia and positive tender  points. CDAI Exam: CDAI Score: -- Patient Global: --; Provider Global: -- Swollen: --; Tender: -- Joint Exam 12/09/2019   No joint exam has been documented for this visit   There is currently no information documented on the homunculus. Go to the Rheumatology activity and complete the homunculus joint exam.  Investigation: No additional findings.  Imaging: DG Chest 2 View  Result Date: 11/22/2019 CLINICAL DATA:  Dyspnea on exertion EXAM: CHEST - 2 VIEW COMPARISON:  12/31/2012 FINDINGS: The heart size and mediastinal contours are within normal limits. Both lungs are clear. The visualized skeletal structures are unremarkable. IMPRESSION: No acute abnormality of the lungs. Electronically Signed   By: Eddie Candle M.D.   On: 11/22/2019 15:21   US Abdomen Complete  Result Date: 11/22/2019 CLINICAL DATA:  Unintentional weight gain. EXAM: ABDOMEN ULTRASOUND COMPLETE COMPARISON:  08/08/2016 FINDINGS: Gallbladder: No gallstones or wall thickening visualized. No sonographic Murphy sign noted by sonographer. Common bile duct: Diameter: 4 mm, within normal limits. Liver: Diffusely increased echogenicity of the hepatic parenchyma, consistent with hepatic steatosis. No hepatic mass identified. Portal vein is patent on color Doppler imaging with normal direction of blood flow towards the liver. IVC: No abnormality visualized. Pancreas: Visualized portion unremarkable. Spleen: Size and appearance within normal limits. Right Kidney: Length: 10.0 cm. Echogenicity within normal limits. No mass or hydronephrosis visualized. Left Kidney: Length: 11.9 cm. Echogenicity within normal limits. Small cysts are again seen in the mid and lower pole with mild complexity, but no significant change compared to prior exam. No mass or hydronephrosis visualized. Abdominal aorta: No aneurysm visualized. Other findings: None. IMPRESSION: No acute findings. Diffuse hepatic steatosis. Electronically Signed   By: Marlaine Hind M.D.    On: 11/22/2019 15:38   ECHOCARDIOGRAM COMPLETE  Result Date: 12/06/2019    ECHOCARDIOGRAM REPORT   Patient Name:   YANIA BOGIE Date of Exam: 12/02/2019 Medical Rec #:  [id number]          Height:       65.0 in Accession #:    [id number]         Weight:       211.4 lb Date of Birth:  [birth year]         BSA:          2.025 m Patient  Age:    1 years           BP:           142/94 mmHg Patient Gender: F                  HR:           93 bpm. Exam Location:  [facility] Procedure: 2D Echo, Cardiac Doppler and Color Doppler Indications:     R06.00 (ICD-10-CM) - DOE (dyspnea on exertion)  History:         Patient has prior history of Echocardiogram examinations, most                  recent 02/02/2010. Risk Factors:Hypertension and Dyslipidemia.                  Sleep apnea.  Sonographer:     Alvino Chapel RCS Referring Phys:  [phone] Kathyrn Drown Diagnosing Phys: Kate Sable MD IMPRESSIONS  1. Left ventricular ejection fraction, by estimation, is 60 to 65%. The left ventricle has normal function. The left ventricle has no regional wall motion abnormalities. Left ventricular diastolic parameters are consistent with Grade I diastolic dysfunction (impaired relaxation).  2. Right ventricular systolic function is normal. The right ventricular size is normal.  3. The mitral valve is grossly normal. Trivial mitral valve regurgitation.  4. The aortic valve has an indeterminant number of cusps. Aortic valve regurgitation is not visualized. No aortic stenosis is present.  5. The inferior vena cava is normal in size with greater than 50% respiratory variability, suggesting right atrial pressure of 3 mmHg. FINDINGS  Left Ventricle: Left ventricular ejection fraction, by estimation, is 60 to 65%. The left ventricle has normal function. The left ventricle has no regional wall motion abnormalities. The left ventricular internal cavity size was normal in size. There is  borderline concentric left ventricular hypertrophy.  Left ventricular diastolic parameters are consistent with Grade I diastolic dysfunction (impaired relaxation). Normal left ventricular filling pressure. Right Ventricle: The right ventricular size is normal. No increase in right ventricular wall thickness. Right ventricular systolic function is normal. Left Atrium: Left atrial size was normal in size. Right Atrium: Right atrial size was normal in size. Pericardium: There is no evidence of pericardial effusion. Mitral Valve: The mitral valve is grossly normal. Trivial mitral valve regurgitation. Tricuspid Valve: The tricuspid valve is grossly normal. Tricuspid valve regurgitation is not demonstrated. Aortic Valve: The aortic valve has an indeterminant number of cusps. Aortic valve regurgitation is not visualized. No aortic stenosis is present. Pulmonic Valve: The pulmonic valve was grossly normal. Pulmonic valve regurgitation is not visualized. Aorta: The aortic root is normal in size and structure. Venous: The inferior vena cava is normal in size with greater than 50% respiratory variability, suggesting right atrial pressure of 3 mmHg. IAS/Shunts: No atrial level shunt detected by color flow Doppler.  LEFT VENTRICLE PLAX 2D LVIDd:         4.28 cm  Diastology LVIDs:         2.58 cm  LV e' lateral:   7.94 cm/s LV PW:         1.00 cm  LV E/e' lateral: 5.8 LV IVS:        0.94 cm  LV e' medial:    8.05 cm/s LVOT diam:     1.90 cm  LV E/e' medial:  5.7 LV SV:         72 LV SV Index:  35 LVOT Area:     2.84 cm  RIGHT VENTRICLE RV S prime:     18.20 cm/s TAPSE (M-mode): 2.4 cm LEFT ATRIUM             Index       RIGHT ATRIUM           Index LA diam:        3.90 cm 1.93 cm/m  RA Area:     13.50 cm LA Vol (A2C):   27.3 ml 13.48 ml/m RA Volume:   29.10 ml  14.37 ml/m LA Vol (A4C):   36.4 ml 17.97 ml/m LA Biplane Vol: 31.8 ml 15.70 ml/m  AORTIC VALVE LVOT Vmax:   141.00 cm/s LVOT Vmean:  93.900 cm/s LVOT VTI:    0.253 m  AORTA Ao Root diam: 3.20 cm MITRAL VALVE MV Area  (PHT): 2.51 cm    SHUNTS MV Decel Time: 303 msec    Systemic VTI:  0.25 m MV E velocity: 46.01 cm/s  Systemic Diam: 1.90 cm MV A velocity: 89.05 cm/s MV E/A ratio:  0.52 Kate Sable MD Electronically signed by Kate Sable MD Signature Date/Time: 12/02/2019/2:27:10 PM    Final (Updated)    ECHOCARDIOGRAM COMPLETE BUBBLE STUDY  Result Date: 12/02/2019    ECHOCARDIOGRAM REPORT   Patient Name:   TERRELL SHIMKO Date of Exam: 12/02/2019 Medical Rec #:  [id number]          Height:       65.0 in Accession #:    [id number]         Weight:       211.4 lb Date of Birth:  [birth year]         BSA:          2.025 m Patient Age:    38 years           BP:           142/94 mmHg Patient Gender: F                  HR:           93 bpm. Exam Location:  [facility] Procedure: 2D Echo, Cardiac Doppler and Color Doppler Indications:    R06.00 (ICD-10-CM) - DOE (dyspnea on exertion)  History:        Patient has prior history of Echocardiogram examinations, most                 recent 02/02/2010. Risk Factors:Hypertension and Dyslipidemia.                 Sleep apnea.  Sonographer:    Alvino Chapel RCS Referring Phys: [phone] SCOTT A [place]  1. Left ventricular ejection fraction, by estimation, is 60 to 65%. The left ventricle has normal function. The left ventricle has no regional wall motion abnormalities. Left ventricular diastolic parameters are consistent with Grade I diastolic dysfunction (impaired relaxation).  2. Right ventricular systolic function is normal. The right ventricular size is normal.  3. The mitral valve is grossly normal. Trivial mitral valve regurgitation.  4. The aortic valve has an indeterminant number of cusps. Aortic valve regurgitation is not visualized. No aortic stenosis is present.  5. The inferior vena cava is normal in size with greater than 50% respiratory variability, suggesting right atrial pressure of 3 mmHg. FINDINGS  Left Ventricle: Left ventricular ejection fraction, by  estimation, is 60 to 65%. The left ventricle has normal function. The left ventricle  has no regional wall motion abnormalities. The left ventricular internal cavity size was normal in size. There is  borderline concentric left ventricular hypertrophy. Left ventricular diastolic parameters are consistent with Grade I diastolic dysfunction (impaired relaxation). Normal left ventricular filling pressure. Right Ventricle: The right ventricular size is normal. No increase in right ventricular wall thickness. Right ventricular systolic function is normal. Left Atrium: Left atrial size was normal in size. Right Atrium: Right atrial size was normal in size. Pericardium: There is no evidence of pericardial effusion. Mitral Valve: The mitral valve is grossly normal. Trivial mitral valve regurgitation. Tricuspid Valve: The tricuspid valve is grossly normal. Tricuspid valve regurgitation is not demonstrated. Aortic Valve: The aortic valve has an indeterminant number of cusps. Aortic valve regurgitation is not visualized. No aortic stenosis is present. Pulmonic Valve: The pulmonic valve was grossly normal. Pulmonic valve regurgitation is not visualized. Aorta: The aortic root is normal in size and structure. Venous: The inferior vena cava is normal in size with greater than 50% respiratory variability, suggesting right atrial pressure of 3 mmHg. IAS/Shunts: No atrial level shunt detected by color flow Doppler.  LEFT VENTRICLE PLAX 2D LVIDd:         4.28 cm  Diastology LVIDs:         2.58 cm  LV e' lateral:   7.94 cm/s LV PW:         1.00 cm  LV E/e' lateral: 5.8 LV IVS:        0.94 cm  LV e' medial:    8.05 cm/s LVOT diam:     1.90 cm  LV E/e' medial:  5.7 LV SV:         72 LV SV Index:   35 LVOT Area:     2.84 cm  RIGHT VENTRICLE RV S prime:     18.20 cm/s TAPSE (M-mode): 2.4 cm LEFT ATRIUM             Index       RIGHT ATRIUM           Index LA diam:        3.90 cm 1.93 cm/m  RA Area:     13.50 cm LA Vol (A2C):   27.3 ml  13.48 ml/m RA Volume:   29.10 ml  14.37 ml/m LA Vol (A4C):   36.4 ml 17.97 ml/m LA Biplane Vol: 31.8 ml 15.70 ml/m  AORTIC VALVE LVOT Vmax:   141.00 cm/s LVOT Vmean:  93.900 cm/s LVOT VTI:    0.253 m  AORTA Ao Root diam: 3.20 cm MITRAL VALVE MV Area (PHT): 2.51 cm    SHUNTS MV Decel Time: 303 msec    Systemic VTI:  0.25 m MV E velocity: 46.01 cm/s  Systemic Diam: 1.90 cm MV A velocity: 89.05 cm/s MV E/A ratio:  0.52 Kate Sable MD Electronically signed by Kate Sable MD Signature Date/Time: 12/02/2019/2:27:10 PM    Final    XR KNEE 3 VIEW LEFT  Result Date: 11/13/2019 Moderate medial compartment narrowing was noted.  Moderate patellofemoral narrowing was noted.  No chondrocalcinosis was noted. Impression: These findings are consistent with moderate osteoarthritis and moderate chondromalacia patella.  XR KNEE 3 VIEW RIGHT  Result Date: 11/13/2019 Moderate medial compartment narrowing was noted.  Moderate patellofemoral narrowing was noted.  No chondrocalcinosis was noted. Impression: These findings are consistent with moderate osteoarthritis and moderate chondromalacia patella.   Recent Labs: Lab Results  Component Value Date   WBC 9.5 11/20/2019   HGB  14.3 11/20/2019   PLT 303 11/20/2019   NA 139 11/20/2019   K 5.0 11/20/2019   CL 99 11/20/2019   CO2 21 11/20/2019   GLUCOSE 98 11/20/2019   BUN 17 11/20/2019   CREATININE 0.98 11/20/2019   BILITOT 0.4 04/17/2019   ALKPHOS 102 04/17/2019   AST 110 (H) 04/17/2019   ALT 117 (H) 04/17/2019   PROT 7.7 08/14/2019   ALBUMIN 4.4 04/17/2019   CALCIUM 9.9 11/20/2019   GFRAA 78 11/20/2019   November 14, 2019 AVISE ANA 1: 320NS, ENA negative, Jo 1 -, CB CAP negative, anticardiolipin negative, beta-2 GP 1 -, antiphosphatidylserine negative, antihistone negative, RF negative, anti-CCP negative, anticar P- antithyroglobulin negative, anti-TPO negative Speciality Comments: No specialty comments available.  Procedures:  No procedures  performed Allergies: Dairy aid [lactase], Gluten meal, Levaquin [levofloxacin in d5w], Topamax [topiramate], and Wheat bran   Assessment / Plan:     Visit Diagnoses: Positive ANA (antinuclear antibody) - AVISE lupus index -2.2, ANA 1: 320NS(ENA, CB CAP, cardiolipins negative).  I detailed discussion regarding the lab work with the patient.  She has positive ANA but all other antibodies were negative.  Rheumatoid factor and anti-CCP were negative.  Anticardiolipin's were also negative.  She does not have any clinical features of autoimmune disease on my examination today.  Have advised her to contact me in case she develops any new symptoms.  Signs and symptoms of autoimmune disease were discussed.  Primary osteoarthritis of both knees - Bilateral moderate osteoarthritis and moderate chondromalacia patella.  She has chronic discomfort in her knee joints.  Weight loss diet and exercise was discussed.  A handout on knee joint muscle strengthening exercises was given.  Natural anti-inflammatories were discussed.  We also discussed if she has persistent symptoms may consider Visco supplement injections in the future.  Water aerobics were also discussed.  Poor balance-patient states her balance is not very good.  Have demonstrated some of the balancing exercises in the office.  DDD (degenerative disc disease), cervical-chronic discomfort.  DDD (degenerative disc disease), lumbar-chronic discomfort.  Fibromyalgia-she continues to have some generalized pain and discomfort.  Essential hypertension-blood pressure is a still elevated.  Diastolic dysfunction - Grade 1  History of hyperlipidemia-her BMI is 35.68.  Weight loss diet and exercise was emphasized.  History of IBS  History of gastroesophageal reflux (GERD)  NASH (nonalcoholic steatohepatitis)  Bilateral renal cysts  History of anxiety  Hx of migraines  Obstructive sleep apnea syndrome  Orders: No orders of the defined types were  placed in this encounter.  No orders of the defined types were placed in this encounter.    Follow-Up Instructions: Return if symptoms worsen or fail to improve, for Osteoarthritis.   Bo Merino, MD  Note - This record has been created using Editor, commissioning.  Chart creation errors have been sought, but may not always  have been located. Such creation errors do not reflect on  the standard of medical care.

## 2019-12-09 ENCOUNTER — Ambulatory Visit: Payer: 59 | Admitting: Rheumatology

## 2019-12-09 ENCOUNTER — Other Ambulatory Visit: Payer: Self-pay

## 2019-12-09 ENCOUNTER — Encounter: Payer: Self-pay | Admitting: Family Medicine

## 2019-12-09 ENCOUNTER — Encounter: Payer: Self-pay | Admitting: Rheumatology

## 2019-12-09 VITALS — BP 145/98 | HR 78 | Resp 15 | Ht 65.0 in | Wt 214.4 lb

## 2019-12-09 DIAGNOSIS — K7581 Nonalcoholic steatohepatitis (NASH): Secondary | ICD-10-CM

## 2019-12-09 DIAGNOSIS — M5136 Other intervertebral disc degeneration, lumbar region: Secondary | ICD-10-CM | POA: Diagnosis not present

## 2019-12-09 DIAGNOSIS — I5189 Other ill-defined heart diseases: Secondary | ICD-10-CM | POA: Insufficient documentation

## 2019-12-09 DIAGNOSIS — N281 Cyst of kidney, acquired: Secondary | ICD-10-CM

## 2019-12-09 DIAGNOSIS — G4733 Obstructive sleep apnea (adult) (pediatric): Secondary | ICD-10-CM

## 2019-12-09 DIAGNOSIS — I10 Essential (primary) hypertension: Secondary | ICD-10-CM

## 2019-12-09 DIAGNOSIS — M170 Bilateral primary osteoarthritis of knee: Secondary | ICD-10-CM | POA: Diagnosis not present

## 2019-12-09 DIAGNOSIS — M797 Fibromyalgia: Secondary | ICD-10-CM

## 2019-12-09 DIAGNOSIS — Z8669 Personal history of other diseases of the nervous system and sense organs: Secondary | ICD-10-CM

## 2019-12-09 DIAGNOSIS — M5030 Other cervical disc degeneration, unspecified cervical region: Secondary | ICD-10-CM

## 2019-12-09 DIAGNOSIS — R768 Other specified abnormal immunological findings in serum: Secondary | ICD-10-CM

## 2019-12-09 DIAGNOSIS — Z8659 Personal history of other mental and behavioral disorders: Secondary | ICD-10-CM

## 2019-12-09 DIAGNOSIS — Z8639 Personal history of other endocrine, nutritional and metabolic disease: Secondary | ICD-10-CM

## 2019-12-09 DIAGNOSIS — Z8719 Personal history of other diseases of the digestive system: Secondary | ICD-10-CM

## 2019-12-09 NOTE — Patient Instructions (Signed)
Ankle Exercises Ask your health care provider which exercises are safe for you. Do exercises exactly as told by your health care provider and adjust them as directed. It is normal to feel mild stretching, pulling, tightness, or mild discomfort as you do these exercises. Stop right away if you feel sudden pain or your pain gets worse. Do not begin these exercises until told by your health care provider. Stretching and range-of-motion exercises These exercises warm up your muscles and joints and improve the movement and flexibility of your ankle. These exercises may also help to relieve pain. Dorsiflexion/plantar flexion  1. Sit with your __________ knee straight or bent. Do not rest your foot on anything. 2. Flex your __________ ankle to tilt the top of your foot toward your shin. This is called dorsiflexion. 3. Hold this position for __________ seconds. 4. Point your toes downward to tilt the top of your foot away from your shin. This is called plantar flexion. 5. Hold this position for __________ seconds. Repeat __________ times. Complete this exercise __________ times a day. Ankle alphabet  1. Sit with your __________ foot supported at your lower leg. ? Do not rest your foot on anything. ? Make sure your foot has room to move freely. 2. Think of your __________ foot as a paintbrush: ? Move your foot to trace each letter of the alphabet in the air. Keep your hip and knee still while you trace the letters. Trace every letter from A to Z. ? Make the letters as large as you can without causing or increasing any discomfort. Repeat __________ times. Complete this exercise __________ times a day. Passive ankle dorsiflexion This is an exercise in which something or someone moves your ankle for you. You do not move it yourself. 1. Sit on a chair that is placed on a non-carpeted surface. 2. Place your __________ foot on the floor, directly under your __________ knee. Extend your __________ leg for  support. 3. Keeping your heel down, slide your __________ foot back toward the chair until you feel a stretch at your ankle or calf. If you do not feel a stretch, slide your buttocks forward to the edge of the chair while keeping your heel down. 4. Hold this stretch for __________ seconds. Repeat __________ times. Complete this exercise __________ times a day. Strengthening exercises These exercises build strength and endurance in your ankle. Endurance is the ability to use your muscles for a long time, even after they get tired. Dorsiflexors These are muscles that lift your foot up. 1. Secure a rubber exercise band or tube to an object, such as a table leg, that will stay still when the band is pulled. Secure the other end around your __________ foot. 2. Sit on the floor, facing the object with your __________ leg extended. The band or tube should be slightly tense when your foot is relaxed. 3. Slowly flex your __________ ankle and toes to bring your foot toward your shin. 4. Hold this position for __________ seconds. 5. Slowly return your foot to the starting position, controlling the band as you do that. Repeat __________ times. Complete this exercise __________ times a day. Plantar flexors These are muscles that push your foot down. 1. Sit on the floor with your __________ leg extended. 2. Loop a rubber exercise band or tube around the ball of your __________ foot. The ball of your foot is on the walking surface, right under your toes. The band or tube should be slightly tense when your  foot is relaxed. 3. Slowly point your toes downward, pushing them away from you. 4. Hold this position for __________ seconds. 5. Slowly release the tension in the band or tube, controlling smoothly until your foot is back in the starting position. Repeat __________ times. Complete this exercise __________ times a day. Towel curls  1. Sit in a chair on a non-carpeted surface, and put your feet on the  floor. 2. Place a towel in front of your feet. If told by your health care provider, add a __________ pound weight to the end of the towel. 3. Keeping your heel on the floor, put your __________ foot on the towel. 4. Pull the towel toward you by grabbing the towel with your toes and curling them under. Keep your heel on the floor. 5. Let your toes relax. 6. Grab the towel again. Keep pulling the towel until it is completely underneath your foot. Repeat __________ times. Complete this exercise __________ times a day. Standing plantar flexion This is an exercise in which you use your toes to lift your body's weight while standing. 1. Stand with your feet shoulder-width apart. 2. Keep your weight spread evenly over the width of your feet while you rise up on your toes. Use a wall or table to steady yourself if needed, but try not to use it for support. 3. If this exercise is too easy, try these options: ? Shift your weight toward your __________ leg until you feel challenged. ? If told by your health care provider, lift your uninjured leg off the floor. 4. Hold this position for __________ seconds. Repeat __________ times. Complete this exercise __________ times a day. Tandem walking 1. Stand with one foot directly in front of the other. 2. Slowly raise your back foot up, lifting your heel before your toes, and place it directly in front of your other foot. 3. Continue to walk in this heel-to-toe way for __________ or for as long as told by your health care provider. Have a countertop or wall nearby to use if needed to keep your balance, but try not to hold onto anything for support. Repeat __________ times. Complete this exercise __________ times a day. This information is not intended to replace advice given to you by your health care provider. Make sure you discuss any questions you have with your health care provider. Document Revised: 04/07/2018 Document Reviewed: 04/09/2018 Else[employer] Patient  Education  2020 [employer], 15(4), [id number]. Retrieved April 30, 2018 from http://clinicalkey.com/nursing">  Knee Exercises Ask your health care provider which exercises are safe for you. Do exercises exactly as told by your health care provider and adjust them as directed. It is normal to feel mild stretching, pulling, tightness, or discomfort as you do these exercises. Stop right away if you feel sudden pain or your pain gets worse. Do not begin these exercises until told by your health care provider. Stretching and range-of-motion exercises These exercises warm up your muscles and joints and improve the movement and flexibility of your knee. These exercises also help to relieve pain and swelling. Knee extension, prone 6. Lie on your abdomen (prone position) on a bed. 7. Place your left / right knee just beyond the edge of the surface so your knee is not on the bed. You can put a towel under your left / right thigh just above your kneecap for comfort. 8. Relax your leg muscles and allow gravity to straighten your knee (extension). You should feel a stretch  behind your left / right knee. 9. Hold this position for __________ seconds. 10. Scoot up so your knee is supported between repetitions. Repeat __________ times. Complete this exercise __________ times a day. Knee flexion, active  3. Lie on your back with both legs straight. If this causes back discomfort, bend your left / right knee so your foot is flat on the floor. 4. Slowly slide your left / right heel back toward your buttocks. Stop when you feel a gentle stretch in the front of your knee or thigh (flexion). 5. Hold this position for __________ seconds. 6. Slowly slide your left / right heel back to the starting position. Repeat __________ times. Complete this exercise __________ times a day. Quadriceps stretch, prone  5. Lie on your abdomen on a firm surface, such as a bed or padded floor. 6. Bend  your left / right knee and hold your ankle. If you cannot reach your ankle or pant leg, loop a belt around your foot and grab the belt instead. 7. Gently pull your heel toward your buttocks. Your knee should not slide out to the side. You should feel a stretch in the front of your thigh and knee (quadriceps). 8. Hold this position for __________ seconds. Repeat __________ times. Complete this exercise __________ times a day. Hamstring, supine 6. Lie on your back (supine position). 7. Loop a belt or towel over the ball of your left / right foot. The ball of your foot is on the walking surface, right under your toes. 8. Straighten your left / right knee and slowly pull on the belt to raise your leg until you feel a gentle stretch behind your knee (hamstring). ? Do not let your knee bend while you do this. ? Keep your other leg flat on the floor. 9. Hold this position for __________ seconds. Repeat __________ times. Complete this exercise __________ times a day. Strengthening exercises These exercises build strength and endurance in your knee. Endurance is the ability to use your muscles for a long time, even after they get tired. Quadriceps, isometric This exercise stretches the muscles in front of your thigh (quadriceps) without moving your knee joint (isometric). 6. Lie on your back with your left / right leg extended and your other knee bent. Put a rolled towel or small pillow under your knee if told by your health care provider. 7. Slowly tense the muscles in the front of your left / right thigh. You should see your kneecap slide up toward your hip or see increased dimpling just above the knee. This motion will push the back of the knee toward the floor. 8. For __________ seconds, hold the muscle as tight as you can without increasing your pain. 9. Relax the muscles slowly and completely. Repeat __________ times. Complete this exercise __________ times a day. Straight leg raises This exercise  stretches the muscles in front of your thigh (quadriceps) and the muscles that move your hips (hip flexors). 7. Lie on your back with your left / right leg extended and your other knee bent. 8. Tense the muscles in the front of your left / right thigh. You should see your kneecap slide up or see increased dimpling just above the knee. Your thigh may even shake a bit. 9. Keep these muscles tight as you raise your leg 4-6 inches (10-15 cm) off the floor. Do not let your knee bend. 10. Hold this position for __________ seconds. 11. Keep these muscles tense as you lower your leg. 12.  Relax your muscles slowly and completely after each repetition. Repeat __________ times. Complete this exercise __________ times a day. Hamstring, isometric 5. Lie on your back on a firm surface. 6. Bend your left / right knee about __________ degrees. 7. Dig your left / right heel into the surface as if you are trying to pull it toward your buttocks. Tighten the muscles in the back of your thighs (hamstring) to "dig" as hard as you can without increasing any pain. 8. Hold this position for __________ seconds. 9. Release the tension gradually and allow your muscles to relax completely for __________ seconds after each repetition. Repeat __________ times. Complete this exercise __________ times a day. Hamstring curls If told by your health care provider, do this exercise while wearing ankle weights. Begin with __________ lb weights. Then increase the weight by 1 lb (0.5 kg) increments. Do not wear ankle weights that are more than __________ lb. 4. Lie on your abdomen with your legs straight. 5. Bend your left / right knee as far as you can without feeling pain. Keep your hips flat against the floor. 6. Hold this position for __________ seconds. 7. Slowly lower your leg to the starting position. Repeat __________ times. Complete this exercise __________ times a day. Squats This exercise strengthens the muscles in front  of your thigh and knee (quadriceps). 1. Stand in front of a table, with your feet and knees pointing straight ahead. You may rest your hands on the table for balance but not for support. 2. Slowly bend your knees and lower your hips like you are going to sit in a chair. ? Keep your weight over your heels, not over your toes. ? Keep your lower legs upright so they are parallel with the table legs. ? Do not let your hips go lower than your knees. ? Do not bend lower than told by your health care provider. ? If your knee pain increases, do not bend as low. 3. Hold the squat position for __________ seconds. 4. Slowly push with your legs to return to standing. Do not use your hands to pull yourself to standing. Repeat __________ times. Complete this exercise __________ times a day. Wall slides This exercise strengthens the muscles in front of your thigh and knee (quadriceps). 1. Lean your back against a smooth wall or door, and walk your feet out 18-24 inches (46-61 cm) from it. 2. Place your feet hip-width apart. 3. Slowly slide down the wall or door until your knees bend __________ degrees. Keep your knees over your heels, not over your toes. Keep your knees in line with your hips. 4. Hold this position for __________ seconds. Repeat __________ times. Complete this exercise __________ times a day. Straight leg raises This exercise strengthens the muscles that rotate the leg at the hip and move it away from your body (hip abductors). 1. Lie on your side with your left / right leg in the top position. Lie so your head, shoulder, knee, and hip line up. You may bend your bottom knee to help you keep your balance. 2. Roll your hips slightly forward so your hips are stacked directly over each other and your left / right knee is facing forward. 3. Leading with your heel, lift your top leg 4-6 inches (10-15 cm). You should feel the muscles in your outer hip lifting. ? Do not let your foot drift  forward. ? Do not let your knee roll toward the ceiling. 4. Hold this position for __________ seconds.  5. Slowly return your leg to the starting position. 6. Let your muscles relax completely after each repetition. Repeat __________ times. Complete this exercise __________ times a day. Straight leg raises This exercise stretches the muscles that move your hips away from the front of the pelvis (hip extensors). 1. Lie on your abdomen on a firm surface. You can put a pillow under your hips if that is more comfortable. 2. Tense the muscles in your buttocks and lift your left / right leg about 4-6 inches (10-15 cm). Keep your knee straight as you lift your leg. 3. Hold this position for __________ seconds. 4. Slowly lower your leg to the starting position. 5. Let your leg relax completely after each repetition. Repeat __________ times. Complete this exercise __________ times a day. This information is not intended to replace advice given to you by your health care provider. Make sure you discuss any questions you have with your health care provider. Document Revised: 05/01/2018 Document Reviewed: 05/01/2018 Elsevier Patient Education  2020 [employer].

## 2019-12-10 MED ORDER — FUROSEMIDE 20 MG PO TABS: ORAL_TABLET | ORAL | 2 refills | Status: DC

## 2019-12-10 NOTE — Telephone Encounter (Signed)
Tanya I doubt that the grade 1 diastolic dysfunction is causing significant weight gain for her or severe fluid retention but it can cause some fluid retention in the lower legs.  If the patient has pitting edema in the lower legs the diuretic can be switched to furosemide 20 mg 1 every morning in place of the HCTZ If the patient would like to do this can go with 30 tablets with 2 refills Patient would need to do a follow-up in person office visit to assess accurately any fluid retention thank you Recommend follow-up office visit within the next 2-3 weeks  Please connect with patient regarding the above thank you Dr. Nicki Reaper  If the patient chooses to stay with the HCTZ she should do a follow-up within the next 2 to 3 weeks to reassess

## 2019-12-10 NOTE — Addendum Note (Signed)
Addended by: Vicente Males on: 12/10/2019 11:12 AM   Modules accepted: Orders

## 2019-12-11 ENCOUNTER — Other Ambulatory Visit: Payer: Self-pay | Admitting: *Deleted

## 2019-12-11 MED ORDER — METFORMIN HCL ER 500 MG PO TB24: 500.0000 mg | ORAL_TABLET | Freq: Every day | ORAL | 1 refills | Status: DC

## 2019-12-11 NOTE — Telephone Encounter (Signed)
Nurses Starting off I recommend Metformin ER 500 mg 1 daily.,  #90, 1 refill, patient should try this first make sure she gets along with it before we increase dose Follow-up in the next several weeks as well

## 2019-12-13 ENCOUNTER — Encounter: Payer: Self-pay | Admitting: Internal Medicine

## 2019-12-23 ENCOUNTER — Other Ambulatory Visit: Payer: Self-pay | Admitting: Family Medicine

## 2020-01-01 ENCOUNTER — Encounter: Payer: Self-pay | Admitting: Family Medicine

## 2020-01-01 ENCOUNTER — Other Ambulatory Visit: Payer: Self-pay

## 2020-01-01 ENCOUNTER — Other Ambulatory Visit: Payer: Self-pay | Admitting: Family Medicine

## 2020-01-01 ENCOUNTER — Ambulatory Visit: Payer: 59 | Admitting: Family Medicine

## 2020-01-01 VITALS — BP 136/82 | Temp 98.0°F | Wt 206.6 lb

## 2020-01-01 DIAGNOSIS — K7581 Nonalcoholic steatohepatitis (NASH): Secondary | ICD-10-CM | POA: Diagnosis not present

## 2020-01-01 DIAGNOSIS — I5189 Other ill-defined heart diseases: Secondary | ICD-10-CM

## 2020-01-01 DIAGNOSIS — G4730 Sleep apnea, unspecified: Secondary | ICD-10-CM

## 2020-01-01 DIAGNOSIS — R7303 Prediabetes: Secondary | ICD-10-CM

## 2020-01-01 HISTORY — DX: Prediabetes: R73.03

## 2020-01-01 MED ORDER — CITALOPRAM HYDROBROMIDE 40 MG PO TABS: 40.0000 mg | ORAL_TABLET | Freq: Every day | ORAL | 1 refills | Status: DC

## 2020-01-01 MED ORDER — CLONAZEPAM 1 MG PO TABS: ORAL_TABLET | ORAL | 5 refills | Status: DC

## 2020-01-01 MED ORDER — LISINOPRIL 10 MG PO TABS: 20.0000 mg | ORAL_TABLET | Freq: Every day | ORAL | 1 refills | Status: DC

## 2020-01-01 NOTE — Progress Notes (Signed)
   Subjective:    Patient ID: [name], female    DOB: [birth year], 49 y.o.   MRN: [id number]  HPI Patient comes in today for follow up appointment.  Since last seen patient has not lost any weight, slight improvement with SOB and no improvement on sweating.  The patient did have an echo done it did show diastolic dysfunction we did discuss what this meant and encouraged her to do the best she can at keeping things under good control in many areas including weight, blood pressure, sugar We also discussed her elevated [name] the importance of Metformin and watching diet and trying to lose weight Patient reports eating pretty healthy and doing lots of walking and stretching with her job.   She recently sprained her left ankle.  Recently sprained her ankle after stepping off a bit a ottoman Wearing a brace had x-rays they were negative  Patient with ongoing fatigue tiredness feeling rundown Review of Systems  Constitutional: Negative for activity change, appetite change and fatigue.  HENT: Negative for congestion and rhinorrhea.   Respiratory: Negative for cough and shortness of breath.   Cardiovascular: Negative for chest pain and leg swelling.  Gastrointestinal: Negative for abdominal pain and diarrhea.  Endocrine: Negative for polydipsia and polyphagia.  Skin: Negative for color change.  Neurological: Negative for dizziness and weakness.  Psychiatric/Behavioral: Negative for behavioral problems and confusion.       Objective:   Physical Exam Vitals reviewed.  Constitutional:      General: She is not in acute distress. HENT:     Head: Normocephalic and atraumatic.  Eyes:     General:        Right eye: No discharge.        Left eye: No discharge.  Neck:     Trachea: No tracheal deviation.  Cardiovascular:     Rate and Rhythm: Normal rate and regular rhythm.     Heart sounds: Normal heart sounds. No murmur.  Pulmonary:     Effort: Pulmonary effort is normal. No  respiratory distress.     Breath sounds: Normal breath sounds.  Lymphadenopathy:     Cervical: No cervical adenopathy.  Skin:    General: Skin is warm and dry.  Neurological:     Mental Status: She is alert.     Coordination: Coordination normal.  Psychiatric:        Behavior: Behavior normal.    Ankle decent range of motion with some soreness and tenderness       Assessment & Plan:  1. Prediabetes We will repeat lab work later in the fall.  Very important for patient to do the best she can at regular physical activity and exercise and taking her Metformin  2. NASH (nonalcoholic steatohepatitis) Do the best she can with portion control exercise try to bring her weight down  3. Diastolic dysfunction Do the best can keeping blood pressure under control [name] under control and weight under control sugar under control and minimizing salt in diet  4. Sleep apnea, unspecified type Patient does have sleep apnea but she has not use her CPAP machine in 10 years I believe that this is contributing to her fatigue and tiredness recommend doing a up-to-date sleep study - Ambulatory referral to Sleep Studies  Intermittent sweats during the day does not have true night sweats.  More than likely that this is related to menopausal but could also be related to her Celexa if it gets worse she will notify us

## 2020-01-01 NOTE — Addendum Note (Signed)
Addended by: Carmelina Noun on: 01/01/2020 11:10 AM   Modules accepted: Orders

## 2020-01-01 NOTE — Patient Instructions (Signed)

## 2020-01-02 ENCOUNTER — Other Ambulatory Visit: Payer: Self-pay | Admitting: Family Medicine

## 2020-03-02 ENCOUNTER — Other Ambulatory Visit: Payer: 59 | Admitting: Obstetrics & Gynecology

## 2020-03-03 LAB — BASIC METABOLIC PANEL
BUN/Creatinine Ratio: 23 (ref 9–23)
BUN: 19 mg/dL (ref 6–24)
CO2: 26 mmol/L (ref 20–29)
Calcium: 9.1 mg/dL (ref 8.7–10.2)
Chloride: 97 mmol/L (ref 96–106)
Creatinine, Ser: 0.82 mg/dL (ref 0.57–1.00)
GFR calc Af Amer: 96 mL/min/{1.73_m2} (ref >59)
GFR calc non Af Amer: 84 mL/min/{1.73_m2} (ref >59)
Glucose: 138 mg/dL — ABNORMAL HIGH (ref 65–99)
Potassium: 4.6 mmol/L (ref 3.5–5.2)
Sodium: 136 mmol/L (ref 134–144)

## 2020-03-03 LAB — HEPATIC FUNCTION PANEL
ALT: 104 IU/L — ABNORMAL HIGH (ref 0–32)
AST: 141 IU/L — ABNORMAL HIGH (ref 0–40)
Albumin: 4.7 g/dL (ref 3.8–4.8)
Alkaline Phosphatase: 103 IU/L (ref 48–121)
Bilirubin Total: 0.6 mg/dL (ref 0.0–1.2)
Bilirubin, Direct: 0.15 mg/dL (ref 0.00–0.40)
Total Protein: 7.7 g/dL (ref 6.0–8.5)

## 2020-03-03 LAB — LIPID PANEL
Chol/HDL Ratio: 4.3 ratio (ref 0.0–4.4)
Cholesterol, Total: 193 mg/dL (ref 100–199)
HDL: 45 mg/dL (ref >39)
LDL Chol Calc (NIH): 95 mg/dL (ref 0–99)
Triglycerides: 315 mg/dL — ABNORMAL HIGH (ref 0–149)
VLDL Cholesterol Cal: 53 mg/dL — ABNORMAL HIGH (ref 5–40)

## 2020-03-03 LAB — HEMOGLOBIN A1C
Est. average glucose Bld gHb Est-mCnc: 123 mg/dL
Hgb A1c MFr Bld: 5.9 % — ABNORMAL HIGH (ref 4.8–5.6)

## 2020-03-04 ENCOUNTER — Encounter: Payer: Self-pay | Admitting: Family Medicine

## 2020-03-04 LAB — HM DIABETES EYE EXAM

## 2020-03-17 ENCOUNTER — Encounter: Payer: Self-pay | Admitting: Obstetrics & Gynecology

## 2020-03-17 ENCOUNTER — Ambulatory Visit (INDEPENDENT_AMBULATORY_CARE_PROVIDER_SITE_OTHER): Payer: 59 | Admitting: Obstetrics & Gynecology

## 2020-03-17 VITALS — BP 153/94 | HR 85 | Ht 65.0 in | Wt 209.0 lb

## 2020-03-17 DIAGNOSIS — Z01419 Encounter for gynecological examination (general) (routine) without abnormal findings: Secondary | ICD-10-CM | POA: Diagnosis not present

## 2020-03-17 MED ORDER — ESTRADIOL 2 MG PO TABS: 2.0000 mg | ORAL_TABLET | Freq: Every day | ORAL | 11 refills | Status: DC

## 2020-03-17 NOTE — Progress Notes (Signed)
Subjective:     [name] is a 49 y.o. female here for a routine exam.  No LMP recorded. Patient has had a hysterectomy. G2P2 Birth Control Method:  hysterectomy Menstrual Calendar(currently):   Current complaints: weight gain, vaginal dryness.   Current acute medical issues:  diabetes   Recent Gynecologic History No LMP recorded. Patient has had a hysterectomy. Last Pap: ,  n/a Last mammogram: 05/2017,  normal  Past Medical History:  Diagnosis Date  . Allergy   . Anemia    hx  . Anxiety    takes Klonopin daily as needed.takes Celexa daily  . Arthritis    left foot and hands  . Asthma    exercise induced  . Complication of anesthesia    slow to wake  up  . Diastolic dysfunction 31/5400   per patient   . High cholesterol    takes Pravastatin daily  . History of bronchitis    several yrs ago  . History of migraine    last one about 2 wks ago  . Hypertension   . IBS (irritable bowel syndrome)    takes Trulance and ProBiotic Daily  . Inflammation of hair follicles    takes Minocycline daily  . Insomnia    takes Melatonin nightly  . Liver fibrosis   . Migraine   . NASH (nonalcoholic steatohepatitis)   . Prediabetes 01/01/2020  . Sleep apnea    sleep study > 5 yrs ago. Uses CPAP  . Spastic bladder    takes Myrbetriq daily  . Tingling    r/t neck. Pseudoarthrosis   . UTI (urinary tract infection)    hx of but takes Macrodantin daily. has been on for 2 1/2 yrs     Past Surgical History:  Procedure Laterality Date  . ABDOMINAL HYSTERECTOMY  2005   complete hysterectomy both ovaries removed  . BREAST REDUCTION SURGERY  2009  . CARPAL TUNNEL RELEASE    . carpel tunnel Bilateral 02/2017, 2021  . CERVICAL DISC SURGERY  2007, 2009, 2014, 2017  . CESAREAN SECTION  94/03   x 2  . COLONOSCOPY    . ESOPHAGOGASTRODUODENOSCOPY    . POSTERIOR CERVICAL FUSION/FORAMINOTOMY N/A 01/07/2013   Procedure: POSTERIOR CERVICAL FUSION/FORAMINOTOMY LEVEL 2;  Surgeon: Hosie Spangle, MD;  Location: [facility];  Service: Neurosurgery;  Laterality: N/A;  Posterior Cervical Five-Six/Six-Seven Arthrodesis with Instrumentation  . POSTERIOR CERVICAL FUSION/FORAMINOTOMY N/A 07/04/2016   Procedure: CERVICAL THREE-FOUR POSTERIOR CERVICAL ARTHRODESIS;  Surgeon: Jovita Gamma, MD;  Location: [facility];  Service: Neurosurgery;  Laterality: N/A;  C3-C4 POSTERIOR CERVICAL ARTHRODESIS  . tummy tuck      OB History    Gravida  2   Para  2   Term      Preterm      AB      Living  2     SAB      TAB      Ectopic      Multiple      Live Births              Social History   Socioeconomic History  . Marital status: Married    Spouse name: Laverna Peace  . Number of children: 2  . Years of education: college  . Highest education level: Associate degree: academic program  Occupational History  . Occupation: self employed  Tobacco Use  . Smoking status: Never Smoker  . Smokeless tobacco: Never Used  Vaping Use  .  Vaping Use: Never used  Substance and Sexual Activity  . Alcohol use: Yes    Alcohol/week: 0.0 standard drinks    Comment: occasional  . Drug use: No  . Sexual activity: Yes    Birth control/protection: Surgical  Other Topics Concern  . Not on file  Social History Narrative   Lives with family   Caffeine- 2 daily   Social Determinants of Health   Financial Resource Strain: Low Risk   . Difficulty of Paying Living Expenses: Not very hard  Food Insecurity: No Food Insecurity  . Worried About Charity fundraiser in the Last Year: Never true  . Ran Out of Food in the Last Year: Never true  Transportation Needs: No Transportation Needs  . Lack of Transportation (Medical): No  . Lack of Transportation (Non-Medical): No  Physical Activity: Insufficiently Active  . Days of Exercise per Week: 1 day  . Minutes of Exercise per Session: 10 min  Stress: Stress Concern Present  . Feeling of Stress : Rather much  Social Connections: Socially  Integrated  . Frequency of Communication with Friends and Family: More than three times a week  . Frequency of Social Gatherings with Friends and Family: More than three times a week  . Attends Religious Services: More than 4 times per year  . Active Member of Clubs or Organizations: Yes  . Attends Archivist Meetings: More than 4 times per year  . Marital Status: Married    Family History  Problem Relation Age of Onset  . Cirrhosis Father        alcohol abuse  . Diabetes Father   . Hypertension Father   . Alcoholism Father   . Ovarian cancer Mother   . Hypertension Mother   . Irritable bowel syndrome Mother   . Neuropathy Mother   . Colon cancer Maternal Grandmother   . CAD Maternal Grandfather   . Drug abuse Brother   . Colon polyps Neg Hx   . Esophageal cancer Neg Hx   . Rectal cancer Neg Hx   . Stomach cancer Neg Hx      Current Outpatient Medications:  .  acyclovir (ZOVIRAX) 400 MG tablet, TAKE 1 TABLET BY MOUTH EVERY DAY, Disp: 90 tablet, Rfl: 5 .  albuterol (VENTOLIN HFA) 108 (90 Base) MCG/ACT inhaler, TAKE 2 PUFFS BY MOUTH EVERY 6 HOURS AS NEEDED FOR WHEEZE, Disp: 18 g, Rfl: 8 .  blood glucose meter kit and supplies, Dispense based on patient and insurance preference. Use to test glucose once a day. ICD -10 E11.9, Disp: 1 each, Rfl: 1 .  citalopram (CELEXA) 40 MG tablet, Take 1 tablet (40 mg total) by mouth daily., Disp: 90 tablet, Rfl: 1 .  clonazePAM (KLONOPIN) 1 MG tablet, Take one half to one tablet bid prn, Disp: 60 tablet, Rfl: 5 .  cyclobenzaprine (FLEXERIL) 10 MG tablet, Take 0.5-1 tablets (5-10 mg total) by mouth 3 (three) times daily as needed for muscle spasms., Disp: 50 tablet, Rfl: 0 .  estradiol (ESTRACE) 2 MG tablet, Take 1 tablet (2 mg total) by mouth daily., Disp: 30 tablet, Rfl: 11 .  fluticasone (FLONASE) 50 MCG/ACT nasal spray, USE 2 SPRAYS IN EACH NOSTRIL EVERY DAY (Patient taking differently: Place 2 sprays into both nostrils daily as  needed. ), Disp: 48 g, Rfl: 5 .  furosemide (LASIX) 20 MG tablet, TAKE ONE TABLET BY MOUTH EACH MORNING., Disp: 30 tablet, Rfl: 2 .  ibuprofen (ADVIL,MOTRIN) 800 MG tablet, Take 800 mg  by mouth every 8 (eight) hours as needed. for pain, Disp: , Rfl: 3 .  lisinopril (ZESTRIL) 10 MG tablet, Take 2 tablets (20 mg total) by mouth daily., Disp: 180 tablet, Rfl: 1 .  metFORMIN (GLUCOPHAGE XR) 500 MG 24 hr tablet, Take 1 tablet (500 mg total) by mouth daily with breakfast., Disp: 90 tablet, Rfl: 1 .  Multiple Vitamin (MULTIVITAMIN) tablet, Take 2 tablets by mouth daily., Disp: , Rfl:  .  MYRBETRIQ 50 MG TB24 tablet, TAKE 1 TABLET BY MOUTH EVERY DAY, Disp: 30 tablet, Rfl: 11 .  nitrofurantoin (MACRODANTIN) 100 MG capsule, Take 100 mg by mouth at bedtime. , Disp: , Rfl:  .  pravastatin (PRAVACHOL) 10 MG tablet, Take 10 mg by mouth daily., Disp: , Rfl:  .  Probiotic Product (PROBIOTIC DAILY PO), Take 2 tablets by mouth daily., Disp: , Rfl:  .  rosuvastatin (CRESTOR) 10 MG tablet, Take 1 tablet (10 mg total) by mouth daily. (Patient not taking: Reported on 12/09/2019), Disp: 90 tablet, Rfl: 1 .  valACYclovir (VALTREX) 1000 MG tablet, TAKE 2 TABLETS BY MOUTH NOW THEN 2 TABLETS BY MOUTH 12 HOURS LATER, Disp: 4 tablet, Rfl: 4  Review of Systems  Review of Systems  Constitutional: Negative for fever, chills, weight loss, malaise/fatigue and diaphoresis.  HENT: Negative for hearing loss, ear pain, nosebleeds, congestion, sore throat, neck pain, tinnitus and ear discharge.   Eyes: Negative for blurred vision, double vision, photophobia, pain, discharge and redness.  Respiratory: Negative for cough, hemoptysis, sputum production, shortness of breath, wheezing and stridor.   Cardiovascular: Negative for chest pain, palpitations, orthopnea, claudication, leg swelling and PND.  Gastrointestinal: negative for abdominal pain. Negative for heartburn, nausea, vomiting, diarrhea, constipation, blood in stool and melena.   Genitourinary: Negative for dysuria, urgency, frequency, hematuria and flank pain.  Musculoskeletal: Negative for myalgias, back pain, joint pain and falls.  Skin: Negative for itching and rash.  Neurological: Negative for dizziness, tingling, tremors, sensory change, speech change, focal weakness, seizures, loss of consciousness, weakness and headaches.  Endo/Heme/Allergies: Negative for environmental allergies and polydipsia. Does not bruise/bleed easily.  Psychiatric/Behavioral: Negative for depression, suicidal ideas, hallucinations, memory loss and substance abuse. The patient is not nervous/anxious and does not have insomnia.        Objective:  Blood pressure (!) 153/94, pulse 85, height 5' 5"  (1.651 m), weight 209 lb (94.8 kg).   Physical Exam  Vitals reviewed. Constitutional: She is oriented to person, place, and time. She appears well-developed and well-nourished.  HENT:  Head: Normocephalic and atraumatic.        Right Ear: External ear normal.  Left Ear: External ear normal.  Nose: Nose normal.  Mouth/Throat: Oropharynx is clear and moist.  Eyes: Conjunctivae and EOM are normal. Pupils are equal, round, and reactive to light. Right eye exhibits no discharge. Left eye exhibits no discharge. No scleral icterus.  Neck: Normal range of motion. Neck supple. No tracheal deviation present. No thyromegaly present.  Cardiovascular: Normal rate, regular rhythm, normal heart sounds and intact distal pulses.  Exam reveals no gallop and no friction rub.   No murmur heard. Respiratory: Effort normal and breath sounds normal. No respiratory distress. She has no wheezes. She has no rales. She exhibits no tenderness.  GI: Soft. Bowel sounds are normal. She exhibits no distension and no mass. There is no tenderness. There is no rebound and no guarding.  Genitourinary:  Breasts no masses skin changes or nipple changes bilaterally  Vulva is normal without lesions Vagina is pink moist  without discharge Cervix normal in appearance and pap is done Uterus is normal size shape and contour Adnexa is negative with normal sized ovaries   Musculoskeletal: Normal range of motion. She exhibits no edema and no tenderness.  Neurological: She is alert and oriented to person, place, and time. She has normal reflexes. She displays normal reflexes. No cranial nerve deficit. She exhibits normal muscle tone. Coordination normal.  Skin: Skin is warm and dry. No rash noted. No erythema. No pallor.  Psychiatric: She has a normal mood and affect. Her behavior is normal. Judgment and thought content normal.       Medications Ordered at today's visit: Meds ordered this encounter  Medications  . estradiol (ESTRACE) 2 MG tablet    Sig: Take 1 tablet (2 mg total) by mouth daily.    Dispense:  30 tablet    Refill:  11    Other orders placed at today's visit: No orders of the defined types were placed in this encounter.     Assessment:    Normal Gyn exam.   vaginal dryness, increase oral estrogen to 2 mg, add vaginal estrogen Plan:    Hormone replacement therapy: hormone replacement therapy: increase estradiol to 2 mg daily. Follow up in: 3 years.     Return in about 3 years (around 03/18/2023) for Follow up, with Dr Elonda Husky.

## 2020-03-20 ENCOUNTER — Other Ambulatory Visit: Payer: Self-pay | Admitting: Family Medicine

## 2020-03-22 ENCOUNTER — Other Ambulatory Visit: Payer: Self-pay | Admitting: Family Medicine

## 2020-03-25 ENCOUNTER — Encounter: Payer: Self-pay | Admitting: Internal Medicine

## 2020-03-25 ENCOUNTER — Ambulatory Visit (INDEPENDENT_AMBULATORY_CARE_PROVIDER_SITE_OTHER): Payer: 59 | Admitting: Internal Medicine

## 2020-03-25 VITALS — BP 132/78 | HR 90 | Ht 65.0 in | Wt 207.0 lb

## 2020-03-25 DIAGNOSIS — E669 Obesity, unspecified: Secondary | ICD-10-CM | POA: Diagnosis not present

## 2020-03-25 DIAGNOSIS — R109 Unspecified abdominal pain: Secondary | ICD-10-CM

## 2020-03-25 DIAGNOSIS — K582 Mixed irritable bowel syndrome: Secondary | ICD-10-CM

## 2020-03-25 DIAGNOSIS — K7581 Nonalcoholic steatohepatitis (NASH): Secondary | ICD-10-CM

## 2020-03-25 DIAGNOSIS — R10A1 Flank pain, right side: Secondary | ICD-10-CM

## 2020-03-25 NOTE — Patient Instructions (Addendum)
You have been referred to [facility]. You should hear from their office within the next 1-2 weeks with an appointment. If you do not, please give [place] a call.  Please follow up with Dr Hilarie Fredrickson in 6 months.  Call our office should your flank pain not improve in 1 month.  Make certain to follow risk factor modification for your NASH- weight reduction, diet, exercise.  If you are age 51 or older, your body mass index should be between 23-30. Your Body mass index is 34.45 kg/m. If this is out of the aforementioned range listed, please consider follow up with your Primary Care Provider.  If you are age 62 or younger, your body mass index should be between 19-25. Your Body mass index is 34.45 kg/m. If this is out of the aformentioned range listed, please consider follow up with your Primary Care Provider.   Due to recent changes in healthcare laws, you may see the results of your imaging and laboratory studies on MyChart before your provider has had a chance to review them.  We understand that in some cases there may be results that are confusing or concerning to you. Not all laboratory results come back in the same time frame and the provider may be waiting for multiple results in order to interpret others.  Please give [place] 48 hours in order for your provider to thoroughly review all the results before contacting the office for clarification of your results.

## 2020-03-25 NOTE — Progress Notes (Signed)
Subjective:    Patient ID: [name], female    DOB: [birth year], 49 y.o.   MRN: [id number]  HPI [name] is a 49 year old female with a history of NASH (grade 2 fibrosis on biopsy February 2018), IBS with history of constipation, obesity, diabetes, hyperlipidemia, sleep apnea who is seen for follow-up.  She is here alone today.  She was last seen on 05/14/2019  She reports that she has been under an inordinate amount of stress lately.  Her ex-husband died on 02-Apr-2020 after passing out at work.  This was attributed to a heart condition and at some point prior to death he developed DKA.  Her 70 year old daughter was involved in his medical care towards the end of his life and had to make the very difficult decision to discontinue life support.  She has been having right mid back pain which is worse when she lays on her right side.  She can feel the pain now but it is in between her right iliac crest and her right lower rib and the flank area.  Does radiate occasionally towards the right lower abdomen.  Bowel movements have been back and forth between diarrhea or loose stools and constipation.  No blood in her stool or melena.  Stools can be urgent.  She has recently developed UTI and was having hematuria.  She has been started on Keflex and Pyridium.  She has been on suppressive Macrodantin for 7 years.  She has been unsuccessful with weight loss of late though she was seen by [facility] [place] to consider clinical trial for NASH.  She has not enrolled in a study of yet.  Primary care repeated an ultrasound in April 21 which showed steatosis but otherwise negative.   Review of Systems As per HPI, otherwise negative  Current Medications, Allergies, Past Medical History, Past Surgical History, Family History and Social History were reviewed in [employer] record.     Objective:   Physical Exam BP 132/78   Pulse 90    Ht 5' 5"  (1.651 m)   Wt 207 lb (93.9 kg)   BMI 34.45 kg/m  Gen: awake, alert, NAD HEENT: anicteric, op clear CV: RRR, no mrg Pulm: CTA b/l Abd: soft, obese, NT/ND, +BS throughout Ext: no c/c/e Neuro: nonfocal  ABDOMEN ULTRASOUND COMPLETE   COMPARISON:  08/08/2016   FINDINGS: Gallbladder: No gallstones or wall thickening visualized. No sonographic Murphy sign noted by sonographer.   Common bile duct: Diameter: 4 mm, within normal limits.   Liver: Diffusely increased echogenicity of the hepatic parenchyma, consistent with hepatic steatosis. No hepatic mass identified. Portal vein is patent on color Doppler imaging with normal direction of blood flow towards the liver.   IVC: No abnormality visualized.   Pancreas: Visualized portion unremarkable.   Spleen: Size and appearance within normal limits.   Right Kidney: Length: 10.0 cm. Echogenicity within normal limits. No mass or hydronephrosis visualized.   Left Kidney: Length: 11.9 cm. Echogenicity within normal limits. Small cysts are again seen in the mid and lower pole with mild complexity, but no significant change compared to prior exam. No mass or hydronephrosis visualized.   Abdominal aorta: No aneurysm visualized.   Other findings: None.   IMPRESSION: No acute findings.   Diffuse hepatic steatosis.     Electronically Signed   By: Marlaine Hind M.D.   On: 11/22/2019 15:38    CMP     Component Value Date/Time  NA 136 03/02/2020 0846   K 4.6 03/02/2020 0846   CL 97 03/02/2020 0846   CO2 26 03/02/2020 0846   GLUCOSE 138 (H) 03/02/2020 0846   GLUCOSE 104 (H) 08/31/2016 1109   BUN 19 03/02/2020 0846   CREATININE 0.82 03/02/2020 0846   CREATININE 0.82 01/15/2015 2351   CALCIUM 9.1 03/02/2020 0846   PROT 7.7 03/02/2020 0846   ALBUMIN 4.7 03/02/2020 0846   AST 141 (H) 03/02/2020 0846   ALT 104 (H) 03/02/2020 0846   ALKPHOS 103 03/02/2020 0846   BILITOT 0.6 03/02/2020 0846   GFRNONAA 84 03/02/2020  0846   GFRAA 96 03/02/2020 0846   CBC    Component Value Date/Time   WBC 9.5 11/20/2019 1149   WBC 8.1 08/31/2016 1109   RBC 4.32 11/20/2019 1149   RBC 4.41 08/31/2016 1109   HGB 14.3 11/20/2019 1149   HCT 42.2 11/20/2019 1149   PLT 303 11/20/2019 1149   MCV 98 (H) 11/20/2019 1149   MCH 33.1 (H) 11/20/2019 1149   MCH 29.9 08/31/2016 1109   MCHC 33.9 11/20/2019 1149   MCHC 33.0 08/31/2016 1109   RDW 12.4 11/20/2019 1149   LYMPHSABS 3.5 (H) 11/20/2019 1149   MONOABS 0.8 08/31/2016 1109   EOSABS 0.1 11/20/2019 1149   BASOSABS 0.0 11/20/2019 1149   Lab Results  Component Value Date   INR 1.0 08/25/2016   INR 1.1 (H) 03/15/2016   INR 0.91 06/09/2010         Assessment & Plan:  49 year old female with a history of NASH (grade 2 fibrosis on biopsy February 2018), IBS with history of constipation, obesity, diabetes, hyperlipidemia, sleep apnea who is seen for follow-up.   1.  NASH (biopsy proven February 2018, stage 2 fibrosis) --her liver enzymes have been persistently elevated and were so when last checked.  I have stressed the importance of risk factor modification including high control of blood pressure, lipid levels and blood glucose.  Ideally she would lose weight to help improve overall hepatic steatosis but this is been a challenge for her.  I am going to recommend that she see [facility] to work on strategies for overall weight reduction.  As far as clinical trials for [name], I am not familiar with the practice in [place], but would be willing to review information prior to her enrolling in clinical trial.  Also suggested that I could refer her to [facility] hepatology as they have a very active NASH/NAFLD clinic and also access to clinical trials. --Continue diet/exercise and risk factor modification --Vit E 800 IU daily is less effective in the setting of diabetes but likely will not cause any harm --Referral to [facility] --Consideration of  clinical trial enrollment for NASH versus referral to [facility]  2.  IBS with alternating bowel habits --stable.  Metamucil or Citrucel will likely help even out bowel movements  3.  Right flank pain --I favor musculoskeletal however if this continues I would recommend cross-sectional imaging of the abdomen pelvis with CT scan with contrast.  She will monitor this over the coming weeks and let me know if it fails to improve or worsens  4.  CRC screening --normal colonoscopy April 2017, family history of colon cancer thus repeat colonoscopy recommended April 2022.  Will discuss at next visit  Follow-up in 6 months  30 minutes total spent today including patient facing time, coordination of care, reviewing medical history/procedures/pertinent radiology studies, and documentation of the encounter.

## 2020-04-11 ENCOUNTER — Other Ambulatory Visit: Payer: Self-pay | Admitting: Obstetrics & Gynecology

## 2020-04-17 ENCOUNTER — Encounter: Payer: Self-pay | Admitting: Family Medicine

## 2020-04-17 DIAGNOSIS — G4730 Sleep apnea, unspecified: Secondary | ICD-10-CM

## 2020-04-20 ENCOUNTER — Encounter (INDEPENDENT_AMBULATORY_CARE_PROVIDER_SITE_OTHER): Payer: Self-pay | Admitting: Family Medicine

## 2020-04-20 ENCOUNTER — Other Ambulatory Visit: Payer: Self-pay

## 2020-04-20 ENCOUNTER — Ambulatory Visit (INDEPENDENT_AMBULATORY_CARE_PROVIDER_SITE_OTHER): Payer: 59 | Admitting: Family Medicine

## 2020-04-20 VITALS — BP 150/90 | HR 86 | Temp 98.0°F | Ht 65.0 in | Wt 205.0 lb

## 2020-04-20 DIAGNOSIS — K7581 Nonalcoholic steatohepatitis (NASH): Secondary | ICD-10-CM

## 2020-04-20 DIAGNOSIS — I10 Essential (primary) hypertension: Secondary | ICD-10-CM

## 2020-04-20 DIAGNOSIS — E7849 Other hyperlipidemia: Secondary | ICD-10-CM

## 2020-04-20 DIAGNOSIS — R5383 Other fatigue: Secondary | ICD-10-CM

## 2020-04-20 DIAGNOSIS — Z1331 Encounter for screening for depression: Secondary | ICD-10-CM

## 2020-04-20 DIAGNOSIS — R7303 Prediabetes: Secondary | ICD-10-CM | POA: Diagnosis not present

## 2020-04-20 DIAGNOSIS — N3289 Other specified disorders of bladder: Secondary | ICD-10-CM | POA: Insufficient documentation

## 2020-04-20 DIAGNOSIS — G4733 Obstructive sleep apnea (adult) (pediatric): Secondary | ICD-10-CM

## 2020-04-20 DIAGNOSIS — E669 Obesity, unspecified: Secondary | ICD-10-CM

## 2020-04-20 DIAGNOSIS — Z0289 Encounter for other administrative examinations: Secondary | ICD-10-CM

## 2020-04-20 DIAGNOSIS — Z6834 Body mass index (BMI) 34.0-34.9, adult: Secondary | ICD-10-CM

## 2020-04-20 DIAGNOSIS — R197 Diarrhea, unspecified: Secondary | ICD-10-CM | POA: Diagnosis not present

## 2020-04-20 DIAGNOSIS — F419 Anxiety disorder, unspecified: Secondary | ICD-10-CM

## 2020-04-20 DIAGNOSIS — R0602 Shortness of breath: Secondary | ICD-10-CM | POA: Diagnosis not present

## 2020-04-20 DIAGNOSIS — Z9189 Other specified personal risk factors, not elsewhere classified: Secondary | ICD-10-CM | POA: Diagnosis not present

## 2020-04-20 NOTE — Telephone Encounter (Signed)
Please put in referral for sleep study and work with patient regarding this thank you

## 2020-04-20 NOTE — Addendum Note (Signed)
Addended by: Vicente Males on: 04/20/2020 03:47 PM   Modules accepted: Orders

## 2020-04-21 LAB — COMPREHENSIVE METABOLIC PANEL
ALT: 91 IU/L — ABNORMAL HIGH (ref 0–32)
AST: 71 IU/L — ABNORMAL HIGH (ref 0–40)
Albumin/Globulin Ratio: 1.6 (ref 1.2–2.2)
Albumin: 4.6 g/dL (ref 3.8–4.8)
Alkaline Phosphatase: 103 IU/L (ref 44–121)
BUN/Creatinine Ratio: 13 (ref 9–23)
BUN: 12 mg/dL (ref 6–24)
Bilirubin Total: 0.5 mg/dL (ref 0.0–1.2)
CO2: 25 mmol/L (ref 20–29)
Calcium: 9.7 mg/dL (ref 8.7–10.2)
Chloride: 95 mmol/L — ABNORMAL LOW (ref 96–106)
Creatinine, Ser: 0.89 mg/dL (ref 0.57–1.00)
GFR calc Af Amer: 87 mL/min/{1.73_m2} (ref >59)
GFR calc non Af Amer: 76 mL/min/{1.73_m2} (ref >59)
Globulin, Total: 2.9 g/dL (ref 1.5–4.5)
Glucose: 130 mg/dL — ABNORMAL HIGH (ref 65–99)
Potassium: 4.1 mmol/L (ref 3.5–5.2)
Sodium: 135 mmol/L (ref 134–144)
Total Protein: 7.5 g/dL (ref 6.0–8.5)

## 2020-04-21 LAB — CBC WITH DIFFERENTIAL/PLATELET
Basophils Absolute: 0 10*3/uL (ref 0.0–0.2)
Basos: 0 %
EOS (ABSOLUTE): 0.2 10*3/uL (ref 0.0–0.4)
Eos: 2 %
Hematocrit: 40.1 % (ref 34.0–46.6)
Hemoglobin: 13.4 g/dL (ref 11.1–15.9)
Immature Grans (Abs): 0 10*3/uL (ref 0.0–0.1)
Immature Granulocytes: 0 %
Lymphocytes Absolute: 2.9 10*3/uL (ref 0.7–3.1)
Lymphs: 30 %
MCH: 32.6 pg (ref 26.6–33.0)
MCHC: 33.4 g/dL (ref 31.5–35.7)
MCV: 98 fL — ABNORMAL HIGH (ref 79–97)
Monocytes Absolute: 0.8 10*3/uL (ref 0.1–0.9)
Monocytes: 8 %
Neutrophils Absolute: 5.8 10*3/uL (ref 1.4–7.0)
Neutrophils: 60 %
Platelets: 233 10*3/uL (ref 150–450)
RBC: 4.11 x10E6/uL (ref 3.77–5.28)
RDW: 12.8 % (ref 11.7–15.4)
WBC: 9.7 10*3/uL (ref 3.4–10.8)

## 2020-04-21 LAB — TSH: TSH: 1.53 u[IU]/mL (ref 0.450–4.500)

## 2020-04-21 LAB — VITAMIN D 25 HYDROXY (VIT D DEFICIENCY, FRACTURES): Vit D, 25-Hydroxy: 48.2 ng/mL (ref 30.0–100.0)

## 2020-04-21 LAB — FOLATE: Folate: 3.5 ng/mL (ref >3.0)

## 2020-04-21 LAB — INSULIN, RANDOM: INSULIN: 29 u[IU]/mL — ABNORMAL HIGH (ref 2.6–24.9)

## 2020-04-21 LAB — VITAMIN B12: Vitamin B-12: 509 pg/mL (ref 232–1245)

## 2020-04-21 LAB — T4: T4, Total: 7.5 ug/dL (ref 4.5–12.0)

## 2020-04-21 LAB — T3: T3, Total: 160 ng/dL (ref 71–180)

## 2020-04-22 NOTE — Progress Notes (Signed)
Dear [name]. Pyrtle, MD,   Thank you for referring [name] to our clinic. The following note includes my evaluation and treatment recommendations.  Chief Complaint:   OBESITY [name] (MR# [id number]) is a 49 y.o. female who presents for evaluation and treatment of obesity and related comorbidities. Current BMI is Body mass index is 34.11 kg/m. [name] has been struggling with her weight for many years and has been unsuccessful in either losing weight, maintaining weight loss, or reaching her healthy weight goal.  [name] is currently in the action stage of change and ready to dedicate time achieving and maintaining a healthier weight. [name] is interested in becoming our patient and working on intensive lifestyle modifications including (but not limited to) diet and exercise for weight loss.  [name]'s habits were reviewed today and are as follows: Her family eats meals together, she thinks her family will eat healthier with her, her desired weight loss is 55 lbs, she started gaining weight after her first child, her heaviest weight ever was 208 pounds, she snacks frequently in the evenings, she skips meals frequently, she is frequently drinking liquids with calories, she frequently eats larger portions than normal and she struggles with emotional eating.  Depression Screen [name]'s Food and Mood (modified PHQ-9) score was 25.  Depression screen [name] 2/9 04/20/2020  Decreased Interest 3  Down, Depressed, Hopeless 3  PHQ - 2 Score 6  Altered sleeping 3  Tired, decreased energy 3  Change in appetite 2  Feeling bad or failure about yourself  3  Trouble concentrating 3  Moving slowly or fidgety/restless 3  Suicidal thoughts 2  PHQ-9 Score 25  Difficult doing work/chores Very difficult   Subjective:   1. Other fatigue [name] admits to daytime somnolence and admits to waking up still tired. Patent has a history of symptoms of daytime fatigue. [name] generally  gets 6 or 7 hours of sleep per night, and states that she has nightime awakenings. Snoring is present. Apneic episodes are present. Epworth Sleepiness Score is 11. EKG-normal sinus rhythm at 91 BPM.  2. SOB (shortness of breath) on exertion [name] notes increasing shortness of breath with exercising and seems to be worsening over time with weight gain. She notes getting out of breath sooner with activity than she used to. This has not gotten worse recently. [name] denies shortness of breath at rest or orthopnea.  3. Diarrhea, unspecified type [name] has had diarrhea 3-4 weeks after antibiotic use for bladder infection.  4. Pre-diabetes [name] recent [name] was 5.8 (previously 6.4). she is on metformin daily (500 mg).  5. Hypertension, unspecified type [name] blood pressure is slightly elevated today. She is on lisinopril and furosemide. She denies chest pain, chest pressure, or headache.   6. Other hyperlipidemia [name] is on statin. Last LDL was 95 on recenet labs.  7. NASH (nonalcoholic steatohepatitis) [name] sees Dr. Hilarie Li. Last liver enzyme test showed ALT and AST >100.  8. OSA (obstructive sleep apnea) [name] is supposed to wear CPAP, she doesn't  wear secondary to claustrophobia. Repeat sleep study pending.  9. Anxiety [name] is on Citalopram and Clonazepam. She is having a lot of stress from family. She is doing emotional eating as a coping mechanism.  10. At risk for diabetes mellitus [name] is at higher than average risk for developing diabetes due to her obesity.   Assessment/Plan:   1. Other fatigue [name] does feel that her weight is causing her energy to be lower than  it should be. Fatigue may be related to obesity, depression or many other causes. Labs will be ordered, and in the meanwhile, [name] will focus on self care including making healthy food choices, increasing physical activity and focusing on stress reduction.  - EKG 12-Lead - Vitamin B12 -  Folate - T3 - T4 - TSH - VITAMIN D 25 Hydroxy (Vit-D Deficiency, Fractures)  2. SOB (shortness of breath) on exertion [name] does feel that she gets out of breath more easily that she used to when she exercises. [name]'s shortness of breath appears to be obesity related and exercise induced. She has agreed to work on weight loss and gradually increase exercise to treat her exercise induced shortness of breath. Will continue to monitor closely.  - CBC with Differential/Platelet  3. Diarrhea, unspecified type [name] will follow up with primary care for testing.  4. Pre-diabetes [name] will continue to work on weight loss, exercise, and decreasing simple carbohydrates to help decrease the risk of diabetes. We will check labs today.  - Insulin, random  5. Hypertension, unspecified type [name] is working on healthy weight loss and exercise to improve blood pressure control. We will watch for signs of hypotension as she continues her lifestyle modifications. EKG was done, and we will check labs today.  6. Other hyperlipidemia Cardiovascular risk and specific lipid/LDL goals reviewed. We discussed several lifestyle modifications today and [name] will continue to work on diet, exercise and weight loss efforts. We will repeat labs in 3 months. Orders and follow up as documented in patient record.   Counseling Intensive lifestyle modifications are the first line treatment for this issue. . Dietary changes: Increase soluble fiber. Decrease simple carbohydrates. . Exercise changes: Moderate to vigorous-intensity aerobic activity 150 minutes per week if tolerated. . Lipid-lowering medications: see documented in medical record.  7. NASH (nonalcoholic steatohepatitis) We will check labs today. [name] will follow up as directed.  - Comprehensive metabolic panel  8. OSA (obstructive sleep apnea) Intensive lifestyle modifications are the first line treatment for this issue. We discussed  several lifestyle modifications today. We will follow up at her next appointment. [name] will continue to work on diet, exercise and weight loss efforts. We will continue to monitor. Orders and follow up as documented in patient record.   9. Anxiety Behavior modification techniques were discussed today to help Geraldin deal with her anxiety. We will refer to Dr. Mallie Mussel, our Bariatric Psychologist for evaluation. She is to seek outisde counseling for familial stress. Orders and follow up as documented in patient record.   10. Depression screening Nikkita had a positive depression screening. Depression is commonly associated with obesity and often results in emotional eating behaviors. We will monitor this closely and work on CBT to help improve the non-hunger eating patterns. Referral to Psychology may be required if no improvement is seen as she continues in our clinic.  11. At risk for diabetes mellitus Brytni was given approximately 15 minutes of diabetes education and counseling today. We discussed intensive lifestyle modifications today with an emphasis on weight loss as well as increasing exercise and decreasing simple carbohydrates in her diet. We also reviewed medication options with an emphasis on risk versus benefit of those discussed.   Repetitive spaced learning was employed today to elicit superior memory formation and behavioral change.  12. Class 1 obesity with serious comorbidity and body mass index (BMI) of 34.0 to 34.9 in adult, unspecified obesity type Parminder is currently in the action stage of change and her  goal is to continue with weight loss efforts. I recommend Lotoya begin the structured treatment plan as follows:  She has agreed to the Category 3 Plan.  Exercise goals: All adults should avoid inactivity. Some physical activity is better than none, and adults who participate in any amount of physical activity gain some health benefits.   Behavioral modification  strategies: increasing lean protein intake, meal planning and cooking strategies, keeping healthy foods in the home and planning for success.  She was informed of the importance of frequent follow-up visits to maximize her success with intensive lifestyle modifications for her multiple health conditions. She was informed we would discuss her lab results at her next visit unless there is a critical issue that needs to be addressed sooner. Jake agreed to keep her next visit at the agreed upon time to discuss these results.  Objective:   Blood pressure (!) 150/90, pulse 86, temperature 98 F (36.7 C), temperature source Oral, height 5' 5"  (1.651 m), weight 205 lb (93 kg), SpO2 97 %. Body mass index is 34.11 kg/m.  EKG: Normal sinus rhythm, rate 91 BPM.  Indirect Calorimeter completed today shows a VO2 of 341 and a REE of 2370.  Her calculated basal metabolic rate is 9935 thus her basal metabolic rate is better than expected.  General: Cooperative, alert, well developed, in no acute distress. HEENT: Conjunctivae and lids unremarkable. Cardiovascular: Regular rhythm.  Lungs: Normal work of breathing. Neurologic: No focal deficits.   Lab Results  Component Value Date   CREATININE 0.89 04/20/2020   BUN 12 04/20/2020   NA 135 04/20/2020   K 4.1 04/20/2020   CL 95 (L) 04/20/2020   CO2 25 04/20/2020   Lab Results  Component Value Date   ALT 91 (H) 04/20/2020   AST 71 (H) 04/20/2020   ALKPHOS 103 04/20/2020   BILITOT 0.5 04/20/2020   Lab Results  Component Value Date   HGBA1C 5.9 (H) 03/02/2020   HGBA1C 6.4 (H) 11/20/2019   HGBA1C 5.8 (H) 08/14/2019   HGBA1C 6.1 (H) 06/01/2015   HGBA1C 6.0 (H) 01/15/2015   Lab Results  Component Value Date   INSULIN 29.0 (H) 04/20/2020   Lab Results  Component Value Date   TSH 1.530 04/20/2020   Lab Results  Component Value Date   CHOL 193 03/02/2020   HDL 45 03/02/2020   LDLCALC 95 03/02/2020   TRIG 315 (H) 03/02/2020   CHOLHDL 4.3  03/02/2020   Lab Results  Component Value Date   WBC 9.7 04/20/2020   HGB 13.4 04/20/2020   HCT 40.1 04/20/2020   MCV 98 (H) 04/20/2020   PLT 233 04/20/2020   No results found for: IRON, TIBC, FERRITIN  Attestation Statements:   Reviewed by clinician on day of visit: allergies, medications, problem list, medical history, surgical history, family history, social history, and previous encounter notes.   I, Trixie Dredge, am acting as transcriptionist for Coralie Common, MD.  This is the patient's first visit at [facility]. The patient's NEW PATIENT PACKET was reviewed at length. Included in the packet: current and past health history, medications, allergies, ROS, gynecologic history (women only), surgical history, family history, social history, weight history, weight loss surgery history (for those that have had weight loss surgery), nutritional evaluation, mood and food questionnaire, PHQ9, Epworth questionnaire, sleep habits questionnaire, patient life and health improvement goals questionnaire. These will all be scanned into the patient's chart under media.   During the visit, I independently reviewed the  patient's EKG, bioimpedance scale results, and indirect calorimeter results. I used this information to tailor a meal plan for the patient that will help her to lose weight and will improve her obesity-related conditions going forward. I performed a medically necessary appropriate examination and/or evaluation. I discussed the assessment and treatment plan with the patient. The patient was provided an opportunity to ask questions and all were answered. The patient agreed with the plan and demonstrated an understanding of the instructions. Labs were ordered at this visit and will be reviewed at the next visit unless more critical results need to be addressed immediately. Clinical information was updated and documented in the EMR.   Time spent on visit including pre-visit  chart review and post-visit care was 45 minutes.   A separate 15 minutes was spent on risk counseling (see above).    I have reviewed the above documentation for accuracy and completeness, and I agree with the above. - Jinny Blossom, MD

## 2020-04-29 ENCOUNTER — Ambulatory Visit: Payer: 59 | Admitting: Family Medicine

## 2020-04-29 ENCOUNTER — Other Ambulatory Visit: Payer: Self-pay

## 2020-04-29 ENCOUNTER — Encounter: Payer: Self-pay | Admitting: Family Medicine

## 2020-04-29 VITALS — BP 122/72 | Temp 97.6°F | Ht 66.0 in | Wt 206.0 lb

## 2020-04-29 DIAGNOSIS — R7989 Other specified abnormal findings of blood chemistry: Secondary | ICD-10-CM

## 2020-04-29 DIAGNOSIS — R229 Localized swelling, mass and lump, unspecified: Secondary | ICD-10-CM | POA: Diagnosis not present

## 2020-04-29 DIAGNOSIS — R252 Cramp and spasm: Secondary | ICD-10-CM | POA: Diagnosis not present

## 2020-04-29 DIAGNOSIS — G4730 Sleep apnea, unspecified: Secondary | ICD-10-CM | POA: Diagnosis not present

## 2020-04-29 DIAGNOSIS — Z23 Encounter for immunization: Secondary | ICD-10-CM | POA: Diagnosis not present

## 2020-04-29 DIAGNOSIS — R7303 Prediabetes: Secondary | ICD-10-CM

## 2020-04-29 DIAGNOSIS — F439 Reaction to severe stress, unspecified: Secondary | ICD-10-CM

## 2020-04-29 DIAGNOSIS — K7581 Nonalcoholic steatohepatitis (NASH): Secondary | ICD-10-CM

## 2020-04-29 DIAGNOSIS — R197 Diarrhea, unspecified: Secondary | ICD-10-CM | POA: Diagnosis not present

## 2020-04-29 DIAGNOSIS — E785 Hyperlipidemia, unspecified: Secondary | ICD-10-CM

## 2020-04-29 MED ORDER — CITALOPRAM HYDROBROMIDE 40 MG PO TABS
40.0000 mg | ORAL_TABLET | Freq: Every day | ORAL | 1 refills | Status: DC
Start: 2020-04-29 — End: 2020-09-22

## 2020-04-29 MED ORDER — ROSUVASTATIN CALCIUM 10 MG PO TABS
10.0000 mg | ORAL_TABLET | Freq: Every day | ORAL | 1 refills | Status: DC
Start: 2020-04-29 — End: 2020-09-22

## 2020-04-29 MED ORDER — ACYCLOVIR 400 MG PO TABS
400.0000 mg | ORAL_TABLET | Freq: Every day | ORAL | 5 refills | Status: DC
Start: 2020-04-29 — End: 2021-04-28

## 2020-04-29 MED ORDER — METFORMIN HCL ER 500 MG PO TB24
500.0000 mg | ORAL_TABLET | Freq: Every day | ORAL | 1 refills | Status: DC
Start: 2020-04-29 — End: 2020-07-27

## 2020-04-29 MED ORDER — LISINOPRIL 10 MG PO TABS
ORAL_TABLET | ORAL | 1 refills | Status: DC
Start: 2020-04-29 — End: 2020-09-22

## 2020-04-29 NOTE — Progress Notes (Signed)
Subjective:    Patient ID: [name], female    DOB: 1971-01-28, 49 y.o.   MRN: [id number]  Hypertension This is a chronic problem. The current episode started more than 1 year ago. Risk factors for coronary artery disease include dyslipidemia. Treatments tried: lisinopril, lasix. There are no compliance problems.    Very nice patient Discuss recent labs Results for orders placed or performed in visit on 04/20/20  Vitamin B12  Result Value Ref Range   Vitamin B-12 509 232 - 1,245 pg/mL  CBC with Differential/Platelet  Result Value Ref Range   WBC 9.7 3.4 - 10.8 x10E3/uL   RBC 4.11 3.77 - 5.28 x10E6/uL   Hemoglobin 13.4 11.1 - 15.9 g/dL   Hematocrit 40.1 34.0 - 46.6 %   MCV 98 (H) 79 - 97 fL   MCH 32.6 26.6 - 33.0 pg   MCHC 33.4 31 - 35 g/dL   RDW 12.8 11.7 - 15.4 %   Platelets 233 150 - 450 x10E3/uL   Neutrophils 60 Not Estab. %   Lymphs 30 Not Estab. %   Monocytes 8 Not Estab. %   Eos 2 Not Estab. %   Basos 0 Not Estab. %   Neutrophils Absolute 5.8 1 - 7 x10E3/uL   Lymphocytes Absolute 2.9 0 - 3 x10E3/uL   Monocytes Absolute 0.8 0 - 0 x10E3/uL   EOS (ABSOLUTE) 0.2 0.0 - 0.4 x10E3/uL   Basophils Absolute 0.0 0 - 0 x10E3/uL   Immature Granulocytes 0 Not Estab. %   Immature Grans (Abs) 0.0 0.0 - 0.1 x10E3/uL  Comprehensive metabolic panel  Result Value Ref Range   Glucose 130 (H) 65 - 99 mg/dL   BUN 12 6 - 24 mg/dL   Creatinine, Ser 0.89 0.57 - 1.00 mg/dL   GFR calc non Af Amer 76 >59 mL/min/1.73   GFR calc Af Amer 87 >59 mL/min/1.73   BUN/Creatinine Ratio 13 9 - 23   Sodium 135 134 - 144 mmol/L   Potassium 4.1 3.5 - 5.2 mmol/L   Chloride 95 (L) 96 - 106 mmol/L   CO2 25 20 - 29 mmol/L   Calcium 9.7 8.7 - 10.2 mg/dL   Total Protein 7.5 6.0 - 8.5 g/dL   Albumin 4.6 3.8 - 4.8 g/dL   Globulin, Total 2.9 1.5 - 4.5 g/dL   Albumin/Globulin Ratio 1.6 1.2 - 2.2   Bilirubin Total 0.5 0.0 - 1.2 mg/dL   Alkaline Phosphatase 103 44 - 121 IU/L   AST 71 (H) 0 - 40  IU/L   ALT 91 (H) 0 - 32 IU/L  Folate  Result Value Ref Range   Folate 3.5 >3.0 ng/mL  Insulin, random  Result Value Ref Range   INSULIN 29.0 (H) 2.6 - 24.9 uIU/mL  T3  Result Value Ref Range   T3, Total 160 71 - 180 ng/dL  T4  Result Value Ref Range   T4, Total 7.5 4.5 - 12.0 ug/dL  TSH  Result Value Ref Range   TSH 1.530 0.450 - 4.500 uIU/mL  VITAMIN D 25 Hydroxy (Vit-D Deficiency, Fractures)  Result Value Ref Range   Vit D, 25-Hydroxy 48.2 30.0 - 100.0 ng/mL   We also discussed the lab work that she had in August. Discuss severe leg and feet cramps this occurs more so in the evening hours.  Sometimes during the day sometimes at night she has not tried anything specific for these.  She denies any major setbacks with this She is under a  lot of stress she recently lost her ex-husband to a tragic heart attack She has had some family dynamics and stress issues She is recently started going to a healthy weight clinic trying to lose weight Please see above Skin nodule - Plan: Ambulatory referral to Dermatology  Sleep apnea, unspecified type - Plan: Split night study  Diarrhea, unspecified type - Plan: Tissue transglutaminase, IgA, IgA  Leg cramps - Plan: Ferritin  Need for vaccination - Plan: Flu Vaccine QUAD 36+ mos IM  Stress  NASH (nonalcoholic steatohepatitis)  Hyperlipidemia, unspecified hyperlipidemia type  Prediabetes  She also suffers with sleep apnea she uses a machine that does help her She also has a nodule on the upper portion of her back that she would like to see dermatology She has had some frequent diarrhea watery not bloody not mucousy.  She will be due for colonoscopy in 2022 She does suffer with elevated liver enzymes-she is trying to lose weight watch her portions in her diet she is trying to keep her sugars under control Review of Systems Please see above    Objective:   Physical Exam There are papules on the upper portion of her back does not  appear cancerous but they have been present for 6 months I cannot identify what they are I recommend dermatology Neck no masses no lymphadenopathy Lungs clear no crackles no respiratory distress heart is regular no murmurs pulses normal abdomen is soft obese extremities no edema       Assessment & Plan:  1. Skin nodule Referral to dermatology - Ambulatory referral to Dermatology  2. Sleep apnea, unspecified type Very important to do the sleep machine on a regular basis but she needs a new one therefore we need to do a split-night study to qualify her for a new CPAP machine - Split night study  3. 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 Leg cramps Will check ferritin recommend vitamin B complex recommend stretches every single evening recommend stationary bike as needed - Ferritin  5. Need for vaccination Flu shot today - Flu Vaccine QUAD 36+ mos IM  6. Stress Patient not depressed but dealing with stress from the loss of her ex-husband and the impact this is having on her daughter  22. NASH (nonalcoholic steatohepatitis) Liver enzymes recently actually better I believe her diet is helping her she has not lost any weight but she will continue seeing healthy weight etc. to try to bring her weight down  8. Hyperlipidemia, unspecified hyperlipidemia type Hyperlipidemia continue medication watch diet stay active  9. Prediabetes Watch portions stay physically active minimize carbohydrates follow-up in 4 months

## 2020-04-30 ENCOUNTER — Encounter: Payer: Self-pay | Admitting: Family Medicine

## 2020-04-30 LAB — TISSUE TRANSGLUTAMINASE, IGA: Transglutaminase IgA: <2 U/mL (ref 0–3)

## 2020-04-30 LAB — IGA: IgA/Immunoglobulin A, Serum: 400 mg/dL — ABNORMAL HIGH (ref 87–352)

## 2020-04-30 LAB — FERRITIN: Ferritin: 1325 ng/mL — ABNORMAL HIGH (ref 15–150)

## 2020-05-01 LAB — SPECIMEN STATUS REPORT

## 2020-05-02 ENCOUNTER — Encounter: Payer: Self-pay | Admitting: Family Medicine

## 2020-05-04 ENCOUNTER — Other Ambulatory Visit: Payer: Self-pay

## 2020-05-04 ENCOUNTER — Ambulatory Visit (INDEPENDENT_AMBULATORY_CARE_PROVIDER_SITE_OTHER): Payer: 59 | Admitting: Family Medicine

## 2020-05-04 ENCOUNTER — Encounter (INDEPENDENT_AMBULATORY_CARE_PROVIDER_SITE_OTHER): Payer: Self-pay | Admitting: Family Medicine

## 2020-05-04 VITALS — BP 136/82 | HR 73 | Temp 97.8°F | Ht 66.0 in | Wt 202.0 lb

## 2020-05-04 DIAGNOSIS — K7581 Nonalcoholic steatohepatitis (NASH): Secondary | ICD-10-CM

## 2020-05-04 DIAGNOSIS — E7849 Other hyperlipidemia: Secondary | ICD-10-CM | POA: Diagnosis not present

## 2020-05-04 DIAGNOSIS — E559 Vitamin D deficiency, unspecified: Secondary | ICD-10-CM | POA: Diagnosis not present

## 2020-05-04 DIAGNOSIS — Z6833 Body mass index (BMI) 33.0-33.9, adult: Secondary | ICD-10-CM

## 2020-05-04 DIAGNOSIS — R7303 Prediabetes: Secondary | ICD-10-CM

## 2020-05-04 DIAGNOSIS — E669 Obesity, unspecified: Secondary | ICD-10-CM

## 2020-05-04 NOTE — Addendum Note (Signed)
Addended by: Dairl Ponder on: 05/04/2020 01:33 PM   Modules accepted: Orders

## 2020-05-04 NOTE — Progress Notes (Signed)
Chief Complaint:   OBESITY [name] is here to discuss her progress with her obesity treatment plan along with follow-up of her obesity related diagnoses. [name] is on the Category 3 Plan and states she is following her eating plan approximately 98% of the time. 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  Today's visit was #: 2 Starting weight: 205 lbs Starting date: 04/20/2020 Today's weight: 202 lbs Today's date: 05/04/2020 Total lbs lost to date: 3 Total lbs lost since last in-office visit: 3  Interim History: [name] followed the plan almost entirely. She found out that she is not allergic to gluten. She felt the quantity of food to be sufficient, and she felt dinner in particular to be a significant amount of food. She did cheese, extra yogurt, and alcohol for snack. She did not drink milk on the plan. She notes minimal cravings. She is anticipating a week of splurge type eating when away at the beach.  Subjective:   1. Pre-diabetes Samiha's last [name] was 5.9 and insulin 29.0. She notes minimal carbohydrate cravings. She is on metformin 500 mg daily.   2. NASH (nonalcoholic steatohepatitis) [name]'s last LFTs decreased from previously. She sees Dr. Hilarie Fredrickson. She has a recent increase in ferritin Li.  3. Vitamin D deficiency [name] is on 10,000 IU daily, and notes fatigue. Last Vit D Li was 48.  4. Other hyperlipidemia [name]'s last LDL was <100 and HDL <50. She is on Crestor, and she denies myalgias. Her LFTs are down trending.  Assessment/Plan:   1. Pre-diabetes Christasia will continue metformin, no change in dose, and will continue to work on weight loss, exercise, and decreasing simple carbohydrates to help decrease the risk of diabetes.   2. NASH (nonalcoholic steatohepatitis) [name] will follow up with Dr. Hilarie Fredrickson.  3. Vitamin D deficiency [name] contributes to fatigue and are associated with obesity, breast, and colon cancer. We  will repeat labs in 3 months. [name] will follow-up for routine testing of Vitamin D, at least 2-3 times per year to avoid over-replacement.  4. Other hyperlipidemia Cardiovascular risk and specific lipid/LDL goals reviewed. We discussed several lifestyle modifications today and [name] will continue to work on diet, exercise and weight loss efforts. We will repeat labs in 3 months. Orders and follow up as documented in patient record.   Counseling Intensive lifestyle modifications are the first line treatment for this issue. . Dietary changes: Increase soluble fiber. Decrease simple carbohydrates. . Exercise changes: Moderate to vigorous-intensity aerobic activity 150 minutes per week if tolerated. . Lipid-lowering medications: see documented in medical record.  5. Class 1 obesity with serious comorbidity and body mass index (BMI) of 33.0 to 33.9 in adult, unspecified obesity type [name] is currently in the action stage of change. As such, her goal is to continue with weight loss efforts. She has agreed to the Category 3 Plan.   Exercise goals: No exercise has been prescribed at this time.  Behavioral modification strategies: increasing lean protein intake, meal planning and cooking strategies, keeping healthy foods in the home and planning for success.  [name] has agreed to follow-up with our clinic in 2 weeks. She was informed of the importance of frequent follow-up visits to maximize her success with intensive lifestyle modifications for her multiple health conditions.   Objective:   Blood pressure 136/82, pulse 73, temperature 97.8 F (36.6 C), temperature source Oral, height 5' 6"  (1.676 m), weight 202 lb (91.6 kg), SpO2 98 %. Body mass  index is 32.6 kg/m.  General: Cooperative, alert, well developed, in no acute distress. HEENT: Conjunctivae and lids unremarkable. Cardiovascular: Regular rhythm.  Lungs: Normal work of breathing. Neurologic: No focal deficits.   Lab Results    Component Value Date   CREATININE 0.89 04/20/2020   BUN 12 04/20/2020   NA 135 04/20/2020   K 4.1 04/20/2020   CL 95 (L) 04/20/2020   CO2 25 04/20/2020   Lab Results  Component Value Date   ALT 91 (H) 04/20/2020   AST 71 (H) 04/20/2020   ALKPHOS 103 04/20/2020   BILITOT 0.5 04/20/2020   Lab Results  Component Value Date   HGBA1C 5.9 (H) 03/02/2020   HGBA1C 6.4 (H) 11/20/2019   HGBA1C 5.8 (H) 08/14/2019   HGBA1C 6.1 (H) 06/01/2015   HGBA1C 6.0 (H) 01/15/2015   Lab Results  Component Value Date   INSULIN 29.0 (H) 04/20/2020   Lab Results  Component Value Date   TSH 1.530 04/20/2020   Lab Results  Component Value Date   CHOL 193 03/02/2020   HDL 45 03/02/2020   LDLCALC 95 03/02/2020   TRIG 315 (H) 03/02/2020   CHOLHDL 4.3 03/02/2020   Lab Results  Component Value Date   WBC 9.7 04/20/2020   HGB 13.4 04/20/2020   HCT 40.1 04/20/2020   MCV 98 (H) 04/20/2020   PLT 233 04/20/2020   Lab Results  Component Value Date   IRON 106 04/29/2020   TIBC 316 04/29/2020   FERRITIN 1,325 (H) 04/29/2020   Attestation Statements:   Reviewed by clinician on day of visit: allergies, medications, problem list, medical history, surgical history, family history, social history, and previous encounter notes.  Time spent on visit including pre-visit chart review and post-visit care and charting was 40 minutes.    I, Trixie Dredge, am acting as transcriptionist for Coralie Common, MD.  I have reviewed the above documentation for accuracy and completeness, and I agree with the above. - Jinny Blossom, MD

## 2020-05-05 ENCOUNTER — Other Ambulatory Visit: Payer: Self-pay

## 2020-05-05 ENCOUNTER — Telehealth: Payer: Self-pay | Admitting: Internal Medicine

## 2020-05-05 LAB — IRON AND TIBC
Iron Saturation: 34 % (ref 15–55)
Iron: 106 ug/dL (ref 27–159)
Total Iron Binding Capacity: 316 ug/dL (ref 250–450)
UIBC: 210 ug/dL (ref 131–425)

## 2020-05-05 LAB — SPECIMEN STATUS REPORT

## 2020-05-05 NOTE — Telephone Encounter (Signed)
Very reasonable to consider HH. It should be noted that she has had previous liver bx which in 2011 and 2018 consistent with NASH.  Iron stains were done in 2011 and normal, but not in 2018. With ferritin that elevated I agree we should check HH gene studies and if still questionable consider repeat liver bx with quant iron 1st have her return for repeat ferritin + IBC panel, hepatic function panel and hemochromatosis gene analysis (blood test). I will use this info to determine what to do next. Thanks [employer]

## 2020-05-05 NOTE — Telephone Encounter (Signed)
Patient called states her ferritin is moderately elevated might be developing any type of hemochromatosis. Is highly concerned and is seeking advise

## 2020-05-05 NOTE — Telephone Encounter (Signed)
Orders in Epic for additional labs. Called patient and she agreed to come into our lab for them. Let her know Dr. Hilarie Fredrickson will advise after results come back

## 2020-05-11 ENCOUNTER — Other Ambulatory Visit (INDEPENDENT_AMBULATORY_CARE_PROVIDER_SITE_OTHER): Payer: 59

## 2020-05-11 LAB — HEPATIC FUNCTION PANEL
ALT: 72 U/L — ABNORMAL HIGH (ref 0–35)
AST: 53 U/L — ABNORMAL HIGH (ref 0–37)
Albumin: 4.4 g/dL (ref 3.5–5.2)
Alkaline Phosphatase: 80 U/L (ref 39–117)
Bilirubin, Direct: 0.1 mg/dL (ref 0.0–0.3)
Total Bilirubin: 0.5 mg/dL (ref 0.2–1.2)
Total Protein: 7.5 g/dL (ref 6.0–8.3)

## 2020-05-11 LAB — FERRITIN: Ferritin: 299.8 ng/mL — ABNORMAL HIGH (ref 10.0–291.0)

## 2020-05-11 LAB — IBC PANEL
Iron: 57 ug/dL (ref 42–145)
Saturation Ratios: 15.4 % — ABNORMAL LOW (ref 20.0–50.0)
Transferrin: 264 mg/dL (ref 212.0–360.0)

## 2020-05-17 ENCOUNTER — Encounter (INDEPENDENT_AMBULATORY_CARE_PROVIDER_SITE_OTHER): Payer: Self-pay | Admitting: Family Medicine

## 2020-05-18 ENCOUNTER — Telehealth (INDEPENDENT_AMBULATORY_CARE_PROVIDER_SITE_OTHER): Payer: 59 | Admitting: Family Medicine

## 2020-05-18 ENCOUNTER — Encounter (INDEPENDENT_AMBULATORY_CARE_PROVIDER_SITE_OTHER): Payer: Self-pay | Admitting: Family Medicine

## 2020-05-18 DIAGNOSIS — E669 Obesity, unspecified: Secondary | ICD-10-CM

## 2020-05-18 DIAGNOSIS — E559 Vitamin D deficiency, unspecified: Secondary | ICD-10-CM

## 2020-05-18 DIAGNOSIS — Z6832 Body mass index (BMI) 32.0-32.9, adult: Secondary | ICD-10-CM | POA: Diagnosis not present

## 2020-05-19 NOTE — Progress Notes (Signed)
TeleHealth Visit:  Due to the COVID-19 pandemic, this visit was completed with telemedicine (audio/video) technology to reduce patient and provider exposure as well as to preserve personal protective equipment.   [name] has verbally consented to this TeleHealth visit. The patient is located at home, the provider is located at the [facility] office. The participants in this visit include the listed provider and patient. The visit was conducted today via video.  Chief Complaint: OBESITY [name] is here to discuss her progress with her obesity treatment plan along with follow-up of her obesity related diagnoses. [name] is on the Category 3 Plan and states she is following her eating plan approximately 15% of the time. [name] states she is exercising 0 minutes 0 times per week.  Today's visit was #: 3 Starting weight: 205 lbs Starting date: 04/20/2020  Interim History: [name] is at home sick today. She feels this is a side effect her recent COVID booster. She will be traveling to the beach. She reports weighing 198 lbs at home today which would reflect a 4 lb weight loss since last OV. 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 She has also been off plan due to being ill with nausea post vaccine booster. She notes her hunger is satisfied when she eats food on plan. She has been eating very little - crackers and Sprite - and has no appetite.  Subjective:   Vitamin D deficiency. Last Vitamin D level nearly at goal at 48.2 on 04/20/2020. [name] is on OTC Vitamin D 10,000 IU daily.   Ref. Range 04/20/2020 11:40  Vitamin D, 25-Hydroxy Latest Ref Range: 30.0 - 100.0 ng/mL 48.2   Assessment/Plan:   Vitamin D deficiency. LShe agrees to continue to take OTC Vitamin D 10,000 IU daily and will follow-up for routine testing of Vitamin D, at least 2-3 times per year to avoid over-replacement.  Class 1 obesity with serious  comorbidity and body mass index (BMI) of 32.0 to 32.9 in adult, unspecified obesity type.  [name] is currently in the action stage of change. As such, her goal is to continue with weight loss efforts. She has agreed to the Category 3 Plan.   Exercise goals: No exercise has been prescribed at this time.  Behavioral modification strategies: increasing lean protein intake, decreasing simple carbohydrates and decreasing liquid calories.  [name] has agreed to follow-up with our clinic in 2 weeks.   Objective:   VITALS: Per patient if applicable, see vitals. GENERAL: Alert and in no acute distress. CARDIOPULMONARY: No increased WOB. Speaking in clear sentences.  PSYCH: Pleasant and cooperative. Speech normal rate and rhythm. Affect is appropriate. Insight and judgement are appropriate. Attention is focused, linear, and appropriate.  NEURO: Oriented as arrived to appointment on time with no prompting.   Lab Results  Component Value Date   CREATININE 0.89 04/20/2020   BUN 12 04/20/2020   NA 135 04/20/2020   K 4.1 04/20/2020   CL 95 (L) 04/20/2020   CO2 25 04/20/2020   Lab Results  Component Value Date   ALT 72 (H) 05/11/2020   AST 53 (H) 05/11/2020   ALKPHOS 80 05/11/2020   BILITOT 0.5 05/11/2020   Lab Results  Component Value Date   HGBA1C 5.9 (H) 03/02/2020   HGBA1C 6.4 (H) 11/20/2019   HGBA1C 5.8 (H) 08/14/2019   HGBA1C 6.1 (H) 06/01/2015   HGBA1C 6.0 (H) 01/15/2015   Lab Results  Component Value Date   INSULIN 29.0 (H) 04/20/2020   Lab Results  Component Value Date   TSH 1.530 04/20/2020   Lab Results  Component Value Date   CHOL 193 03/02/2020   HDL 45 03/02/2020   LDLCALC 95 03/02/2020   TRIG 315 (H) 03/02/2020   CHOLHDL 4.3 03/02/2020   Lab Results  Component Value Date   WBC 9.7 04/20/2020   HGB 13.4 04/20/2020   HCT 40.1 04/20/2020   MCV 98 (H) 04/20/2020   PLT 233 04/20/2020   Lab Results  Component Value Date   IRON 57 05/11/2020   TIBC 316  04/29/2020   FERRITIN 299.8 (H) 05/11/2020   Attestation Statements:   Reviewed by clinician on day of visit: allergies, medications, problem list, medical history, surgical history, family history, social history, and previous encounter notes.  IMichaelene Song, am acting as Location manager for [employer], FNP-C   I have reviewed the above documentation for accuracy and completeness, and I agree with the above. - Georgianne Fick, FNP

## 2020-05-20 ENCOUNTER — Encounter (INDEPENDENT_AMBULATORY_CARE_PROVIDER_SITE_OTHER): Payer: Self-pay | Admitting: Family Medicine

## 2020-05-20 DIAGNOSIS — E559 Vitamin D deficiency, unspecified: Secondary | ICD-10-CM | POA: Insufficient documentation

## 2020-05-20 LAB — HEMOCHROMATOSIS DNA-PCR(C282Y,H63D)

## 2020-05-27 ENCOUNTER — Other Ambulatory Visit: Payer: Self-pay

## 2020-05-27 DIAGNOSIS — K7581 Nonalcoholic steatohepatitis (NASH): Secondary | ICD-10-CM

## 2020-06-30 ENCOUNTER — Encounter: Payer: Self-pay | Admitting: Family Medicine

## 2020-07-01 ENCOUNTER — Other Ambulatory Visit: Payer: Self-pay | Admitting: *Deleted

## 2020-07-01 DIAGNOSIS — G4730 Sleep apnea, unspecified: Secondary | ICD-10-CM

## 2020-07-01 NOTE — Telephone Encounter (Signed)
Nurses Unfortunately it does not appear that the referral went any further then [place] putting in the referral to begin with.  It does appear from the medical records that the referral was sent-but the patient relates she has not heard anything. Please reinitiate the referral. (This referral gets sent to the people who do the sleep studies and they are supposed to call the patient to schedule this-unfortunately we are not allowed to directly schedule the sleep study) Also inform [place] that this is been done in if she has not heard anything from this new referral within the next 14 to 21 days for her to please let us know.

## 2020-07-05 ENCOUNTER — Other Ambulatory Visit: Payer: Self-pay | Admitting: Family Medicine

## 2020-07-07 ENCOUNTER — Other Ambulatory Visit: Payer: Self-pay | Admitting: Family Medicine

## 2020-07-26 ENCOUNTER — Other Ambulatory Visit: Payer: Self-pay | Admitting: Family Medicine

## 2020-07-27 ENCOUNTER — Other Ambulatory Visit: Payer: Self-pay | Admitting: Family Medicine

## 2020-07-29 ENCOUNTER — Other Ambulatory Visit: Payer: Self-pay

## 2020-07-29 ENCOUNTER — Ambulatory Visit (HOSPITAL_BASED_OUTPATIENT_CLINIC_OR_DEPARTMENT_OTHER): Payer: 59 | Attending: Family Medicine | Admitting: Internal Medicine

## 2020-07-29 VITALS — Ht 65.0 in | Wt 200.0 lb

## 2020-07-29 DIAGNOSIS — G4733 Obstructive sleep apnea (adult) (pediatric): Secondary | ICD-10-CM | POA: Insufficient documentation

## 2020-07-29 DIAGNOSIS — G4730 Sleep apnea, unspecified: Secondary | ICD-10-CM | POA: Diagnosis present

## 2020-07-29 DIAGNOSIS — R0902 Hypoxemia: Secondary | ICD-10-CM | POA: Diagnosis not present

## 2020-08-08 DIAGNOSIS — G4730 Sleep apnea, unspecified: Secondary | ICD-10-CM | POA: Diagnosis not present

## 2020-08-08 NOTE — Procedures (Signed)
    Patient Name: [name], [name] Date: 07/29/2020 Gender: Female D.O.B: 10-06-1970 Age (years): 49 Referring Provider: [name] Height (inches): 65 Interpreting Physician: Baird Lyons MD, ABSM Weight (lbs): 200 RPSGT: Jacolyn Reedy BMI: 33 MRN: [id number] Neck Size:   CLINICAL INFORMATION Indication for sleep study:  Snoring Epworth Sleepiness Score: 10  SLEEP STUDY TECHNIQUE A multi-channel overnight portable sleep study was performed. The channels recorded were: nasal airflow, thoracic respiratory movement, and oxygen saturation with a pulse oximetry. Snoring was also monitored.  MEDICATIONS Patient self administered medications include: none reported.  SLEEP ARCHITECTURE Patient was studied for 658.5 minutes. The sleep efficiency was 99.7 % and the patient was supine for 0%. The arousal index was 0.0 per hour.  RESPIRATORY PARAMETERS The overall AHI was 17.4 per hour, with a central apnea index of 0.0 per hour. The oxygen nadir was 81% during sleep.  CARDIAC DATA Mean heart rate during sleep was 73.2 bpm.  IMPRESSIONS - Moderate obstructive sleep apnea occurred during this study (AHI = 17.4/h). - No significant central sleep apnea occurred during this study (CAI = 0.0/h). - Oxygen desaturation was noted during this study (Min O2 = 81%). Mean O2 sat 92%. Time with O2 sat 89% or less was 10.3 minutes. - Patient snored.  DIAGNOSIS - Obstructive Sleep Apnea (G47.33) - Nocturnal Hypoxemia (G47.36)  RECOMMENDATIONS - Suggest CPAP titration sleep study or autopap. Other options including a fitted oral appliance or ENT evaluation, would be based on clinical judgment. - Be careful with alcohol, sedatives and other CNS depressants that may worsen sleep apnea and disrupt normal sleep architecture. - Sleep hygiene should be reviewed to assess factors that may improve sleep quality. - Weight management and regular exercise should be initiated or  continued.  [Electronically signed] 08/08/2020 12:45 PM  Baird Lyons MD, [place], American Board of Sleep Medicine   NPI: [id number]                         [place], [place] [facility]  ELECTRONICALLY SIGNED ON:  08/08/2020, 12:41 PM [place] PH: [phone]   FX: [phone] [place]

## 2020-08-24 ENCOUNTER — Encounter: Payer: Self-pay | Admitting: Internal Medicine

## 2020-08-24 ENCOUNTER — Ambulatory Visit (INDEPENDENT_AMBULATORY_CARE_PROVIDER_SITE_OTHER): Payer: 59 | Admitting: Internal Medicine

## 2020-08-24 ENCOUNTER — Other Ambulatory Visit: Payer: Self-pay

## 2020-08-24 ENCOUNTER — Other Ambulatory Visit (INDEPENDENT_AMBULATORY_CARE_PROVIDER_SITE_OTHER): Payer: 59

## 2020-08-24 VITALS — BP 142/90 | HR 83 | Ht 65.0 in | Wt 207.0 lb

## 2020-08-24 DIAGNOSIS — K7581 Nonalcoholic steatohepatitis (NASH): Secondary | ICD-10-CM | POA: Diagnosis not present

## 2020-08-24 DIAGNOSIS — Z800 Family history of malignant neoplasm of digestive organs: Secondary | ICD-10-CM | POA: Diagnosis not present

## 2020-08-24 LAB — HEPATIC FUNCTION PANEL
ALT: 57 U/L — ABNORMAL HIGH (ref 0–35)
AST: 45 U/L — ABNORMAL HIGH (ref 0–37)
Albumin: 4.5 g/dL (ref 3.5–5.2)
Alkaline Phosphatase: 100 U/L (ref 39–117)
Bilirubin, Direct: 0.1 mg/dL (ref 0.0–0.3)
Total Bilirubin: 0.7 mg/dL (ref 0.2–1.2)
Total Protein: 7.8 g/dL (ref 6.0–8.3)

## 2020-08-24 LAB — PROTIME-INR
INR: 1.1 ratio — ABNORMAL HIGH (ref 0.8–1.0)
Prothrombin Time: 12.1 s (ref 9.6–13.1)

## 2020-08-24 LAB — FERRITIN: Ferritin: 224.6 ng/mL (ref 10.0–291.0)

## 2020-08-24 LAB — IBC PANEL
Iron: 79 ug/dL (ref 42–145)
Saturation Ratios: 22.8 % (ref 20.0–50.0)
Transferrin: 247 mg/dL (ref 212.0–360.0)

## 2020-08-24 NOTE — Progress Notes (Signed)
Subjective:    Patient ID: [name], female    DOB: 1970-09-16, 50 y.o.   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  She is here alone today and I last saw her on March 25, 2020.  She reports that she has developed a rash which has been pruritic but also at times painful.  Started on bilateral hands and she was seen at [facility] dermatology.  She has been diagnosed by biopsy with granuloma annulare.  She was given a steroid cream a few weeks ago and is not sure that this has made noticeable difference to this time.  From a GI perspective no new issues.  No abdominal pain.  No itching.  No jaundice.  No increasing abdominal girth or lower extremity edema.  She reports that she is trying to lose weight but has not been able to achieve sustained weight loss.  She does continue Metformin therapy.  She is also on statin therapy, lisinopril and Lasix.  She was considering a NASH clinical trial in [place], [place] but on further follow-up she does not think that she will enroll in such a trial.   Review of Systems As per HPI, otherwise negative  Current Medications, Allergies, Past Medical History, Past Surgical History, Family History and Social History were reviewed in [employer] record.     Objective:   Physical Exam BP (!) 142/90   Pulse 83   Ht 5' 5"  (1.651 m)   Wt 207 lb (93.9 kg)   SpO2 98%   BMI 34.45 kg/m  Gen: awake, alert, NAD HEENT: anicteric  CV: RRR, no mrg Pulm: CTA b/l Abd: soft, NT/ND, +BS throughout Ext: no c/c/e Neuro: nonfocal Skin: Erythematous papules bilateral extensor surfaces on the top of her hand, also very faint at the hairline on her forehead and across her upper back and at the base of the  neck  CMP     Component Value Date/Time   NA 135 04/20/2020 1140   K 4.1 04/20/2020 1140   CL 95 (L) 04/20/2020 1140   CO2 25 04/20/2020 1140   GLUCOSE 130 (H) 04/20/2020 1140   GLUCOSE 104 (H) 08/31/2016 1109   BUN 12 04/20/2020 1140   CREATININE 0.89 04/20/2020 1140   CREATININE 0.82 01/15/2015 2351   CALCIUM 9.7 04/20/2020 1140   PROT 7.8 08/24/2020 1611   PROT 7.5 04/20/2020 1140   ALBUMIN 4.5 08/24/2020 1611   ALBUMIN 4.6 04/20/2020 1140   AST 45 (H) 08/24/2020 1611   ALT 57 (H) 08/24/2020 1611   ALKPHOS 100 08/24/2020 1611   BILITOT 0.7 08/24/2020 1611   BILITOT 0.5 04/20/2020 1140   GFRNONAA 76 04/20/2020 1140   GFRAA 87 04/20/2020 1140   Lab Results  Component Value Date   INR 1.1 (H) 08/24/2020   INR 1.0 08/25/2016   INR 1.1 (H) 03/15/2016   CBC    Component Value Date/Time   WBC 9.7 04/20/2020 1140   WBC 8.1 08/31/2016 1109   RBC 4.11 04/20/2020 1140   RBC 4.41 08/31/2016 1109   HGB 13.4 04/20/2020 1140   HCT 40.1 04/20/2020 1140   PLT 233 04/20/2020 1140   MCV 98 (H) 04/20/2020 1140   MCH 32.6 04/20/2020 1140   MCH 29.9 08/31/2016 1109   MCHC 33.4 04/20/2020 1140  MCHC 33.0 08/31/2016 1109   RDW 12.8 04/20/2020 1140   LYMPHSABS 2.9 04/20/2020 1140   MONOABS 0.8 08/31/2016 1109   EOSABS 0.2 04/20/2020 1140   BASOSABS 0.0 04/20/2020 1140       Assessment & Plan:  50 year old female with a history 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   1. NASH (biopsy proven, F2 2018) --we discussed her nonalcoholic fatty liver disease together again today.  To this point she has been unsuccessful in significant weight loss.  Gastric bypass surgery can be considered in patients with significant fatty liver inflammation.  She has thought about this but reports that her husband is very much opposed to having her consider such  as surgery.  He has heard complications from people that he knows that is undergoing weight loss surgery. I did discuss the importance of controlling the 3 major metabolic risk factors for her which would be blood pressure, blood sugar and cholesterol.  Also important is slow and steady weight reduction through diet, focusing on a low carbohydrate diet rich in fruits and vegetables and regular exercise.  I do think Vitamin E while not definitively proven to help people with diabetes, I do not think it will harm.  She also may want to consider discussing Ozempic with her primary care provider as this can lead to significant weight loss for some patients assuming that it would be indicated for her diabetes --Vitamin E 800 IU daily --Risk factor modifications as discussed above --Consideration of [facility] referral for clinical trials associated with NASH --CBC, CMP and INR today  2.  CRC screening --normal colonoscopy April 2017.  Family history of colon cancer.  Repeat colonoscopy recommended after April 2022  30 minutes total spent today including patient facing time, coordination of care, reviewing medical history/procedures/pertinent radiology studies, and documentation of the encounter.

## 2020-08-24 NOTE — Patient Instructions (Addendum)
Your provider has requested that you go to the basement level for lab work before leaving today. Press "B" on the elevator. The lab is located at the first door on the left as you exit the elevator. (INR, Hepatic function panel, ferritin, IBC)  Please follow up with Dr Hilarie Fredrickson in the office in 3 months.  Please purchase the following medications over the counter and take as directed: Vitamin E 800 IU once daily  Work on weight loss through diet and exercise to help with your fatty liver disease.  Lets work aggressively to control your cholesterol, blood sugars and blood pressures. This is also very important in controlling fatty liver disease.  If you are age 67 or older, your body mass index should be between 23-30. Your Body mass index is 34.45 kg/m. If this is out of the aforementioned range listed, please consider follow up with your Primary Care Provider.  If you are age 46 or younger, your body mass index should be between 19-25. Your Body mass index is 34.45 kg/m. If this is out of the aformentioned range listed, please consider follow up with your Primary Care Provider.   Due to recent changes in healthcare laws, you may see the results of your imaging and laboratory studies on MyChart before your provider has had a chance to review them.  We understand that in some cases there may be results that are confusing or concerning to you. Not all laboratory results come back in the same time frame and the provider may be waiting for multiple results in order to interpret others.  Please give [place] 48 hours in order for your provider to thoroughly review all the results before contacting the office for clarification of your results.

## 2020-08-25 ENCOUNTER — Encounter: Payer: Self-pay | Admitting: Internal Medicine

## 2020-09-02 ENCOUNTER — Encounter: Payer: Self-pay | Admitting: Family Medicine

## 2020-09-02 ENCOUNTER — Ambulatory Visit: Payer: 59 | Admitting: Family Medicine

## 2020-09-02 ENCOUNTER — Other Ambulatory Visit: Payer: Self-pay

## 2020-09-02 VITALS — BP 126/82 | HR 94 | Temp 97.5°F | Ht 65.0 in | Wt 208.0 lb

## 2020-09-02 DIAGNOSIS — J040 Acute laryngitis: Secondary | ICD-10-CM

## 2020-09-02 DIAGNOSIS — E785 Hyperlipidemia, unspecified: Secondary | ICD-10-CM

## 2020-09-02 DIAGNOSIS — J019 Acute sinusitis, unspecified: Secondary | ICD-10-CM

## 2020-09-02 DIAGNOSIS — K7581 Nonalcoholic steatohepatitis (NASH): Secondary | ICD-10-CM | POA: Diagnosis not present

## 2020-09-02 DIAGNOSIS — I10 Essential (primary) hypertension: Secondary | ICD-10-CM

## 2020-09-02 DIAGNOSIS — L920 Granuloma annulare: Secondary | ICD-10-CM

## 2020-09-02 DIAGNOSIS — G4733 Obstructive sleep apnea (adult) (pediatric): Secondary | ICD-10-CM

## 2020-09-02 DIAGNOSIS — E669 Obesity, unspecified: Secondary | ICD-10-CM

## 2020-09-02 DIAGNOSIS — E119 Type 2 diabetes mellitus without complications: Secondary | ICD-10-CM

## 2020-09-02 MED ORDER — CLARITHROMYCIN 500 MG PO TABS
500.0000 mg | ORAL_TABLET | Freq: Two times a day (BID) | ORAL | 0 refills | Status: DC
Start: 2020-09-02 — End: 2020-10-09

## 2020-09-02 NOTE — Progress Notes (Signed)
Subjective:    Patient ID: [name], female    DOB: 16-May-1971, 50 y.o.   MRN: [id number]  HPI  Patient presents today with respiratory illness Number of days present-8 days   Symptoms include- headache, chills, cough, sore throat, low grade fever.   Presence of worrisome signs (severe shortness of breath, lethargy, etc.) -  Recent/current visit to urgent care or ER-went to [facility] care one 08/26/20.   Recent direct exposure to Covid- none  Any current Covid testing- covid test one week ago at urgent care and it was negative.   Has been diagnosed with granuloma annulare.   Essential hypertension - Plan: Basic metabolic panel  Obstructive sleep apnea syndrome - Plan: Ambulatory referral to ENT  NASH (nonalcoholic steatohepatitis)  Hyperlipidemia, unspecified hyperlipidemia type - Plan: Lipid panel  Type 2 diabetes mellitus without complication, without long-term current use of insulin (HCC) - Plan: Hemoglobin [name]  Granuloma annulare  Laryngitis Patient has had respiratory illness over the past 8 days negative Covid test has lost her voice relates a lot of sinus congestion drainage coughing denies high fever chills sweats  Patient has sleep apnea her study shows that she has this patient states she gets very claustrophobic with any type of facial apparatus.  She is interested in ENT approach for the new stimulator that they have  She also has [name] she follows with GI.  She needs to lose weight she has tried everything to lose weight and has not been successful in doing so.  She states she will do the lab work in the near future in addition to this she is interested in Borup in place of the Metformin we will need to do some baseline labs before making this transition  History of hyperlipidemia takes her medication watch his diet need to check up-to-date labs.  Diabetes on treatment with Metformin tolerating this well but there is a possibility we will switch  this over to Ozempic   Review of Systems  Constitutional: Negative for activity change, appetite change, fatigue and fever.  HENT: Positive for congestion and rhinorrhea. Negative for ear pain.   Eyes: Negative for discharge.  Respiratory: Positive for cough. Negative for shortness of breath and wheezing.   Cardiovascular: Negative for chest pain and leg swelling.  Gastrointestinal: Negative for abdominal pain and diarrhea.  Endocrine: Negative for polydipsia and polyphagia.  Skin: Negative for color change.  Neurological: Negative for dizziness and weakness.  Psychiatric/Behavioral: Negative for behavioral problems and confusion.       Objective:   Physical Exam Vitals reviewed.  Constitutional:      General: She is not in acute distress.    Appearance: She is well-nourished.  HENT:     Head: Normocephalic and atraumatic.  Eyes:     General:        Right eye: No discharge.        Left eye: No discharge.  Neck:     Trachea: No tracheal deviation.  Cardiovascular:     Rate and Rhythm: Normal rate and regular rhythm.     Heart sounds: Normal heart sounds. No murmur heard.   Pulmonary:     Effort: Pulmonary effort is normal. No respiratory distress.     Breath sounds: Normal breath sounds.  Musculoskeletal:        General: No edema.  Lymphadenopathy:     Cervical: No cervical adenopathy.  Skin:    General: Skin is warm and dry.  Neurological:  Mental Status: She is alert.     Coordination: Coordination normal.  Psychiatric:        Mood and Affect: Mood and affect normal.        Behavior: Behavior normal.           Assessment & Plan:  1. Essential hypertension Blood pressure decent control continue current measures watch diet - Basic metabolic panel  2. Obstructive sleep apnea syndrome Has sleep apnea referral to ENT for inspire stimulator evaluation patient cannot tolerate facial apparatuses for her CPAP - Ambulatory referral to ENT  3. NASH  (nonalcoholic steatohepatitis) Severe NASH going on looking at the possibility of being involved in a study at [facility] we will also look at the possibility of switching over to [place] she will do some baseline labs then we will go from there  4. Hyperlipidemia, unspecified hyperlipidemia type Continue current measures watch diet stay active - Lipid panel  5. Type 2 diabetes mellitus without complication, without long-term current use of insulin ([place]) Watch diet stay active try to lose weight potentially switch from Metformin to Ozempic - Hemoglobin [name]  6. Granuloma annulare Followed by dermatology they may try PUVA but for now they are doing topicals  7. Laryngitis This is a consequence of the sinusitis should gradually go away over the next 7 to 10 days  8. Acute rhinosinusitis Antibiotics prescribed warning signs discussed follow-up if problems  9. Obesity (BMI 30.0-34.9) Watch diet consider Ozempic stay physically active try to bring weight down  Follow-up in 3 months

## 2020-09-03 ENCOUNTER — Telehealth: Payer: Self-pay | Admitting: *Deleted

## 2020-09-03 ENCOUNTER — Encounter: Payer: Self-pay | Admitting: Family Medicine

## 2020-09-03 LAB — BASIC METABOLIC PANEL
BUN/Creatinine Ratio: 20 (ref 9–23)
BUN: 17 mg/dL (ref 6–24)
CO2: 21 mmol/L (ref 20–29)
Calcium: 9.3 mg/dL (ref 8.7–10.2)
Chloride: 100 mmol/L (ref 96–106)
Creatinine, Ser: 0.84 mg/dL (ref 0.57–1.00)
GFR calc Af Amer: 93 mL/min/{1.73_m2} (ref >59)
GFR calc non Af Amer: 81 mL/min/{1.73_m2} (ref >59)
Glucose: 125 mg/dL — ABNORMAL HIGH (ref 65–99)
Potassium: 4.3 mmol/L (ref 3.5–5.2)
Sodium: 139 mmol/L (ref 134–144)

## 2020-09-03 LAB — LIPID PANEL
Chol/HDL Ratio: 4.7 ratio — ABNORMAL HIGH (ref 0.0–4.4)
Cholesterol, Total: 177 mg/dL (ref 100–199)
HDL: 38 mg/dL — ABNORMAL LOW (ref >39)
LDL Chol Calc (NIH): 84 mg/dL (ref 0–99)
Triglycerides: 336 mg/dL — ABNORMAL HIGH (ref 0–149)
VLDL Cholesterol Cal: 55 mg/dL — ABNORMAL HIGH (ref 5–40)

## 2020-09-03 LAB — HEMOGLOBIN A1C
Est. average glucose Bld gHb Est-mCnc: 137 mg/dL
Hgb A1c MFr Bld: 6.4 % — ABNORMAL HIGH (ref 4.8–5.6)

## 2020-09-03 NOTE — Telephone Encounter (Signed)
-----   Message from [name], [place] sent at 08/28/2020  8:28 AM EST -----  ----- Message ----- From: Jerene Bears, MD Sent: 08/25/2020   5:46 PM EST To: [name], CMA  Pt needs screening colon for FH after October 23, 2020 She is aware and agreeable Thanks JMP

## 2020-09-03 NOTE — Telephone Encounter (Signed)
I have attempted to reach patient to schedule April colonoscopy. Unfortunately, I get no answer and voicemail is full. I will attempt to call back at a later time.

## 2020-09-04 NOTE — Telephone Encounter (Signed)
I have attempted to reach patient but no answer and voicemail is full. Will attempt once more to reach her at a later time.

## 2020-09-06 ENCOUNTER — Encounter: Payer: Self-pay | Admitting: Family Medicine

## 2020-09-07 ENCOUNTER — Encounter: Payer: Self-pay | Admitting: Internal Medicine

## 2020-09-07 ENCOUNTER — Other Ambulatory Visit: Payer: Self-pay | Admitting: *Deleted

## 2020-09-07 MED ORDER — OZEMPIC (0.25 OR 0.5 MG/DOSE) 2 MG/1.5ML ~~LOC~~ SOPN: PEN_INJECTOR | SUBCUTANEOUS | 2 refills | Status: DC

## 2020-09-07 NOTE — Telephone Encounter (Signed)
Patient is scheduled for previsit and colonoscopy in April 2022.

## 2020-09-07 NOTE — Telephone Encounter (Signed)
I have left a voicemail for patient to call back.

## 2020-09-20 ENCOUNTER — Other Ambulatory Visit: Payer: Self-pay | Admitting: Family Medicine

## 2020-10-04 ENCOUNTER — Telehealth: Payer: Self-pay | Admitting: Family Medicine

## 2020-10-04 NOTE — Telephone Encounter (Signed)
Nurses Please discussed with [place] We did try to get Ozempic approved. Please see denial sheet They stated that they would not consider it unless being on Metformin 2550 mg daily and having an [name] of 7 or above Because her current [name] is 6.4 they have excluded this medication. If she is interested we can increase the dose of the Metformin to twice daily That would help bring [name] further down but may not necessarily help in regards to weight loss  It is her right to connect with her insurance company to see if they would offer an exemption given her circumstances of fatty [place], and obesity Unfortunately though they have rejected our prior approval attempt Thanks-Dr. Nicki Reaper

## 2020-10-05 MED ORDER — METFORMIN HCL ER 500 MG PO TB24: ORAL_TABLET | ORAL | 0 refills | Status: DC

## 2020-10-05 NOTE — Telephone Encounter (Signed)
She has a choice she can do 500 mg XL Metformin Choice #1-take 1 of these tablets twice daily Choice #2 take 2 every morning  Please send in 180 tablets with 1 refill

## 2020-10-05 NOTE — Telephone Encounter (Signed)
Pt is on 24 hour tablet. Do you want her to take that one bid?

## 2020-10-05 NOTE — Addendum Note (Signed)
Addended by: Dairl Ponder on: 10/05/2020 02:56 PM   Modules accepted: Orders

## 2020-10-05 NOTE — Telephone Encounter (Signed)
Prescription sent electronically to pharmacy. Patient notified and verbalized understanding.  Patient would also like a refill of her macrodantin in 30 day supply- states it is cheaper than 90 day supply

## 2020-10-05 NOTE — Telephone Encounter (Signed)
Discussed with pt. Pt verbalized understanding. Would like metformin sent to [facility] [address] in [place]

## 2020-10-06 NOTE — Telephone Encounter (Signed)
Patient states her urologist has had patient on this medication for 6 years and is this not safe for her to be on like this - she is now concerned they just refilled it for her to continue with it but should she not be taking it as they advised- please advise

## 2020-10-06 NOTE — Telephone Encounter (Signed)
Per Pharmacy last prescribed by Dr Harold Barban -Urology

## 2020-10-06 NOTE — Telephone Encounter (Signed)
Nurses This is a medication that I do not prescribe long-term It would be up to her urologist to prescribe this if she chooses to continue.  But she should be aware of the following.  Please make sure the patient is aware that this medication long-term has been associated with pulmonary inflammation and in some cases lung/breathing capacity damage.  Therefore I do not utilize this medicine on a ongoing basis.

## 2020-10-06 NOTE — Telephone Encounter (Signed)
So this is not a medication that I utilize long-term.  I would be concerned if I was the patient.  They should consider a different antibiotic as a suppressive antibiotic.  It is unlikely she has had any damage if her breathing is doing fine.  But long-term use increases her risk for lung related issues.

## 2020-10-06 NOTE — Telephone Encounter (Signed)
Patient notified. Patient stated she will talk with her Urologist to discuss alternative suppressive antibiotic treatment.

## 2020-10-06 NOTE — Telephone Encounter (Signed)
Autumn This medication is listed as a historical medication.  Please connect with pharmacy find out who was prescribing this.  Then let me know thank you

## 2020-10-07 ENCOUNTER — Other Ambulatory Visit: Payer: Self-pay | Admitting: Family Medicine

## 2020-10-07 NOTE — Telephone Encounter (Signed)
Med check up 09/02/20

## 2020-10-08 ENCOUNTER — Telehealth: Payer: Self-pay

## 2020-10-08 NOTE — Telephone Encounter (Signed)
Incorrect insurance on file, pharmacy to reach out to pt for info

## 2020-10-09 ENCOUNTER — Ambulatory Visit: Payer: 59 | Admitting: Family Medicine

## 2020-10-09 ENCOUNTER — Encounter: Payer: Self-pay | Admitting: Family Medicine

## 2020-10-09 ENCOUNTER — Other Ambulatory Visit: Payer: Self-pay

## 2020-10-09 VITALS — BP 136/84 | HR 95 | Temp 97.3°F | Ht 65.0 in | Wt 213.0 lb

## 2020-10-09 DIAGNOSIS — T7840XA Allergy, unspecified, initial encounter: Secondary | ICD-10-CM | POA: Insufficient documentation

## 2020-10-09 DIAGNOSIS — L299 Pruritus, unspecified: Secondary | ICD-10-CM

## 2020-10-09 MED ORDER — FAMOTIDINE 20 MG PO TABS: 20.0000 mg | ORAL_TABLET | Freq: Two times a day (BID) | ORAL | 0 refills | Status: DC

## 2020-10-09 MED ORDER — PREDNISONE 20 MG PO TABS
ORAL_TABLET | ORAL | 0 refills | Status: DC
Start: 2020-10-09 — End: 2020-12-02

## 2020-10-09 NOTE — Progress Notes (Signed)
Patient ID: [name], female    DOB: [birth year], 50 y.o.   MRN: [id number]   Chief Complaint  Patient presents with  . itching   Subjective:  CC: itching after taking Cefdinir  This is a new problem.  Presents today with extreme itching after starting cefdinir antibiotic last Thursday.  Symptoms started 2 days after on Saturday.  Has tried Benadryl 4 tablets every 2-3 hours since that time.  Denies hives, rash, angioedema, difficulty breathing.  Is continually scratching all over body throughout the visit.  Antibiotic was prescribed by urgent care for bronchitis.   pt arrives for severe itching. Started on cefdinir on 3/10. Itching started on 3/12. Tried benadryl. 4 every 2 -3 hours.    Medical History [name] has a past medical history of Allergy, Anemia, Anxiety, Arthritis, Asthma, Back pain, Bilateral swelling of feet, Complication of anesthesia, Diastolic dysfunction (28/7867), Herpes infection, High cholesterol, History of bronchitis, History of migraine, Hypertension, IBS (irritable bowel syndrome), Inflammation of hair follicles, Insomnia, Joint pain, Lactose intolerance, Liver fibrosis, Menopausal state, Migraine, NASH (nonalcoholic steatohepatitis), Prediabetes (01/01/2020), Sleep apnea, Sleep apnea, Spastic bladder, Tingling, and UTI (urinary tract infection).   Outpatient Encounter Medications as of 10/09/2020  Medication Sig  . acyclovir (ZOVIRAX) 400 MG tablet Take 1 tablet (400 mg total) by mouth daily.  Marland Kitchen albuterol (VENTOLIN HFA) 108 (90 Base) MCG/ACT inhaler INHALE 2 PUFFS BY MOUTH EVERY 6 HOURS AS NEEDED FOR WHEEZE  . Cholecalciferol (VITAMIN D3) 125 MCG (5000 UT) CAPS Take 5,000 Units by mouth. Taking 2 per day  . citalopram (CELEXA) 40 MG tablet TAKE 1 TABLET BY MOUTH EVERY DAY  . clonazePAM (KLONOPIN) 1 MG tablet TAKE ONE HALF TO ONE TABLET TWICE A DAY AS NEEDED  . Coenzyme Q10 (COQ10 PO) Take 300 mg by mouth.  . cyclobenzaprine (FLEXERIL) 10 MG tablet Take  0.5-1 tablets (5-10 mg total) by mouth 3 (three) times daily as needed for muscle spasms.  Marland Kitchen estradiol (ESTRACE) 2 MG tablet TAKE 1 TABLET BY MOUTH EVERY DAY  . famotidine (PEPCID) 20 MG tablet Take 1 tablet (20 mg total) by mouth 2 (two) times daily.  . fluticasone (FLONASE) 50 MCG/ACT nasal spray USE 2 SPRAYS IN EACH NOSTRIL EVERY DAY  . furosemide (LASIX) 20 MG tablet TAKE ONE TABLET BY MOUTH EACH MORNING.  Marland Kitchen ibuprofen (ADVIL,MOTRIN) 800 MG tablet Take 800 mg by mouth every 8 (eight) hours as needed. for pain  . Krill Oil 500 MG CAPS Take by mouth.  Marland Kitchen lisinopril (ZESTRIL) 10 MG tablet TAKE 2 TABLETS BY MOUTH EVERY DAY  . metFORMIN (GLUCOPHAGE-XR) 500 MG 24 hr tablet TAKE 1 TABLET BY MOUTH TWICE A DAY  . MYRBETRIQ 50 MG TB24 tablet TAKE 1 TABLET BY MOUTH EVERY DAY  . nitrofurantoin (MACRODANTIN) 100 MG capsule Take 100 mg by mouth at bedtime.   . predniSONE (DELTASONE) 20 MG tablet Take 3 tablets by mouth for 3 days, then 2 tablets by mouth for 3 days, then 1 tablet by mouth for 3 days.  . rosuvastatin (CRESTOR) 10 MG tablet TAKE 1 TABLET BY MOUTH EVERY DAY  . valACYclovir (VALTREX) 1000 MG tablet TAKE 2 TABLETS BY MOUTH NOW THEN 2 TABLETS BY MOUTH 12 HOURS LATER  . [DISCONTINUED] Semaglutide,0.25 or 0.5MG/DOS, (OZEMPIC, 0.25 OR 0.5 MG/DOSE,) 2 MG/1.5ML SOPN 0.37m once weekly as directed  . [DISCONTINUED] clarithromycin (BIAXIN) 500 MG tablet Take 1 tablet (500 mg total) by mouth 2 (two) times daily.   No facility-administered encounter medications  on file as of 10/09/2020.     Review of Systems  Constitutional: Negative for chills and fever.  Respiratory: Negative for shortness of breath.        Denies any issues with breathing.   Cardiovascular: Negative for chest pain.  Skin:       Pruritic after taking cefdinir     Vitals BP 136/84   Pulse 95   Temp (!) 97.3 F (36.3 C)   Ht 5' 5"  (1.651 m)   Wt 213 lb (96.6 kg)   SpO2 95%   BMI 35.45 kg/m   Objective:   Physical  Exam Vitals reviewed.  Constitutional:      Appearance: Normal appearance.  Cardiovascular:     Rate and Rhythm: Normal rate and regular rhythm.     Heart sounds: Normal heart sounds.  Pulmonary:     Effort: Pulmonary effort is normal.     Breath sounds: Normal breath sounds.     Comments: Denies difficulty breathing, swelling, angioedema. Skin:    General: Skin is warm and dry.     Comments: Constant scratching throughout visit, no visible rash, areas noted where skin was broken from the excessive scratching.  Neurological:     General: No focal deficit present.     Mental Status: She is alert.  Psychiatric:        Behavior: Behavior normal.      Assessment and Plan   1. Pruritic condition - famotidine (PEPCID) 20 MG tablet; Take 1 tablet (20 mg total) by mouth 2 (two) times daily.  Dispense: 28 tablet; Refill: 0 - predniSONE (DELTASONE) 20 MG tablet; Take 3 tablets by mouth for 3 days, then 2 tablets by mouth for 3 days, then 1 tablet by mouth for 3 days.  Dispense: 18 tablet; Refill: 0  2. Allergic reaction to drug, initial encounter - famotidine (PEPCID) 20 MG tablet; Take 1 tablet (20 mg total) by mouth 2 (two) times daily.  Dispense: 28 tablet; Refill: 0 - predniSONE (DELTASONE) 20 MG tablet; Take 3 tablets by mouth for 3 days, then 2 tablets by mouth for 3 days, then 1 tablet by mouth for 3 days.  Dispense: 18 tablet; Refill: 0   Cefdinir added to allergy list on EMR.  Instructed to avoid all cephalosporins.  Instructed to continue taking Benadryl according to package instructions, will add Pepcid for 2 weeks and  begin prednisone taper today.  Agrees with plan of care discussed today. Understands warning signs to seek further care: chest pain, shortness of breath, any significant change in health.  Understands to follow-up  if symptoms worsen, do not improve.  Instructed if any issues with swallowing, facial edema difficulty breathing, report to the emergency room  immediately.  Pecolia Ades, NP 10/09/2020

## 2020-10-09 NOTE — Patient Instructions (Signed)
Continue taking benadryl as instructed on package. Start prednisone taper and pepcid for 2 weeks Soothing lotion to see if it helps. Go to ED if breathing changes or if you have facial swelling.      Drug Allergy A drug allergy is when your body reacts in a bad way to a medicine. The reaction may be mild or very bad. In some cases, it can be life-threatening. If you have an allergic reaction, get help right away. You should get help even if the reaction seems mild. What are the causes? This condition is caused by a reaction in your body's defense system (immune system). The system sees a medicine as being harmful when it is not. What are the signs or symptoms? Symptoms of a mild reaction  A stuffy nose (nasal congestion).  Tingling in your mouth.  An itchy, red rash. Symptoms of a very bad reaction  Swelling of your eyes, lips, face, or tongue.  Swelling of the back of your mouth and your throat.  Breathing loudly (wheezing).  A hoarse voice.  Itchy, red, swollen areas of skin (hives).  Feeling dizzy or light-headed.  Passing out (fainting).  Feeling worried or nervous (anxiety).  Feeling mixed up (confused).  Pain in your belly (abdomen).  Trouble with breathing, talking, or swallowing.  A tight feeling in your chest.  Fast or uneven heartbeats (palpitations).  Throwing up (vomiting).  Watery poop (diarrhea). How is this treated? There is no cure for allergies. An allergic reaction can be treated with:  Medicines to help your symptoms.  Medicines that you breathe into your lungs (respiratory inhalers).  An allergy shot (epinephrine injection). For a very bad reaction, you may need to stay in the hospital. Your doctor may teach you how to use an allergy kit (anaphylaxis kit) and how to give yourself an allergy shot. You can give yourself an allergy shot with what is called an auto-injector "pen." Follow these instructions at home: If you have a very bad  allergy:  Always keep an auto-injector pen or your allergy kit with you. These could save your life. Use them as told by your doctor.  Make sure that you, the people who live with you, and your employer know: ? How to use your allergy kit. ? How to use an auto-injector pen to give you an allergy shot.  If you used your auto-injector pen: ? Get more medicine for it right away. This is important in case you have another reaction. ? Get help right away.  Wear a medical alert bracelet or necklace that says you have an allergy, if your doctor tells you to do this.   General instructions  Avoid medicines that you are allergic to.  Take over-the-counter and prescription medicines only as told by your doctor.  Do not drive until your doctor says it is safe.  If you have itchy, red, swollen areas of skin or a rash: ? Use an over-the-counter medicine (antihistamine) as told by your doctor. ? Put cold, wet cloths (cold compresses) on your skin. ? Take baths or showers in cool water. Avoid hot water.  If you had tests done, it is up to you to get your test results. Ask your doctor when your results will be ready.  Tell any doctors who care for you that you have a drug allergy.  Keep all follow-up visits as told by your doctor. This is important. Contact a doctor if:  You think that you are having a mild allergic  reaction.  You have symptoms that last more than 2 days after your reaction.  Your symptoms get worse.  You get new symptoms. Get help right away if:  You had to use your auto-injector pen. You must go to the emergency room, even if the medicine seems to be working.  You have symptoms of a very bad allergic reaction. These symptoms may be an emergency. Do not wait to see if the symptoms will go away. Use your auto-injector pen or allergy kit as you have been told. Get medical help right away. Call your local emergency services (911 in the [place] Do not drive yourself to the  hospital. Summary  A drug allergy is when your body reacts in a bad way to a medicine.  Take medicines only as told by your doctor.  Tell any doctors who care for you that you have a drug allergy.  Always keep an auto-injector pen or your allergy kit with you if you have a very bad allergy. This information is not intended to replace advice given to you by your health care provider. Make sure you discuss any questions you have with your health care provider. Document Revised: 01/24/2018 Document Reviewed: 01/24/2018 Elsevier Patient Education  [place].

## 2020-10-28 ENCOUNTER — Other Ambulatory Visit: Payer: Self-pay | Admitting: Family Medicine

## 2020-10-28 ENCOUNTER — Telehealth: Payer: Self-pay

## 2020-10-28 ENCOUNTER — Other Ambulatory Visit: Payer: Self-pay | Admitting: Obstetrics & Gynecology

## 2020-10-28 NOTE — Telephone Encounter (Signed)
Received PA request from [facility] in [place], [place]. Completed and sent through [email or web address]. Received another request through [employer] [facility]. Called pt to clarify which route she was attempting with the medication. No answer, left vm.

## 2020-10-29 NOTE — Telephone Encounter (Signed)
Last seen for med check up oct 2021 and has up coming appt on 5/11

## 2020-11-02 ENCOUNTER — Other Ambulatory Visit: Payer: Self-pay

## 2020-11-02 ENCOUNTER — Ambulatory Visit (AMBULATORY_SURGERY_CENTER): Payer: Self-pay | Admitting: *Deleted

## 2020-11-02 VITALS — Ht 65.0 in | Wt 209.0 lb

## 2020-11-02 DIAGNOSIS — Z800 Family history of malignant neoplasm of digestive organs: Secondary | ICD-10-CM

## 2020-11-02 MED ORDER — SUPREP BOWEL PREP KIT 17.5-3.13-1.6 GM/177ML PO SOLN: 1.0000 | Freq: Once | ORAL | 0 refills | Status: AC

## 2020-11-02 NOTE — Progress Notes (Signed)
No egg or soy allergy known to patient  No issues with past sedation with any surgeries or procedures- hard to wake  Post op  Patient denies ever being told they had issues or difficulty with intubation  No FH of Malignant Hyperthermia No diet pills per patient No home 02 use per patient  No blood thinners per patient  Pt denies issues with constipation - past hx- having urgency - happens a few times  A week - stools are loose watery - no constipation currently - pt denies blood or mucus in the stools  No A fib or A flutter  EMMI video to pt or via [place] 19 guidelines implemented in [place] today with Pt and RN  Pt is fully vaccinated  for Covid    Due to the COVID-19 pandemic we are asking patients to follow certain guidelines.  Pt aware of COVID protocols and [facility] guidelines

## 2020-11-09 ENCOUNTER — Telehealth: Payer: Self-pay | Admitting: Internal Medicine

## 2020-11-09 NOTE — Telephone Encounter (Signed)
Crystal with [facility] dermatology called requesting copy of pt's most recent labs fax at [phone], attention Crystal.

## 2020-11-10 ENCOUNTER — Telehealth: Payer: Self-pay | Admitting: *Deleted

## 2020-11-10 NOTE — Telephone Encounter (Signed)
Labs faxed as requested via epic.

## 2020-11-10 NOTE — Telephone Encounter (Signed)
Myrbetriq ER 50 mg has been approved from 11/09/20 to 11/09/21. Pt and pharmacy aware. [place]

## 2020-11-11 ENCOUNTER — Other Ambulatory Visit: Payer: Self-pay

## 2020-11-11 DIAGNOSIS — K7581 Nonalcoholic steatohepatitis (NASH): Secondary | ICD-10-CM

## 2020-11-11 NOTE — Telephone Encounter (Signed)
Pt states she had labs drawn at [facility] and her liver enzymes were elevated. She was told to stop her Plaquenil due to the elevated LFT's. She states the labs were supposed to be faxed over yesterday. Pt also states she took cefdinir and had terrible itching, this was in Feb. She now states when she is around dogs she has some itching. Derm told her the elevated LFT's could have caused her to itch. Pt wants to know if Dr. Hilarie Fredrickson has seen the labs and what he recommends. Please advise.

## 2020-11-11 NOTE — Telephone Encounter (Signed)
Labs reviewed as faxed to me 11/05/20 Ast 236 Alt 132 T bili 1.0 Alk phos 90   She has a hx of bx proven NASH though these enzymes are higher than normal for her  Let's repeat CBC, CMP, and INR next Monday  I may need to send her to [place] clinic for further eval and consideration of clinical trial JMP

## 2020-11-11 NOTE — Telephone Encounter (Signed)
Patient calling requesting a call back to discuss labs from [facility]. Best contact number [phone]

## 2020-11-11 NOTE — Telephone Encounter (Signed)
Spoke with pt and she is aware and will come for labs either before or after her procedure on Monday. Orders in epic.

## 2020-11-12 ENCOUNTER — Encounter: Payer: Self-pay | Admitting: Internal Medicine

## 2020-11-16 ENCOUNTER — Telehealth: Payer: Self-pay | Admitting: Internal Medicine

## 2020-11-16 ENCOUNTER — Telehealth: Payer: Self-pay

## 2020-11-16 ENCOUNTER — Encounter: Payer: 59 | Admitting: Internal Medicine

## 2020-11-16 NOTE — Telephone Encounter (Signed)
Patient notified and stated she just found some nausea medication she had at the house and she would see how she does thru the evening and night and call to get an appt in the morning if not feeling better-ER if worse

## 2020-11-16 NOTE — Telephone Encounter (Signed)
Ok, no charge

## 2020-11-16 NOTE — Telephone Encounter (Signed)
Patient states she started with severe nausea and diarrhea on Thursday. She has had fever, diarrhea and nausea all weekend- yesterday was having bad stomach cramps and hurting but that is some better today. Patient was able to eat some noodles.

## 2020-11-16 NOTE — Telephone Encounter (Signed)
Pt been sick since last Thursday or Friday and stomach is in pain called nurse line Sunday she was told to go to the hospital she was unable to go at this time no fever at this moment she tool some Ibuprofen. Pt does not know if Dr Nicki Reaper wants to see her or what she should do and requested a nurse return her call,  Pt call [phone]

## 2020-11-16 NOTE — Telephone Encounter (Signed)
More than likely a viral illness causing this It is difficult to rule out other things via phone call If dramatically worse today recommend urgent care or ER otherwise the following Could see her tomorrow late morning or late afternoon whichever the patient prefers Go to ER if progressive symptoms or worse Recheck if problems If needs nausea medication May utilize Zofran 8 mg ODT, 1 taken 3 times daily as needed for nausea, #12

## 2020-12-02 ENCOUNTER — Ambulatory Visit: Payer: 59 | Admitting: Family Medicine

## 2020-12-02 ENCOUNTER — Other Ambulatory Visit: Payer: Self-pay

## 2020-12-02 ENCOUNTER — Other Ambulatory Visit (INDEPENDENT_AMBULATORY_CARE_PROVIDER_SITE_OTHER): Payer: 59

## 2020-12-02 ENCOUNTER — Encounter: Payer: Self-pay | Admitting: Family Medicine

## 2020-12-02 VITALS — BP 123/87 | HR 81 | Temp 97.5°F | Ht 65.0 in | Wt 200.0 lb

## 2020-12-02 DIAGNOSIS — K7581 Nonalcoholic steatohepatitis (NASH): Secondary | ICD-10-CM | POA: Diagnosis not present

## 2020-12-02 DIAGNOSIS — R21 Rash and other nonspecific skin eruption: Secondary | ICD-10-CM | POA: Diagnosis not present

## 2020-12-02 DIAGNOSIS — E785 Hyperlipidemia, unspecified: Secondary | ICD-10-CM

## 2020-12-02 DIAGNOSIS — I10 Essential (primary) hypertension: Secondary | ICD-10-CM | POA: Diagnosis not present

## 2020-12-02 LAB — COMPREHENSIVE METABOLIC PANEL
ALT: 29 U/L (ref 0–35)
AST: 32 U/L (ref 0–37)
Albumin: 4.5 g/dL (ref 3.5–5.2)
Alkaline Phosphatase: 62 U/L (ref 39–117)
BUN: 15 mg/dL (ref 6–23)
CO2: 30 mEq/L (ref 19–32)
Calcium: 9.7 mg/dL (ref 8.4–10.5)
Chloride: 100 mEq/L (ref 96–112)
Creatinine, Ser: 0.86 mg/dL (ref 0.40–1.20)
GFR: 78.16 mL/min (ref >60.00)
Glucose, Bld: 106 mg/dL — ABNORMAL HIGH (ref 70–99)
Potassium: 4.3 mEq/L (ref 3.5–5.1)
Sodium: 138 mEq/L (ref 135–145)
Total Bilirubin: 0.6 mg/dL (ref 0.2–1.2)
Total Protein: 7.7 g/dL (ref 6.0–8.3)

## 2020-12-02 LAB — CBC WITH DIFFERENTIAL/PLATELET
Basophils Absolute: 0 10*3/uL (ref 0.0–0.1)
Basophils Relative: 0.7 % (ref 0.0–3.0)
Eosinophils Absolute: 0.1 10*3/uL (ref 0.0–0.7)
Eosinophils Relative: 2.2 % (ref 0.0–5.0)
HCT: 40.7 % (ref 36.0–46.0)
Hemoglobin: 14 g/dL (ref 12.0–15.0)
Lymphocytes Relative: 52.2 % — ABNORMAL HIGH (ref 12.0–46.0)
Lymphs Abs: 2.7 10*3/uL (ref 0.7–4.0)
MCHC: 34.4 g/dL (ref 30.0–36.0)
MCV: 95 fl (ref 78.0–100.0)
Monocytes Absolute: 0.5 10*3/uL (ref 0.1–1.0)
Monocytes Relative: 9.2 % (ref 3.0–12.0)
Neutro Abs: 1.9 10*3/uL (ref 1.4–7.7)
Neutrophils Relative %: 35.7 % — ABNORMAL LOW (ref 43.0–77.0)
Platelets: 232 10*3/uL (ref 150.0–400.0)
RBC: 4.29 Mil/uL (ref 3.87–5.11)
RDW: 12.8 % (ref 11.5–15.5)
WBC: 5.2 10*3/uL (ref 4.0–10.5)

## 2020-12-02 LAB — PROTIME-INR
INR: 1 ratio (ref 0.8–1.0)
Prothrombin Time: 10.7 s (ref 9.6–13.1)

## 2020-12-02 MED ORDER — CLONAZEPAM 1 MG PO TABS: ORAL_TABLET | ORAL | 3 refills | Status: DC

## 2020-12-02 MED ORDER — LISINOPRIL 20 MG PO TABS: 20.0000 mg | ORAL_TABLET | Freq: Every day | ORAL | 1 refills | Status: DC

## 2020-12-02 MED ORDER — METFORMIN HCL ER 500 MG PO TB24: ORAL_TABLET | ORAL | 1 refills | Status: DC

## 2020-12-02 NOTE — Progress Notes (Signed)
   Subjective:    Patient ID: [name], female    DOB: [birth year], 50 y.o.   MRN: [id number]  Hyperlipidemia This is a chronic problem. Treatments tried: crestor   Primary hypertension  Rash  NASH (nonalcoholic steatohepatitis)  Hyperlipidemia, unspecified hyperlipidemia type  Patient takes her cholesterol medicine regular basis tries to be careful with her diet Takes her blood pressure medicine regular basis Tries to minimize salt in the diet Takes her Celexa stress levels reasonable denies being depressed currently Does take her diabetes medicine states she feels like it is doing okay   Review of Systems     Objective:   Physical Exam Lungs clear heart regular pulse normal abdomen soft obese extremities no edema       Assessment & Plan:  1. Primary hypertension Blood pressure good control continue current measures  2. Rash Seeing dermatology will be seeing allergist soon to get tested to see what may be triggering this rash  3. NASH (nonalcoholic steatohepatitis) Following through with Dr. Hilarie Li may need to go to [facility] they will handle this.  Patient trying to the best can healthy eating regular physical activity  4. Hyperlipidemia, unspecified hyperlipidemia type Cholesterol issues takes her medicine no labs today  Patient will have a broad panel of labs in the fall

## 2020-12-07 ENCOUNTER — Encounter: Payer: Self-pay | Admitting: Allergy & Immunology

## 2020-12-07 ENCOUNTER — Other Ambulatory Visit: Payer: Self-pay

## 2020-12-07 ENCOUNTER — Ambulatory Visit (INDEPENDENT_AMBULATORY_CARE_PROVIDER_SITE_OTHER): Payer: 59 | Admitting: Allergy & Immunology

## 2020-12-07 VITALS — BP 140/78 | HR 97 | Temp 98.0°F | Resp 18 | Ht 65.0 in | Wt 198.2 lb

## 2020-12-07 DIAGNOSIS — R21 Rash and other nonspecific skin eruption: Secondary | ICD-10-CM

## 2020-12-07 DIAGNOSIS — J45990 Exercise induced bronchospasm: Secondary | ICD-10-CM

## 2020-12-07 MED ORDER — CLOBETASOL PROPIONATE 0.05 % EX OINT: 1.0000 "application " | TOPICAL_OINTMENT | Freq: Two times a day (BID) | CUTANEOUS | 2 refills | Status: AC

## 2020-12-07 MED ORDER — TRIAMCINOLONE ACETONIDE 0.1 % EX OINT: 1.0000 "application " | TOPICAL_OINTMENT | Freq: Two times a day (BID) | CUTANEOUS | 3 refills | Status: AC

## 2020-12-07 MED ORDER — FAMOTIDINE 20 MG PO TABS: 20.0000 mg | ORAL_TABLET | Freq: Two times a day (BID) | ORAL | 3 refills | Status: DC

## 2020-12-07 MED ORDER — LEVOCETIRIZINE DIHYDROCHLORIDE 5 MG PO TABS: 5.0000 mg | ORAL_TABLET | Freq: Two times a day (BID) | ORAL | 5 refills | Status: DC

## 2020-12-07 NOTE — Patient Instructions (Addendum)
1. Rash - We are going to get some lab work to look for random, difficult control causes of hives/rashes. - I am also getting environmental allergy testing as well.  - We will call you in 1-2 weeks with the results of the testing. - I am trying to avoid prednisone because this can interfere with patch testing.  - I am going to add on triamcinolone mixed with Eucerin twice daily. - I am also adding on clobetasol to use twice daily on your back for ONE WEEK, then stop for two weeks before the patch testing (to be schedule in THREE WEEKS to allow enough time between the patch testing and the clobetasol). - The patch testing looks for sensitizations to chemicals, fragrances, etc. - Add on Xyzal 67m but INCREASE to twice daily. - Add on Pepcid (famotidine) 29mtwice daily for now at least.  - [facility]   2. Return in about 3 weeks (around 12/28/2020), or PATCH TESTING.    Please inform usKoreaf any Emergency Department visits, hospitalizations, or changes in symptoms. Call usKoreaefore going to the ED for breathing or allergy symptoms since we might be able to fit you in for a sick visit. Feel free to contact usKoreanytime with any questions, problems, or concerns.  It was a pleasure to meet you today!  Websites that have reliable patient information: 1. American Academy of Asthma, Allergy, and Immunology: www.aaaai.org 2. Food Allergy Research and Education (FARE): foodallergy.org 3. Mothers of Asthmatics: http://www.asthmacommunitynetwork.org 4. American College of Allergy, Asthma, and Immunology: www.acaai.org   COVID-19 Vaccine Information can be found at: [email or web address] questions related to vaccine distribution or appointments, please email [email or web address] or call [phone]  We realize that you might be concerned about having an allergic reaction to the COVID19 vaccines. To help  with that concern, WE ARE OFFERING THE COVID19 VACCINES IN OUR OFFICE! Ask the front desk for dates!     "Like" usKorean Facebook and Instagram for our latest updates!      A healthy democracy works best when ALNew York Life Insurancearticipate! Make sure you are registered to vote! If you have moved or changed any of your contact information, you will need to get this updated before voting!  In some cases, you MAY be able to register to vote online: [email or web address]

## 2020-12-07 NOTE — Progress Notes (Signed)
NEW PATIENT  Date of Service/Encounter:  12/07/20  Consult requested by: Kathyrn Drown, MD   Assessment:   Exercise-induced bronchospasm - with normal spirometry   Rash - looks consistent with contact dermatitis (certainly does NOT look like granuloma   Plan/Recommendations:   1. Rash - We are going to get some lab work to look for random, difficult control causes of hives/rashes. - I am also getting environmental allergy testing as well.  - We will call you in 1-2 weeks with the results of the testing. - I am trying to avoid prednisone because this can interfere with patch testing.  - I am going to add on triamcinolone mixed with Eucerin twice daily. - I am also adding on clobetasol to use twice daily on your back for ONE WEEK, then stop for two weeks before the patch testing (to be schedule in THREE WEEKS to allow enough time between the patch testing and the clobetasol). - The patch testing looks for sensitizations to chemicals, fragrances, etc. - Add on Xyzal 66m but INCREASE to twice daily. - Add on Pepcid (famotidine) 271mtwice daily for now at least.  - [facility]   2. [facility] about 3 [facility] (around 12[facility]28/2020[facility]    This note in its entirety was forwarded to the Provider who requested this consultation.  Subjective:   Deborah IRIONs a [age].o. female presenting today for evaluation of  Chief Complaint  Patient presents with  . Allergy Testing  . Urticaria    All over the back, chest, neck, hands and face. Pain associated with the itching.   . Medication Reaction    Cefdinir made her itch.    Deborah RAGANas a history of the following: Patient Active Problem List   Diagnosis Date Noted  . Allergic drug reaction 10/09/2020  . Pruritic condition 10/09/2020  . Vitamin D deficiency 05/20/2020  . Hypertension   . Spastic bladder   . Prediabetes 01/01/2020  . Diastolic dysfunction  0593/23/5573. DDD (degenerative disc disease), lumbar 11/13/2019  . Pseudoarthrosis of cervical spine ([place]/05/2016  . Fibromyalgia 07/07/2015  . Obesity, unspecified 05/09/2013  . Bilateral renal cysts 05/09/2013  . Dyspareunia 02/25/2013  . Hyperglycemia 11/07/2012  . Other malaise and fatigue 11/07/2012  . Anxiety state 04/22/2010  . GERD 04/22/2010  . ILEUS 04/22/2010  . IRRITABLE BOWEL SYNDROME 04/22/2010  . Sleep apnea 04/22/2010  . LIVER FUNCTION TESTS, ABNORMAL, HX OF 04/22/2010  . Hyperlipidemia 12/03/2009  . Essential hypertension 12/03/2009  . NASH (nonalcoholic steatohepatitis) 12/03/2009  . CHEST PAIN UNSPECIFIED 12/03/2009    History obtained from: chart review and patient.  Deborah Lahas referred by LuKathyrn DrownMD.     [name] a [age].o. female presenting for an evaluation of a rash.  She is here today for a rash. It is covering her entire body. It started over one year ago. She is unsure what was going on when it starts. [place] [facility] [place] [facility] 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 This rash does itch at baseline. She had itching over her entire body and bruising. She has gone back to them but she never felt that they were doing much of anything. She was placed on Plaquenil, but this elevated her liver function (she has a history of NASH). They decreased with stopping the  Plaquenil. She was on this for a period of two weeks only.   She has been treated with prednisone. She has been treated with a topical ointment. She is unsure what medication this is. She says that it has not helped at all.   She did have bronchitis at the end of February. She was prescribed cefdinir and statrted itching two days after this. She started prednisone when this happens. She was taking 4 Benadryl every 4-6 hours for one week. She also starting taking levocetirizine.   She was also  started on famotidine as well as the levocetirizine. This did seem to help. She was also on prednisone at that time. She has noticed that there are some houses that cause more itching than others. She cleans houses for a living. She has noticed that dog houses cause more itching. She has reacted to her daughter's dog.  She does have a history of exercise-induced bronchospasm.  She uses albuterol rarely for this.  She has not needed prednisone and has not been to the emergency room.  Otherwise, there is no history of other atopic diseases, including asthma, food allergies, drug allergies, stinging insect allergies or contact dermatitis. There is no significant infectious history. Vaccinations are up to date.    Past Medical History: Patient Active Problem List   Diagnosis Date Noted  . Allergic drug reaction 10/09/2020  . Pruritic condition 10/09/2020  . Vitamin D deficiency 05/20/2020  . Hypertension   . Spastic bladder   . Prediabetes 01/01/2020  . Diastolic dysfunction 71/21/9758  . DDD (degenerative disc disease), lumbar 11/13/2019  . Pseudoarthrosis of cervical spine ([place]) 07/04/2016  . Fibromyalgia 07/07/2015  . Obesity, unspecified 05/09/2013  . Bilateral renal cysts 05/09/2013  . Dyspareunia 02/25/2013  . Hyperglycemia 11/07/2012  . Other malaise and fatigue 11/07/2012  . Anxiety state 04/22/2010  . GERD 04/22/2010  . ILEUS 04/22/2010  . IRRITABLE BOWEL SYNDROME 04/22/2010  . Sleep apnea 04/22/2010  . LIVER FUNCTION TESTS, ABNORMAL, HX OF 04/22/2010  . Hyperlipidemia 12/03/2009  . Essential hypertension 12/03/2009  . NASH (nonalcoholic steatohepatitis) 12/03/2009  . CHEST PAIN UNSPECIFIED 12/03/2009    Medication List:  Allergies as of 12/07/2020      Reactions   Cefdinir Itching   Dairy Aid [lactase]    Gluten Meal    Levaquin [levofloxacin In D5w] Diarrhea, Nausea And Vomiting   Other    PT STATES NO BLOOD PRODUCTS OR BLOOD TRANSFUSIONS    Topamax [topiramate]     Causes anger   Wheat Bran       Medication List       Accurate as of Dec 07, 2020  1:28 PM. If you have any questions, ask your nurse or doctor.        acyclovir 400 MG tablet Commonly known as: ZOVIRAX Take 1 tablet (400 mg total) by mouth daily.   albuterol 108 (90 Base) MCG/ACT inhaler Commonly known as: VENTOLIN HFA INHALE 2 PUFFS BY MOUTH EVERY 6 HOURS AS NEEDED FOR WHEEZE   augmented betamethasone dipropionate 0.05 % ointment Commonly known as: DIPROLENE-AF Apply 1 application topically 2 (two) times daily.   citalopram 40 MG tablet Commonly known as: CELEXA TAKE 1 TABLET BY MOUTH EVERY DAY   clobetasol ointment 0.05 % Commonly known as: TEMOVATE Apply 1 application topically 2 (two) times daily for 7 days. Started by: Valentina Shaggy, MD   clonazePAM 1 MG tablet Commonly known as: KLONOPIN 1/2 to 1 bid prn anxiety stress   COQ10  PO Take 300 mg by mouth.   cyclobenzaprine 10 MG tablet Commonly known as: FLEXERIL Take 0.5-1 tablets (5-10 mg total) by mouth 3 (three) times daily as needed for muscle spasms.   estradiol 2 MG tablet Commonly known as: ESTRACE TAKE 1 TABLET BY MOUTH EVERY DAY   famotidine 20 MG tablet Commonly known as: PEPCID Take 1 tablet (20 mg total) by mouth 2 (two) times daily.   fluticasone 50 MCG/ACT nasal spray Commonly known as: FLONASE USE 2 SPRAYS IN EACH NOSTRIL EVERY DAY   furosemide 20 MG tablet Commonly known as: LASIX TAKE ONE TABLET BY MOUTH EACH MORNING.   ibuprofen 800 MG tablet Commonly known as: ADVIL Take 800 mg by mouth every 8 (eight) hours as needed. for pain   Krill Oil 500 MG Caps Take by mouth.   levocetirizine 5 MG tablet Commonly known as: XYZAL Take 1 tablet (5 mg total) by mouth in the morning and at bedtime. Started by: Valentina Shaggy, MD   lidocaine 2 % solution Commonly known as: XYLOCAINE 5 mLs every 4 (four) hours as needed.   lisinopril 20 MG tablet Commonly known as:  ZESTRIL Take 1 tablet (20 mg total) by mouth daily.   metFORMIN 500 MG 24 hr tablet Commonly known as: GLUCOPHAGE-XR TAKE 1 TABLET BY MOUTH TWICE A DAY   Myrbetriq 50 MG Tb24 tablet Generic drug: mirabegron ER TAKE 1 TABLET BY MOUTH EVERY DAY   nitrofurantoin 100 MG capsule Commonly known as: MACRODANTIN Take 100 mg by mouth at bedtime.   rosuvastatin 10 MG tablet Commonly known as: CRESTOR TAKE 1 TABLET BY MOUTH EVERY DAY   triamcinolone ointment 0.1 % Commonly known as: KENALOG Apply 1 application topically 2 (two) times daily. Started by: Valentina Shaggy, MD   valACYclovir 1000 MG tablet Commonly known as: VALTREX TAKE 2 TABLETS BY MOUTH NOW THEN 2 TABLETS BY MOUTH 12 HOURS LATER   Vitamin D3 125 MCG (5000 UT) Caps Take 5,000 Units by mouth. Taking 2 per day   vitamin E 180 MG (400 UNITS) capsule Take by mouth.       Birth History: non-contributory  Developmental History: non-contributory  Past Surgical History: Past Surgical History:  Procedure Laterality Date  . ABDOMINAL HYSTERECTOMY  2005   complete hysterectomy both ovaries removed  . BREAST REDUCTION SURGERY  2009  . CARPAL TUNNEL RELEASE    . carpel tunnel Bilateral 02/2017, 2021  . [place] SURGERY  2007, 2009, 2014, 2017   anterior  x 3 , posterior x 2   . CESAREAN SECTION  94/03   x 2  . COLONOSCOPY    . ESOPHAGOGASTRODUODENOSCOPY    . POSTERIOR CERVICAL FUSION/FORAMINOTOMY N/A 01/07/2013   Procedure: POSTERIOR CERVICAL FUSION/FORAMINOTOMY LEVEL 2;  Surgeon: Hosie Spangle, MD;  Location: [facility];  Service: Neurosurgery;  Laterality: N/A;  Posterior Cervical Five-Six/Six-Seven Arthrodesis with Instrumentation  . POSTERIOR CERVICAL FUSION/FORAMINOTOMY N/A 07/04/2016   Procedure: CERVICAL THREE-FOUR POSTERIOR CERVICAL ARTHRODESIS;  Surgeon: Jovita Gamma, MD;  Location: [facility];  Service: Neurosurgery;  Laterality: N/A;  C3-C4 POSTERIOR CERVICAL ARTHRODESIS  . tummy tuck    .  TYMPANOSTOMY TUBE PLACEMENT Bilateral    As a baby  . UPPER GASTROINTESTINAL ENDOSCOPY       Family History: Family History  Problem Relation Age of Onset  . Cirrhosis Father        alcohol abuse  . Diabetes Father   . Hypertension Father   . Alcoholism Father   .  Hyperlipidemia Father   . Liver disease Father   . Alcohol abuse Father   . Ovarian cancer Mother   . Hypertension Mother   . Irritable bowel syndrome Mother   . Neuropathy Mother   . Hyperlipidemia Mother   . Cancer Mother   . Colon cancer Maternal Grandmother   . CAD Maternal Grandfather   . Drug abuse Brother   . Colon polyps Neg Hx   . Esophageal cancer Neg Hx   . Rectal cancer Neg Hx   . Stomach cancer Neg Hx      Social History: [name] lives at home with her husband. They live in a double wide with carpeting throughout the home. They have a heat pump for heating and central cooling. There are cats inside and outside of the home. There are no dust mite coverings on the bedding. There is no tobacco exposure at all. 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 She is exposed to chemicals and fumes in this job as well as dust. She does have a HEPA filter in the home. She does not like near an interstate or industrial area.     Review of Systems  Constitutional: Negative.  Negative for chills, fever, malaise/fatigue and weight loss.  HENT: Negative.  Negative for congestion, ear discharge and ear pain.   Eyes: Negative for pain, discharge and redness.  Respiratory: Negative for cough, sputum production, shortness of breath and wheezing.   Cardiovascular: Negative.  Negative for chest pain and palpitations.  Gastrointestinal: Negative for abdominal pain, heartburn, nausea and vomiting.  Skin: Positive for itching and rash.  Neurological: Negative for dizziness and headaches.  Endo/Heme/Allergies: Negative for environmental allergies. Does not bruise/bleed easily.       Objective:   Blood  pressure 140/78, pulse 97, temperature 98 F (36.7 C), temperature source Temporal, resp. rate 18, height 5' 5"  (1.651 m), weight 198 lb 3.2 oz (89.9 kg), SpO2 97 %. Body mass index is 32.98 kg/m.   Physical Exam:   Physical Exam Constitutional:      Appearance: She is well-developed.  HENT:     Head: Normocephalic and atraumatic.     Right Ear: Tympanic membrane, ear canal and external ear normal. No drainage, swelling or tenderness. Tympanic membrane is not injected, scarred, erythematous, retracted or bulging.     Left Ear: Tympanic membrane, ear canal and external ear normal. No drainage, swelling or tenderness. Tympanic membrane is not injected, scarred, erythematous, retracted or bulging.     Nose: No nasal deformity, septal deviation, mucosal edema or rhinorrhea.     Right Turbinates: Enlarged and swollen.     Left Turbinates: Enlarged and swollen.     Right Sinus: No maxillary sinus tenderness or frontal sinus tenderness.     Left Sinus: No maxillary sinus tenderness or frontal sinus tenderness.     Mouth/Throat:     Lips: Pink.     Mouth: Mucous membranes are moist. Mucous membranes are not pale and not dry.     Pharynx: Uvula midline.  Eyes:     General:        Right eye: No discharge.        Left eye: No discharge.     Conjunctiva/sclera: Conjunctivae normal.     Right eye: Right conjunctiva is not injected. No chemosis.    Left eye: Left conjunctiva is not injected. No chemosis.    Pupils: Pupils are equal, round, and reactive to light.  Cardiovascular:  Rate and Rhythm: Normal rate and regular rhythm.     Heart sounds: Normal heart sounds.  Pulmonary:     Effort: Pulmonary effort is normal. No tachypnea, accessory muscle usage or respiratory distress.     Breath sounds: Normal breath sounds. No wheezing, rhonchi or rales.  Chest:     Chest wall: No tenderness.  Abdominal:     Tenderness: There is no abdominal tenderness. There is no guarding or rebound.   Lymphadenopathy:     Head:     Right side of head: No submandibular, tonsillar or occipital adenopathy.     Left side of head: No submandibular, tonsillar or occipital adenopathy.     Cervical: No cervical adenopathy.  Skin:    General: Skin is warm.     Capillary Refill: Capillary refill takes less than 2 seconds.     Coloration: Skin is not pale.     Findings: No abrasion, erythema, petechiae or rash. Rash is not papular, urticarial or vesicular.     Comments: Multiple papular lesions over her entire body with erythema and confluence. There is no oozing. There are multiple excoriations present.   Neurological:     Mental Status: She is alert.  Psychiatric:        Behavior: Behavior is cooperative.          Diagnostic studies:   Spirometry: results abnormal (FEV1: 2.19, FVC: 2.68, FEV1/FVC: 82%).    Spirometry consistent with normal pattern.   Allergy Studies: labs sent instead        Salvatore Marvel, MD [facility]

## 2020-12-13 LAB — ALPHA-GAL PANEL
Allergen Lamb IgE: <0.1 kU/L
Beef IgE: <0.1 kU/L
IgE (Immunoglobulin E), Serum: 9 IU/mL (ref 6–495)
O215-IgE Alpha-Gal: <0.1 kU/L
Pork IgE: <0.1 kU/L

## 2020-12-17 LAB — CBC WITH DIFFERENTIAL/PLATELET
Basophils Absolute: 0 10*3/uL (ref 0.0–0.2)
Basos: 0 %
EOS (ABSOLUTE): 0.1 10*3/uL (ref 0.0–0.4)
Eos: 2 %
Hematocrit: 41.4 % (ref 34.0–46.6)
Hemoglobin: 13.6 g/dL (ref 11.1–15.9)
Immature Grans (Abs): 0 10*3/uL (ref 0.0–0.1)
Immature Granulocytes: 0 %
Lymphocytes Absolute: 3.1 10*3/uL (ref 0.7–3.1)
Lymphs: 45 %
MCH: 31.9 pg (ref 26.6–33.0)
MCHC: 32.9 g/dL (ref 31.5–35.7)
MCV: 97 fL (ref 79–97)
Monocytes Absolute: 0.5 10*3/uL (ref 0.1–0.9)
Monocytes: 8 %
Neutrophils Absolute: 3.1 10*3/uL (ref 1.4–7.0)
Neutrophils: 45 %
Platelets: 282 10*3/uL (ref 150–450)
RBC: 4.27 x10E6/uL (ref 3.77–5.28)
RDW: 12.2 % (ref 11.7–15.4)
WBC: 6.9 10*3/uL (ref 3.4–10.8)

## 2020-12-17 LAB — CMP14+EGFR
ALT: 34 IU/L — ABNORMAL HIGH (ref 0–32)
AST: 45 IU/L — ABNORMAL HIGH (ref 0–40)
Albumin/Globulin Ratio: 1.8 (ref 1.2–2.2)
Albumin: 5 g/dL — ABNORMAL HIGH (ref 3.8–4.9)
Alkaline Phosphatase: 75 IU/L (ref 44–121)
BUN/Creatinine Ratio: 20 (ref 9–23)
BUN: 19 mg/dL (ref 6–24)
Bilirubin Total: 0.8 mg/dL (ref 0.0–1.2)
CO2: 25 mmol/L (ref 20–29)
Calcium: 9.7 mg/dL (ref 8.7–10.2)
Chloride: 98 mmol/L (ref 96–106)
Creatinine, Ser: 0.94 mg/dL (ref 0.57–1.00)
Globulin, Total: 2.8 g/dL (ref 1.5–4.5)
Glucose: 97 mg/dL (ref 65–99)
Potassium: 4.6 mmol/L (ref 3.5–5.2)
Sodium: 137 mmol/L (ref 134–144)
Total Protein: 7.8 g/dL (ref 6.0–8.5)
eGFR: 73 mL/min/{1.73_m2} (ref >59)

## 2020-12-17 LAB — C-REACTIVE PROTEIN: CRP: 4 mg/L (ref 0–10)

## 2020-12-17 LAB — ALLERGENS W/COMP RFLX AREA 2
Alternaria Alternata IgE: <0.1 kU/L
Aspergillus Fumigatus IgE: <0.1 kU/L
Bermuda Grass IgE: <0.1 kU/L
Cedar, Mountain IgE: <0.1 kU/L
Cladosporium Herbarum IgE: <0.1 kU/L
Cockroach, German IgE: <0.1 kU/L
Common Silver Birch IgE: <0.1 kU/L
Cottonwood IgE: <0.1 kU/L
D Farinae IgE: <0.1 kU/L
D Pteronyssinus IgE: <0.1 kU/L
E001-IgE Cat Dander: <0.1 kU/L
E005-IgE Dog Dander: <0.1 kU/L
Elm, American IgE: <0.1 kU/L
IgE (Immunoglobulin E), Serum: 9 IU/mL (ref 6–495)
Johnson Grass IgE: <0.1 kU/L
Maple/Box Elder IgE: <0.1 kU/L
Mouse Urine IgE: <0.1 kU/L
Oak, White IgE: <0.1 kU/L
Pecan, Hickory IgE: <0.1 kU/L
Penicillium Chrysogen IgE: <0.1 kU/L
Pigweed, Rough IgE: <0.1 kU/L
Ragweed, Short IgE: <0.1 kU/L
Sheep Sorrel IgE Qn: <0.1 kU/L
Timothy Grass IgE: <0.1 kU/L
White Mulberry IgE: <0.1 kU/L

## 2020-12-17 LAB — SEDIMENTATION RATE: Sed Rate: 20 mm/hr (ref 0–40)

## 2020-12-17 LAB — CHRONIC URTICARIA: cu index: <2 (ref <10)

## 2020-12-17 LAB — TRYPTASE: Tryptase: 5 ug/L (ref 2.2–13.2)

## 2020-12-17 LAB — ANTINUCLEAR ANTIBODIES, IFA: ANA Titer 1: NEGATIVE

## 2020-12-27 ENCOUNTER — Encounter: Payer: Self-pay | Admitting: Allergy & Immunology

## 2020-12-27 ENCOUNTER — Encounter: Payer: Self-pay | Admitting: Family Medicine

## 2020-12-28 NOTE — Telephone Encounter (Signed)
Nurses Please clarify with patient If it is for 6 sleep apnea or other similar condition please write out the prescription for the reason the patient stated have Dr. Lovena Le signed the prescription then forward it to the patient thank you  If for some reason the patient once it through myself.  We will have to wait till Monday because I am gone the rest of this week thanks-Dr. Nicki Reaper

## 2020-12-29 ENCOUNTER — Other Ambulatory Visit: Payer: Self-pay

## 2020-12-29 MED ORDER — DOXYCYCLINE MONOHYDRATE 100 MG PO TABS: ORAL_TABLET | ORAL | 3 refills | Status: DC

## 2020-12-29 NOTE — Telephone Encounter (Signed)
Doxycyline sent in to pharmacy. And patient notify.

## 2021-01-04 ENCOUNTER — Ambulatory Visit: Payer: 59 | Admitting: Allergy & Immunology

## 2021-01-05 ENCOUNTER — Other Ambulatory Visit: Payer: Self-pay | Admitting: Family Medicine

## 2021-01-06 ENCOUNTER — Other Ambulatory Visit: Payer: Self-pay | Admitting: Allergy & Immunology

## 2021-01-06 ENCOUNTER — Other Ambulatory Visit: Payer: Self-pay | Admitting: Family Medicine

## 2021-01-08 ENCOUNTER — Other Ambulatory Visit: Payer: Self-pay | Admitting: Allergy & Immunology

## 2021-01-11 NOTE — Telephone Encounter (Signed)
Called patient to verify pharmacy. She verbalized understanding that a 30 day courtesy refill has been sent in and that she must keep her 01/18/21 appointment for further refills.

## 2021-01-18 ENCOUNTER — Ambulatory Visit: Payer: 59 | Admitting: Allergy & Immunology

## 2021-01-18 ENCOUNTER — Encounter: Payer: Self-pay | Admitting: Allergy & Immunology

## 2021-01-18 ENCOUNTER — Other Ambulatory Visit: Payer: Self-pay

## 2021-01-18 VITALS — BP 118/62 | HR 87 | Temp 98.0°F | Resp 16

## 2021-01-18 DIAGNOSIS — R21 Rash and other nonspecific skin eruption: Secondary | ICD-10-CM

## 2021-01-18 MED ORDER — TACROLIMUS 0.03 % EX OINT: TOPICAL_OINTMENT | Freq: Two times a day (BID) | CUTANEOUS | 4 refills | Status: DC

## 2021-01-18 NOTE — Progress Notes (Signed)
FOLLOW UP  Date of Service/Encounter:  01/18/21   Assessment:   Exercise-induced bronchospasm - with normal spirometry    Rash - looks consistent with contact dermatitis with some areas of eczema (certainly does NOT look like granuloma annulare)   [name] presents for follow-up visit.  All of her topical steroids have not seemed to have made much of a difference regarding her rash.  We compared pictures to her previous rash from her initial visit and there is absolutely no change.  In the addition of the doxycycline did nothing to help.  I am going to get the outside medical records.  We do not seem to have received them last time.  I want to see how her rash has evolved over time based on their notes.  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  She has not tried any calcineurin inhibitors or topical Jak inhibitors.  We will add on Protopic today to see how this goes.  She is going to give [place] an update in 1 to 2 weeks.  Information on Dupixent provided and consent signed so that we can get the process started.  In addition, we are going to get patch testing done to make sure this is no exogenous exposure that we can control to improve her symptoms.  Plan/Recommendations:    1. Rash - Continue with triamcinolone mixed with Eucerin twice daily. - We are adding on Protopic twice daily for your hands in particular (this is one of the ointments you have to fail for Dupixent approval). - We will schedule you for patch testing on the week of July 18th.  - Stop the doxycycline since this did not add much of anything.  - Continue with Xyzal 52m twice daily. - Continue with Pepcid (famotidine) 237mtwice daily - [facility]  - I am still not convinced that this is granuloma annulare. - Dupixent consent signed today.  2. Return in about 3 weeks (around 02/08/2021) for [facility]     Subjective:   Deborah FISCHMANs a [age].o. female presenting today for follow up of  Chief Complaint  Patient presents with   Allergies    Deborah CABRIALESas a history of the following: Patient Active Problem List   Diagnosis Date Noted   Allergic drug reaction 10/09/2020   Pruritic condition 10/09/2020   Vitamin D deficiency 05/20/2020   Hypertension    Spastic bladder    Prediabetes 0697/08/6376 Diastolic dysfunction 0558/85/0277 DDD (degenerative disc disease), lumbar 11/13/2019   Pseudoarthrosis of cervical spine ([facility]/2016   Fibromyalgia 07/07/2015   Obesity, unspecified 05/09/2013   Bilateral renal cysts 05/09/2013   Dyspareunia 02/25/2013   Hyperglycemia 11/07/2012   Other malaise and fatigue 11/07/2012   Anxiety state 04/22/2010   GERD 04/22/2010   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  ThOaklyns a [age].o. female presenting for a follow up visit. She was last seen in May 2022.  At that time, we evaluated her for a fairly serious rash.  We obtained quite a bit of lab work which was largely  normal.  We added on triamcinolone mixed with Eucerin twice daily to the entire body.  We avoided prednisone because we are planning on patch testing.  We did add on clobetasol to use twice daily on her back for 1 week before stopping for 2 weeks for the patch testing.  We added on Xyzal twice daily and Pepcid twice daily.  We also wanted to get records from [facility].  In the interim, we also added on doxycycline per my conversation with one of my partners, Dr. Neldon Mc.  Labs were notable only for elevated liver function tests, which were known to her due to  her history of nonalcoholic steatohepatitis (NASH).  Since last visit, she has done fairly well.  She is rather optimistic about her outlook, but from my evaluation and does not seem that her rash is changed at all.  This is confirmed when we look at the pictures from the last visit.  She remains on the doxycycline which she does not think helped at all.  Her dog still cause itching, but overall it is better with the Xyzal on board.  She did use the clobetasol for 1 week.  She remains on the triamcinolone mixed with Eucerin twice daily.  Her quality of life is essentially unchanged.  She has not had the patch testing done yet.  It is unclear why that was not scheduled on the way out, but I would conjecture that this was just confusion on the part of our front desk.  Unfortunately, we still have not gotten the outside records from her dermatologist.  She is willing to sign another release of information form so we can get those.  Otherwise, there have been no changes to her past medical history, surgical history, family history, or social history.    Review of Systems  Constitutional: Negative.  Negative for chills, fever, malaise/fatigue and weight loss.  HENT: Negative.  Negative for congestion, ear discharge and ear pain.   Eyes:  Negative for pain, discharge and redness.  Respiratory:  Negative for cough, sputum production, shortness of breath and wheezing.   Cardiovascular: Negative.  Negative for chest pain and palpitations.  Gastrointestinal:  Negative for abdominal pain, constipation, diarrhea, heartburn, nausea and vomiting.  Skin:  Positive for itching and rash.  Neurological:  Negative for dizziness and headaches.  Endo/Heme/Allergies:  Negative for environmental allergies. Does not bruise/bleed easily.      Objective:   Blood pressure 118/62, pulse 87, temperature 98 F (36.7 C), temperature source Temporal, resp. rate 16, SpO2 98 %. There is no height or weight on file to  calculate BMI.   Physical Exam:  Physical Exam Constitutional:      Appearance: She is well-developed.  HENT:     Head: Normocephalic and atraumatic.     Right Ear: Tympanic membrane, ear canal and external ear normal.     Left Ear: Tympanic membrane, ear canal and external ear normal.     Nose: No nasal deformity, septal deviation, mucosal edema or rhinorrhea.     Right Turbinates: Not enlarged or swollen.     Left Turbinates: Not enlarged or swollen.     Right Sinus: No maxillary sinus tenderness or frontal sinus tenderness.     Left Sinus: No maxillary sinus tenderness or frontal sinus tenderness.     Mouth/Throat:     Mouth: Mucous membranes are not pale and not dry.     Pharynx: Uvula midline.  Eyes:     General: Lids are  normal. No allergic shiner.       Right eye: No discharge.        Left eye: No discharge.     Conjunctiva/sclera: Conjunctivae normal.     Right eye: Right conjunctiva is not injected. No chemosis.    Left eye: Left conjunctiva is not injected. No chemosis.    Pupils: Pupils are equal, round, and reactive to light.  Cardiovascular:     Rate and Rhythm: Normal rate and regular rhythm.     Heart sounds: Normal heart sounds.  Pulmonary:     Effort: Pulmonary effort is normal. No tachypnea, accessory muscle usage or respiratory distress.     Breath sounds: Normal breath sounds. No wheezing, rhonchi or rales.  Chest:     Chest wall: No tenderness.  Lymphadenopathy:     Cervical: No cervical adenopathy.  Skin:    General: Skin is warm.     Capillary Refill: Capillary refill takes less than 2 seconds.     Coloration: Skin is not pale.     Findings: No abrasion, erythema, petechiae or rash. Rash is not papular, urticarial or vesicular.     Comments: Continued erythematous plaques over her arms and hands.  No honey crusting or oozing.  There are a lot of excoriations over her arms.  Her back is erythematous and confluent, but flat.  Neurological:     Mental  Status: She is alert.  Psychiatric:        Behavior: Behavior is cooperative.     Diagnostic studies: none        Salvatore Marvel, MD  [facility] [place] [facility] [place]

## 2021-01-18 NOTE — Patient Instructions (Addendum)
1. Rash - Continue with triamcinolone mixed with Eucerin twice daily. - We are adding on Protopic twice daily for your hands in particular (this is one of the ointments you have to fail for Dupixent approval). - We will schedule you for patch testing on the week of July 18th.  - Stop the doxycycline since this did not add much of anything.  - Continue with Xyzal 49m twice daily. - Continue with Pepcid (famotidine) 294mtwice daily - [facility]  - I am still not convinced that this is granuloma annulare. - Dupixent consent signed today.  2. Return in about 3 weeks (around 02/08/2021) [place]   Please inform usKoreaf any Emergency Department visits, hospitalizations, or changes in symptoms. Call usKoreaefore going to the ED for breathing or allergy symptoms since we might be able to fit you in for a sick visit. Feel free to contact usKoreanytime with any questions, problems, or concerns.  It was a pleasure to see you again today!  Websites that have reliable patient information: 1. American Academy of Asthma, Allergy, and Immunology: www.aaaai.org 2. Food Allergy Research and Education (FARE): foodallergy.org 3. Mothers of Asthmatics: http://www.asthmacommunitynetwork.org 4. American College of Allergy, Asthma, and Immunology: www.acaai.org   COVID-19 Vaccine Information can be found at: [email or web address] questions related to vaccine distribution or appointments, please email [email or web address] or call [phone]  We realize that you might be concerned about having an allergic reaction to the COVID19 vaccines. To help with that concern, WE ARE OFFERING THE COVID19 VACCINES IN OUR OFFICE! Ask the front desk for dates!     "Like" usKorean Facebook and Instagram for our latest updates!      A healthy democracy works best when ALNew York Life Insurancearticipate! Make sure you are  registered to vote! If you have moved or changed any of your contact information, you will need to get this updated before voting!  In some cases, you MAY be able to register to vote online: [email or web address]

## 2021-01-19 ENCOUNTER — Encounter: Payer: Self-pay | Admitting: Allergy & Immunology

## 2021-02-03 ENCOUNTER — Encounter: Payer: 59 | Admitting: Internal Medicine

## 2021-02-08 ENCOUNTER — Ambulatory Visit (INDEPENDENT_AMBULATORY_CARE_PROVIDER_SITE_OTHER): Payer: 59 | Admitting: Family Medicine

## 2021-02-08 ENCOUNTER — Other Ambulatory Visit: Payer: Self-pay

## 2021-02-08 ENCOUNTER — Encounter: Payer: Self-pay | Admitting: Family Medicine

## 2021-02-08 DIAGNOSIS — L235 Allergic contact dermatitis due to other chemical products: Secondary | ICD-10-CM | POA: Diagnosis not present

## 2021-02-08 NOTE — Patient Instructions (Signed)
Diagnostics: True Test patches placed.    Plan:   Allergic contact dermatitis - Instructions provided on care of the patches for the next 48 hours. - [name] was instructed to avoid showering for the next 48 hours. - [name] will follow up in 48 hours and 96 hours for patch readings.    Call the clinic if this treatment plan is not working well for you  Follow up in 2 days or sooner if needed.

## 2021-02-08 NOTE — Progress Notes (Signed)
Follow-up Note  RE: [name] MRN: [id number] DOB: 10-12-1970 Date of Office Visit: 02/08/2021  Primary care provider: Kathyrn Drown, MD Referring provider: Kathyrn Drown, MD   [name] returns to the office today for the patch test placement, given suspected history of contact dermatitis.  She reports that she continues to use Protopic with no improvement to her atopic dermatitis especially on her hands.  She is looking forward to possibly beginning [place].   Diagnostics: True Test patches placed.    Plan:   Allergic contact dermatitis - Instructions provided on care of the patches for the next 48 hours. - Jaysa was instructed to avoid showering for the next 48 hours. - Greg will follow up in 48 hours and 96 hours for patch readings.    Call the clinic if this treatment plan is not working well for you  Follow up in 2 days or sooner if needed.  Thank you for the opportunity to care for this patient.  Please do not hesitate to contact me with questions.  Gareth Morgan, FNP [facility]

## 2021-02-09 ENCOUNTER — Encounter: Payer: Self-pay | Admitting: Family Medicine

## 2021-02-09 ENCOUNTER — Encounter: Payer: 59 | Admitting: Family Medicine

## 2021-02-09 DIAGNOSIS — L235 Allergic contact dermatitis due to other chemical products: Secondary | ICD-10-CM

## 2021-02-10 ENCOUNTER — Other Ambulatory Visit: Payer: Self-pay

## 2021-02-10 ENCOUNTER — Ambulatory Visit: Payer: 59 | Admitting: Allergy

## 2021-02-10 DIAGNOSIS — L235 Allergic contact dermatitis due to other chemical products: Secondary | ICD-10-CM

## 2021-02-11 ENCOUNTER — Encounter: Payer: Self-pay | Admitting: Allergy

## 2021-02-11 ENCOUNTER — Encounter: Payer: Self-pay | Admitting: Allergy & Immunology

## 2021-02-11 ENCOUNTER — Ambulatory Visit: Payer: 59 | Admitting: Allergy & Immunology

## 2021-02-11 DIAGNOSIS — L235 Allergic contact dermatitis due to other chemical products: Secondary | ICD-10-CM

## 2021-02-11 NOTE — Progress Notes (Signed)
TRUE reading done by Dr. Shaune Leeks in office on 02/10/21.  Results entered as negative for all 3 panels.

## 2021-02-11 NOTE — Progress Notes (Signed)
    Follow-up Note  RE: KADI HESSION MRN: [id number] DOB: 04/14/1971 Date of Office Visit: 02/11/2021  Primary care provider: Kathyrn Drown, MD Referring provider: Kathyrn Drown, MD   Yezenia returns to the office today for the initial patch test interpretation, given suspected history of contact dermatitis.    Diagnostics:   TRUE TEST 48-hour reading:  +/- reaction to #5 (Caine mix)   Plan:   Allergic contact dermatitis - The patient has been provided detailed information regarding the substances she is sensitive to, as well as products containing the substances.   - Meticulous avoidance of these substances is recommended.  - If avoidance is not possible, the use of barrier creams or lotions is recommended. - If symptoms persist or progress despite meticulous avoidance of the above chemicals, Dermatology Referral may be warranted. - We are going to see her in 24 hours. - She will be starting Dupixent in the near future, possible tomorrow.    Salvatore Marvel, MD  [facility] [place] [facility] [place]

## 2021-02-12 ENCOUNTER — Encounter: Payer: 59 | Admitting: Family Medicine

## 2021-02-15 ENCOUNTER — Ambulatory Visit: Payer: 59 | Admitting: Allergy

## 2021-02-15 DIAGNOSIS — J309 Allergic rhinitis, unspecified: Secondary | ICD-10-CM

## 2021-02-24 ENCOUNTER — Other Ambulatory Visit: Payer: Self-pay | Admitting: Family Medicine

## 2021-03-25 ENCOUNTER — Other Ambulatory Visit: Payer: Self-pay

## 2021-03-25 ENCOUNTER — Telehealth: Payer: Self-pay

## 2021-03-25 DIAGNOSIS — Z1211 Encounter for screening for malignant neoplasm of colon: Secondary | ICD-10-CM

## 2021-03-25 NOTE — Telephone Encounter (Signed)
Good morning:  In working up the chart for PV noted this for last anesthesia event.  Please advise-thank you   Procedure Name: Intubation Date/Time: 07/04/2016 7:47 AM Performed by: Suzy Bouchard Pre-anesthesia Checklist: Patient identified, Emergency Drugs available, Suction available, Timeout performed and Patient being monitored Patient Re-evaluated:Patient Re-evaluated prior to inductionOxygen Delivery Method: Circle system utilized Preoxygenation: Pre-oxygenation with 100% oxygen Intubation Type: IV induction Ventilation: Mask ventilation without difficulty and Oral airway inserted - appropriate to patient size Laryngoscope Size: Glidescope and 3 Grade View: Grade I Tube type: Oral Laser Tube: Cuffed inflated with minimal occlusive pressure - saline Tube size: 7.0 mm Number of attempts: 1 Airway Equipment and Method: Stylet and Video-laryngoscopy Placement Confirmation: ETT inserted through vocal cords under direct vision,  positive ETCO2 and breath sounds checked- equal and bilateral Secured at: 23 cm Tube secured with: Tape Dental Injury: Teeth and Oropharynx as per pre-operative assessment  Difficulty Due To: Difficult Airway- due to reduced neck mobility

## 2021-03-25 NOTE — Telephone Encounter (Signed)
Appt scheduled at  [facility] for Colonoscopy November 15 @ 8:30  Case Number [id number]  Please contact pt with Colonoscopy instructions. Thanks

## 2021-03-25 NOTE — Telephone Encounter (Signed)
[name]  Per Jenny Reichmann -this pt is a difficult airway and will need to be scheduled at [facility].  Thank you

## 2021-03-26 NOTE — Telephone Encounter (Signed)
Called patient , no answer, left message for her to call us back -need to notify her of appointment change to hospital and PV changed closer to the colon date.

## 2021-03-29 ENCOUNTER — Other Ambulatory Visit: Payer: Self-pay | Admitting: Family Medicine

## 2021-03-30 ENCOUNTER — Encounter: Payer: Self-pay | Admitting: *Deleted

## 2021-03-30 NOTE — Telephone Encounter (Signed)
Called patient, no answer. Left a message given her the new appointment date/time for PV and colonoscopy at [facility].

## 2021-04-04 ENCOUNTER — Encounter: Payer: Self-pay | Admitting: Family Medicine

## 2021-04-05 ENCOUNTER — Other Ambulatory Visit: Payer: Self-pay | Admitting: Family Medicine

## 2021-04-05 MED ORDER — HYDROCODONE-ACETAMINOPHEN 5-325 MG PO TABS: 1.0000 | ORAL_TABLET | Freq: Four times a day (QID) | ORAL | 0 refills | Status: AC | PRN

## 2021-04-05 MED ORDER — HYDROCODONE-ACETAMINOPHEN 5-325 MG PO TABS: 1.0000 | ORAL_TABLET | Freq: Four times a day (QID) | ORAL | 0 refills | Status: DC | PRN

## 2021-04-05 NOTE — Telephone Encounter (Signed)
Nurses  Please connect with patient  Darvocet is no longer available We can prescribe hydrocodone for short-term use, home use only, can cause drowsiness, for some people can cause nausea or vomiting Please check with patient to see if this is agreeable to her  Second lean very important for the patient to try to keep this area clean. If any signs of infection should be seen right away.  Also clarify where will she be getting the stitches removed.  Urgent care?  Or here?  Thanks-Dr. Nicki Reaper

## 2021-04-05 NOTE — Telephone Encounter (Signed)
Pain medication was sent in It would be best to clean this with soap and water rinse carefully pat dry Do not use peroxide Please help set her up for a follow-up visit here so that sutures are removed at 14 days if possible thank you

## 2021-04-10 ENCOUNTER — Other Ambulatory Visit: Payer: Self-pay | Admitting: Allergy & Immunology

## 2021-04-14 ENCOUNTER — Ambulatory Visit: Payer: 59 | Admitting: Family Medicine

## 2021-04-14 ENCOUNTER — Other Ambulatory Visit: Payer: Self-pay | Admitting: Family Medicine

## 2021-04-14 ENCOUNTER — Other Ambulatory Visit: Payer: Self-pay

## 2021-04-14 ENCOUNTER — Encounter: Payer: Self-pay | Admitting: Family Medicine

## 2021-04-14 VITALS — BP 114/83 | HR 97 | Temp 97.2°F | Wt 202.0 lb

## 2021-04-14 DIAGNOSIS — S81802D Unspecified open wound, left lower leg, subsequent encounter: Secondary | ICD-10-CM

## 2021-04-14 MED ORDER — AZITHROMYCIN 250 MG PO TABS: ORAL_TABLET | ORAL | 0 refills | Status: AC

## 2021-04-14 NOTE — Progress Notes (Signed)
   Subjective:    Patient ID: [name], female    DOB: 09-19-1970, 50 y.o.   MRN: [id number]  HPI Pt here for suture removal on right heel. Pt slammed door on right foot 2 weeks ago (03/31/21). Pt has used triple antibiotic ointment daily, keeping foot elevated and changing dressing daily.    Review of Systems     Objective:   Physical Exam Flap laceration on the right inner aspect of the heel, thin skin, does not appear to be overtly infected but does have an area that does not seem to be healing well with some whitish skin although this is a very small area near the but the flap.  Approximately 3 mm x 4 mm no fluctuance but it does have a slight discomfort liquid at the edge Sutures were removed without difficulty the skin stayed together the skin is bruised.   Time spent with suture removal plus also discussion of care of the wound plus prescribing medicine what to watch for    Assessment & Plan:  Because this is a flap laceration there is a possibility that the distal part of the skin will not take the patient is to send [place] an update next week with pictures via MyChart Also azithromycin for 5 days warning signs discussed try to keep dry notify us if any problems

## 2021-04-21 ENCOUNTER — Encounter: Payer: Self-pay | Admitting: Family Medicine

## 2021-04-22 ENCOUNTER — Encounter: Payer: Self-pay | Admitting: Family Medicine

## 2021-04-22 MED ORDER — DOXYCYCLINE HYCLATE 100 MG PO TABS: ORAL_TABLET | ORAL | 0 refills | Status: DC

## 2021-04-22 NOTE — Addendum Note (Signed)
Addended by: Vicente Males on: 04/22/2021 04:14 PM   Modules accepted: Orders

## 2021-04-22 NOTE — Telephone Encounter (Signed)
Nurses  I would recommend antibiotics doxycycline twice daily for the next 7 days 100 mg take with a small snack and a tall glass of water  Also it would be fine for her to send me an update as she stated  Thanks-Dr. Nicki Reaper

## 2021-04-22 NOTE — Telephone Encounter (Signed)
Nurses  Please connect with [name]  I was able to see the pictures.  It is hard for me to know all the details just by looking at a picture.  Please answer the following. The whitish area that I am seeing on the picture is that draining pus?  That area of redness around the laceration is that tender or painful?  Is she placing anything on this or putting any medicines on this currently?

## 2021-04-26 ENCOUNTER — Encounter: Payer: 59 | Admitting: Internal Medicine

## 2021-04-27 LAB — HM MAMMOGRAPHY: HM Mammogram: NORMAL (ref 0–4)

## 2021-04-28 ENCOUNTER — Encounter: Payer: Self-pay | Admitting: Family Medicine

## 2021-04-28 ENCOUNTER — Other Ambulatory Visit: Payer: Self-pay | Admitting: Family Medicine

## 2021-04-29 NOTE — Telephone Encounter (Signed)
For how long will it be necessary to be wearing the boot?

## 2021-05-20 ENCOUNTER — Encounter: Payer: Self-pay | Admitting: Family Medicine

## 2021-05-22 ENCOUNTER — Other Ambulatory Visit: Payer: Self-pay | Admitting: Allergy & Immunology

## 2021-05-26 ENCOUNTER — Telehealth: Payer: Self-pay | Admitting: *Deleted

## 2021-05-26 NOTE — Telephone Encounter (Signed)
I spoke with the patient. She wants to cancel the [facility] for 11/15, she needs to reschedule. Please call her. Thanks!

## 2021-05-26 NOTE — Telephone Encounter (Signed)
Patient no show /no answer PV appointment for today. I called patient x2,  no answer, left a message for the patient to call us back today before 5 pm or the PV and procedure will be cancelled.

## 2021-05-27 NOTE — Telephone Encounter (Signed)
Procedure cancelled. Pt knows she will be rescheduled when the next available hospital date is available.

## 2021-06-06 ENCOUNTER — Other Ambulatory Visit: Payer: Self-pay | Admitting: Allergy & Immunology

## 2021-06-08 ENCOUNTER — Ambulatory Visit (HOSPITAL_COMMUNITY): Admit: 2021-06-08 | Payer: 59 | Admitting: Internal Medicine

## 2021-06-08 ENCOUNTER — Encounter (HOSPITAL_COMMUNITY): Payer: Self-pay

## 2021-06-08 SURGERY — COLONOSCOPY WITH PROPOFOL
Anesthesia: Monitor Anesthesia Care

## 2021-06-09 ENCOUNTER — Other Ambulatory Visit: Payer: Self-pay | Admitting: Family Medicine

## 2021-06-24 ENCOUNTER — Other Ambulatory Visit: Payer: Self-pay | Admitting: Obstetrics & Gynecology

## 2021-06-24 ENCOUNTER — Other Ambulatory Visit: Payer: Self-pay | Admitting: Family Medicine

## 2021-06-25 ENCOUNTER — Other Ambulatory Visit: Payer: Self-pay | Admitting: Family Medicine

## 2021-07-16 NOTE — Telephone Encounter (Signed)
Left message for pt to call back regarding scheduling appt at hospital.

## 2021-07-28 ENCOUNTER — Other Ambulatory Visit: Payer: Self-pay

## 2021-07-28 NOTE — Telephone Encounter (Signed)
Called and spoke with pt. Let pt know next available procedure date at [facility] would be 10/14/21. Pt stated that this day would work for her. Pt scheduled for colonoscopy with Dr. Hilarie Fredrickson on 10/14/21 at 10:15 am. Previsit telephone appt scheduled for 09/22/21 at 8 am. Pt verbalized understanding. New case id: [id number]

## 2021-08-03 ENCOUNTER — Other Ambulatory Visit: Payer: Self-pay

## 2021-08-03 ENCOUNTER — Encounter: Payer: Self-pay | Admitting: Allergy & Immunology

## 2021-08-03 ENCOUNTER — Ambulatory Visit (INDEPENDENT_AMBULATORY_CARE_PROVIDER_SITE_OTHER): Payer: 59 | Admitting: Allergy & Immunology

## 2021-08-03 VITALS — BP 130/88 | HR 87 | Temp 98.0°F | Resp 17 | Ht 65.0 in | Wt 210.4 lb

## 2021-08-03 DIAGNOSIS — J45990 Exercise induced bronchospasm: Secondary | ICD-10-CM

## 2021-08-03 DIAGNOSIS — L209 Atopic dermatitis, unspecified: Secondary | ICD-10-CM

## 2021-08-03 MED ORDER — DUPILUMAB 300 MG/2ML ~~LOC~~ SOSY
600.0000 mg | PREFILLED_SYRINGE | Freq: Once | SUBCUTANEOUS | Status: AC
  Administered 2021-08-03: 600 mg via SUBCUTANEOUS

## 2021-08-03 NOTE — Progress Notes (Signed)
FOLLOW UP  Date of Service/Encounter:  08/03/21   Assessment:   Exercise-induced bronchospasm - with normal spirometry    Rash - looks consistent with contact dermatitis with some areas of eczema (certainly does NOT look like granuloma annulare)  Plan/Recommendations:    1. Rash - Sample of Dupixent provided today.  - Tammy will reach out to you to discuss the approval process. - Your husband can give this to you.  - Continue with Xyzal 13m twice daily. - Continue with Pepcid (famotidine) 265mtwice daily  2. Return in about 3 months (around 11/01/2021).    Subjective:   ThTKEYA STENCILs a [age].o. 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  ThShondreas a [age].o. female presenting for a follow up visit. She was last seen in July 2022.  At that time, we were trying to figure out why she was having this intense rash which was initially diagnosed by  dermatologist as granuloma annulare.  We did contact dermatitis testing with the true Test series and she was only slightly reactive to caine mix.  Unfortunately, there was an issue when they placed the patches because she got sick  With COVID and cannot come in to have the read.  I only saw her on her very last reading.  We discussed putting her on Dupixent.  Reviewed the labs that I sent initially in May 2022 showed a normal alpha gal panel, CBC with differential, serum tryptase, ESR, CRP, CMP, chronic urticaria panel, and ANA.  Environmental allergy panel was completely negative.  In the interim, she has remained on levocetirizine twice daily.  For whatever reason, she has not followed up in several months.  She has been taking levocetirizine religiously without any improvement.  She has been on multiple medications including Protopic, hydrocortisone, clobetasol, and triamcinolone with minimal improvements.  They do tend to take the edge off, but otherwise there is no improvement at all.  Otherwise, there have been no changes to her past medical history, surgical history, family history, or social history.    Review of Systems  Constitutional: Negative.  Negative for chills, fever, malaise/fatigue and weight loss.  HENT: Negative.  Negative for congestion, ear discharge and ear pain.   Eyes:  Negative for pain, discharge and redness.  Respiratory:  Negative for cough, sputum production, shortness of breath and wheezing.   Cardiovascular: Negative.  Negative for chest pain and palpitations.  Gastrointestinal:  Negative for abdominal pain,  constipation, diarrhea, heartburn, nausea and vomiting.  Skin:  Positive for itching and rash.  Neurological:  Negative for dizziness and headaches.  Endo/Heme/Allergies:  Negative for environmental allergies. Does not bruise/bleed easily.      Objective:   Blood pressure 130/88, pulse 87, temperature 98 F (36.7 C), temperature source Temporal, resp.  rate 17, height 5' 5"  (1.651 m), weight 210 lb 6.4 oz (95.4 kg), SpO2 97 %. Body mass index is 35.01 kg/m.   Physical Exam:  Physical Exam Constitutional:      Appearance: She is well-developed.  HENT:     Head: Normocephalic and atraumatic.     Right Ear: Tympanic membrane, ear canal and external ear normal.     Left Ear: Tympanic membrane, ear canal and external ear normal.     Nose: No nasal deformity, septal deviation, mucosal edema or rhinorrhea.     Right Turbinates: Not enlarged, swollen or pale.     Left Turbinates: Not enlarged, swollen or pale.     Right Sinus: No maxillary sinus tenderness or frontal sinus tenderness.     Left Sinus: No maxillary sinus tenderness or frontal sinus tenderness.     Mouth/Throat:     Mouth: Mucous membranes are not pale and not dry.     Pharynx: Uvula midline.  Eyes:     General: Lids are normal. No allergic shiner.       Right eye: No discharge.        Left eye: No discharge.     Conjunctiva/sclera: Conjunctivae normal.     Right eye: Right conjunctiva is not injected. No chemosis.    Left eye: Left conjunctiva is not injected. No chemosis.    Pupils: Pupils are equal, round, and reactive to light.  Cardiovascular:     Rate and Rhythm: Normal rate and regular rhythm.     Heart sounds: Normal heart sounds.  Pulmonary:     Effort: Pulmonary effort is normal. No tachypnea, accessory muscle usage or respiratory distress.     Breath sounds: Normal breath sounds. No wheezing, rhonchi or rales.  Chest:     Chest wall: No tenderness.  Lymphadenopathy:     Cervical: No cervical adenopathy.  Skin:    General: Skin is warm.     Capillary Refill: Capillary refill takes less than 2 seconds.     Coloration: Skin is not pale.     Findings: No abrasion, erythema, petechiae or rash. Rash is not papular, urticarial or vesicular.     Comments: Continued erythematous plaques over her arms and hands.  No honey crusting or oozing.  There are a lot of  excoriations over her arms.  Her back is erythematous and confluent, but flat.  Overall, it does not appear to change since I last saw her.  Neurological:     Mental Status: She is alert.  Psychiatric:        Behavior: Behavior is cooperative.     Diagnostic studies: none  Dupixent loading dose (600 mg) given in clinic.  She was monitored for 30 minutes and discharged in stable condition.     Salvatore Marvel, MD  [facility] [place] [facility] [place]

## 2021-08-03 NOTE — Patient Instructions (Addendum)
1. Rash - Sample of Dupixent provided today.  - Tammy will reach out to you to discuss the approval process. - Your husband can give this to you.  - Continue with Xyzal 36m twice daily. - Continue with Pepcid (famotidine) 264mtwice daily  2. Return in about 3 months (around 11/01/2021).    Please inform usKoreaf any Emergency Department visits, hospitalizations, or changes in symptoms. Call usKoreaefore going to the ED for breathing or allergy symptoms since we might be able to fit you in for a sick visit. Feel free to contact usKoreanytime with any questions, problems, or concerns.  It was a pleasure to see you again today!  Websites that have reliable patient information: 1. American Academy of Asthma, Allergy, and Immunology: www.aaaai.org 2. Food Allergy Research and Education (FARE): foodallergy.org 3. Mothers of Asthmatics: http://www.asthmacommunitynetwork.org 4. American College of Allergy, Asthma, and Immunology: www.acaai.org   COVID-19 Vaccine Information can be found at: [email or web address] questions related to vaccine distribution or appointments, please email [email or web address] or call [phone]  We realize that you might be concerned about having an allergic reaction to the COVID19 vaccines. To help with that concern, WE ARE OFFERING THE COVID19 VACCINES IN OUR OFFICE! Ask the front desk for dates!     Like usKorean Fa[place] Citynd Instagram for our latest updates!      A healthy democracy works best when ALNew York Life Insurancearticipate! Make sure you are registered to vote! If you have moved or changed any of your contact information, you will need to get this updated before voting!  In some cases, you MAY be able to register to vote online: [email or web address]

## 2021-08-03 NOTE — Progress Notes (Signed)
Immunotherapy   Patient Details  Name: [name] MRN: [id number] Date of Birth: 04/25/1971  08/03/2021  [name] received a loading sample dose of Dupixent for eczema. Patient waited in office with no problems. Frequency: Every 2 weeks Consent signed and patient instructions given.   Herbie Drape 08/03/2021, 5:05 PM

## 2021-08-05 ENCOUNTER — Encounter: Payer: Self-pay | Admitting: Allergy & Immunology

## 2021-08-09 ENCOUNTER — Telehealth: Payer: Self-pay | Admitting: *Deleted

## 2021-08-09 MED ORDER — EUCRISA 2 % EX OINT: 1.0000 "application " | TOPICAL_OINTMENT | Freq: Two times a day (BID) | CUTANEOUS | 3 refills | Status: AC

## 2021-08-09 NOTE — Telephone Encounter (Signed)
-----   Message from Valentina Shaggy, MD sent at 08/05/2021  5:14 AM EST ----- New start [place]

## 2021-08-09 NOTE — Telephone Encounter (Signed)
Patient Ins plan has denied her Dupixent due to need to try step therapy which is listed as Eucrisa, phototherapy or immunosuppressant. I called the plan to find out if patient has try one or all and was advised one (I hope). So she will need trial and failure of Eucrisa in order for this plan to approve

## 2021-08-09 NOTE — Telephone Encounter (Signed)
I [facility]ID [facility]. Can someone call her to let her know?  I would like to give her a Dupixent sample at the next visit to keep her on it while we jump through these hoops.   Salvatore Marvel, MD [facility] and [place] [facility] [place]

## 2021-08-09 NOTE — Telephone Encounter (Signed)
Called and spoke with the patient and advised. Patient verbalized understanding. She is going to pick up the [place]. She has been scheduled to receive a Dupixent sample in [facility] per her request.

## 2021-08-10 NOTE — Telephone Encounter (Signed)
Great thank you!

## 2021-08-17 ENCOUNTER — Ambulatory Visit (INDEPENDENT_AMBULATORY_CARE_PROVIDER_SITE_OTHER): Payer: 59

## 2021-08-17 ENCOUNTER — Other Ambulatory Visit: Payer: Self-pay

## 2021-08-17 DIAGNOSIS — L209 Atopic dermatitis, unspecified: Secondary | ICD-10-CM | POA: Diagnosis not present

## 2021-08-17 MED ORDER — DUPILUMAB 300 MG/2ML ~~LOC~~ SOSY
300.0000 mg | PREFILLED_SYRINGE | SUBCUTANEOUS | Status: DC
  Administered 2021-08-17 – 2021-10-12 (×5): 300 mg via SUBCUTANEOUS

## 2021-08-17 MED ORDER — DUPILUMAB 300 MG/2ML ~~LOC~~ SOSY: 300.0000 mg | PREFILLED_SYRINGE | Freq: Once | SUBCUTANEOUS | Status: DC

## 2021-08-17 NOTE — Progress Notes (Signed)
Sample of dupixent 300 mg given in Right arm. Patient to receive dupixent for atopic dermatitis every 2 weeks. Per Dr. Ernst Bowler   Lot number [id number]  Expiration date 07/25/2022

## 2021-08-31 ENCOUNTER — Telehealth: Payer: Self-pay

## 2021-08-31 ENCOUNTER — Ambulatory Visit (INDEPENDENT_AMBULATORY_CARE_PROVIDER_SITE_OTHER): Payer: 59

## 2021-08-31 ENCOUNTER — Other Ambulatory Visit: Payer: Self-pay

## 2021-08-31 DIAGNOSIS — L209 Atopic dermatitis, unspecified: Secondary | ICD-10-CM

## 2021-08-31 NOTE — Telephone Encounter (Signed)
Patient called stating her eucrisa needs a prior auth.  [facility] [place]

## 2021-09-03 NOTE — Telephone Encounter (Signed)
PA form has been filled out and is on Dr. Gillermina Hu desk in [place] to be signed and then it can be faxed.

## 2021-09-04 ENCOUNTER — Other Ambulatory Visit: Payer: Self-pay | Admitting: Family Medicine

## 2021-09-04 ENCOUNTER — Other Ambulatory Visit: Payer: Self-pay | Admitting: Allergy & Immunology

## 2021-09-08 NOTE — Telephone Encounter (Signed)
Was the form signed and faxed yesterday 09/07/2021?

## 2021-09-09 NOTE — Telephone Encounter (Signed)
I called the patient to clarify. We sent in [place] but it needs a PA. Hollie [place] said she has faxed everything that needs faxing. We are waiting now for the insurance company to put yet another road block in place.   So that step is done.   On another note, she feels that the [place] is working well. Itching has improved. Lesions are slowly improving.   If Dupixent is not approved by the time of her next injection, please give another sample. I do not want to take any steps back.   Thanks, Salvatore Marvel, MD Allergy and [place] [facility] [place]

## 2021-09-09 NOTE — Telephone Encounter (Signed)
Dr. Ernst Bowler do you recall signing a PA on Tuesday for Eucrisa for this patient?

## 2021-09-09 NOTE — Telephone Encounter (Signed)
Patient called to check on prior authorization. Patient states she needs to try the eucrisa first before her dupixent is approved.  Has the form been singed and faxed over?

## 2021-09-10 NOTE — Telephone Encounter (Signed)
Georga Hacking has been approved. I have faxed the approval to the pharmacy.

## 2021-09-14 ENCOUNTER — Ambulatory Visit: Payer: 59

## 2021-09-14 NOTE — Telephone Encounter (Signed)
Thanks much!   Salvatore Marvel, MD Allergy and [place] [facility] [place]

## 2021-09-15 ENCOUNTER — Other Ambulatory Visit: Payer: Self-pay

## 2021-09-15 ENCOUNTER — Ambulatory Visit (INDEPENDENT_AMBULATORY_CARE_PROVIDER_SITE_OTHER): Payer: 59

## 2021-09-15 DIAGNOSIS — L209 Atopic dermatitis, unspecified: Secondary | ICD-10-CM

## 2021-09-22 ENCOUNTER — Other Ambulatory Visit: Payer: Self-pay

## 2021-09-22 ENCOUNTER — Ambulatory Visit (AMBULATORY_SURGERY_CENTER): Payer: 59 | Admitting: *Deleted

## 2021-09-22 VITALS — Ht 65.0 in | Wt 210.0 lb

## 2021-09-22 DIAGNOSIS — Z800 Family history of malignant neoplasm of digestive organs: Secondary | ICD-10-CM

## 2021-09-22 NOTE — Progress Notes (Signed)
Patient's pre-visit was done today over the phone with the patient. Name,DOB and address verified. Patient denies any allergies to Eggs and Soy. Patient has h/o difficult intubation. Patient is not taking any diet pills or blood thinners. No home Oxygen.  ? ?Prep instructions sent to pt's MyChart-pt aware. Patient understands to call us back with any questions or concerns. Patient is aware of our care-partner policy and [id number] safety protocol. Patient has suprep at home. ? ?EMMI education assigned to the patient for the procedure, sent to [place].  ? ?The patient is COVID-19 vaccinated.   ?

## 2021-09-28 ENCOUNTER — Other Ambulatory Visit: Payer: Self-pay

## 2021-09-28 ENCOUNTER — Ambulatory Visit (INDEPENDENT_AMBULATORY_CARE_PROVIDER_SITE_OTHER): Payer: 59 | Admitting: *Deleted

## 2021-09-28 DIAGNOSIS — L209 Atopic dermatitis, unspecified: Secondary | ICD-10-CM

## 2021-09-29 ENCOUNTER — Ambulatory Visit: Payer: 59

## 2021-09-30 ENCOUNTER — Other Ambulatory Visit: Payer: Self-pay

## 2021-09-30 ENCOUNTER — Ambulatory Visit: Payer: 59 | Admitting: Allergy & Immunology

## 2021-09-30 ENCOUNTER — Encounter: Payer: Self-pay | Admitting: Allergy & Immunology

## 2021-09-30 VITALS — BP 122/80 | HR 92 | Temp 98.0°F | Resp 16 | Ht 65.0 in | Wt 206.6 lb

## 2021-09-30 DIAGNOSIS — L209 Atopic dermatitis, unspecified: Secondary | ICD-10-CM

## 2021-09-30 DIAGNOSIS — L235 Allergic contact dermatitis due to other chemical products: Secondary | ICD-10-CM

## 2021-09-30 DIAGNOSIS — J45990 Exercise induced bronchospasm: Secondary | ICD-10-CM

## 2021-09-30 DIAGNOSIS — R21 Rash and other nonspecific skin eruption: Secondary | ICD-10-CM

## 2021-09-30 MED ORDER — DOXEPIN HCL 10 MG PO CAPS: 10.0000 mg | ORAL_CAPSULE | Freq: Every day | ORAL | 5 refills | Status: DC

## 2021-09-30 MED ORDER — LEVOCETIRIZINE DIHYDROCHLORIDE 5 MG PO TABS: 5.0000 mg | ORAL_TABLET | Freq: Two times a day (BID) | ORAL | 5 refills | Status: DC

## 2021-09-30 MED ORDER — FAMOTIDINE 20 MG PO TABS: 20.0000 mg | ORAL_TABLET | Freq: Two times a day (BID) | ORAL | 5 refills | Status: DC

## 2021-09-30 NOTE — Progress Notes (Signed)
? ?FOLLOW UP ? ?Date of Service/Encounter:  09/30/21 ? ? ?Assessment:  ? ?Exercise-induced bronchospasm - with normal spirometry  ?  ?Rash - looks consistent with contact dermatitis with some areas of eczema, overall improving on Dupixent ? ?Plan/Recommendations:  ? ?1. Rash ?- Since Eucrisa did not work, we are going to get the [place] approved (hopefully). ?- We are going to add on doxepin at night (this causes sleepiness, so beware).  ?- Continue with Xyzal 9m twice daily. ?- Continue with Pepcid (famotidine) 234mtwice daily ? ?2. Return in about 6 months (around 04/02/2022).  ? ? ?Subjective:  ? ?Deborah DAPOLITOs a [age].o. female presenting today for follow up of  ?Chief Complaint  ?Patient presents with  ? Follow-up  ?  Eu[place] not working. Patient states that she would like to push thru to the insurance for [place] ? ? ?Deborah SAILERas a history of the following: ?Patient Active Problem List  ? Diagnosis Date Noted  ? Allergic drug reaction 10/09/2020  ? Pruritic condition 10/09/2020  ? Vitamin D deficiency 05/20/2020  ? Hypertension   ? Spastic bladder   ? Prediabetes 01/01/2020  ? Diastolic dysfunction 0554/56/2563? DDD (degenerative disc disease), lumbar 11/13/2019  ? Pseudoarthrosis of cervical spine ([place]/05/2016  ? Fibromyalgia 07/07/2015  ? Obesity, unspecified 05/09/2013  ? Bilateral renal cysts 05/09/2013  ? Dyspareunia 02/25/2013  ? Hyperglycemia 11/07/2012  ? Other malaise and fatigue 11/07/2012  ? Anxiety state 04/22/2010  ? GERD 04/22/2010  ? ILEUS 04/22/2010  ? IRRITABLE BOWEL SYNDROME 04/22/2010  ? Sleep apnea 04/22/2010  ? LIVER FUNCTION TESTS, ABNORMAL, HX OF 04/22/2010  ? Hyperlipidemia 12/03/2009  ? Essential hypertension 12/03/2009  ? NASH (nonalcoholic steatohepatitis) 12/03/2009  ? CHEST PAIN UNSPECIFIED 12/03/2009  ? ? ?History obtained from: chart review and patient. ? ?[name] a [age].o. female presenting for a follow up visit.  She was last seen in January  2023.  At that time, we gave a sample of Dupixent.  We continue with Xyzal 5 mg twice daily and Pepcid twice daily. ? ?Reviewed the labs that I sent initially in May 2022 showed a normal alpha gal panel, CBC with differential, serum tryptase, ESR, CRP, CMP, chronic urticaria panel, and ANA.  Environmental allergy panel was completely negative. ? ?Since the last visit, she has mostly done well. She is on the [place] it is not working at all. This was one of the steps that she had to fail to get Dupixent approved. This is not doing anything at all.  She is moisturizing quite a bit. She has been on multiple medications including Eucrisa, Protopic, hydrocortisone, clobetasol, and triamcinolone with minimal improvements.  They do tend to take the edge off, but otherwise there is no improvement at all.  She remains on levocetirizine twice daily which seems to help somewhat.  She is also on the Pepcid twice daily as well. ? ?Since insurance has been refusing to cover [place] have been giving her samples which have actually helped quite a bit.  Many of her lesions are smoothed out.  The one on her left hand in particular is nearly cleared up.  She does still have the rash on her upper chest and her back, but this is smoothed out as well. She certainly notes an improvement in her quality of life. ? ?Again, we have done patch testing but she contracted COVID-19 during the reading process so not sure we can  consider it successful. ? ?She is now on prednisone again for bronchitis and sinusitis. She does not get those too often, mostly in the fall months.  She estimates 1-2 times per year max.  Asthma is under good control.  She does have albuterol to use as needed, but typically her inhalers expire.  ? ?Otherwise, there have been no changes to her past medical history, surgical history, family history, or social history. ? ? ? ?Review of Systems  ?Constitutional: Negative.  Negative for chills, fever, malaise/fatigue  and weight loss.  ?HENT: Negative.  Negative for congestion, ear discharge and ear pain.   ?Eyes:  Negative for pain, discharge and redness.  ?Respiratory:  Negative for cough, sputum production, shortness of breath and wheezing.   ?Cardiovascular: Negative.  Negative for chest pain and palpitations.  ?Gastrointestinal:  Negative for abdominal pain, constipation, diarrhea, heartburn, nausea and vomiting.  ?Skin:  Positive for itching and rash.  ?Neurological:  Negative for dizziness and headaches.  ?Endo/Heme/Allergies:  Negative for environmental allergies. Does not bruise/bleed easily.   ? ? ? ?Objective:  ? ?Blood pressure 122/80, pulse 92, temperature 98 ?F (36.7 ?C), temperature source Temporal, resp. rate 16, height 5' 5"  (1.651 m), weight 206 lb 9.6 oz (93.7 kg), SpO2 96 %. ?Body mass index is 34.38 kg/m?. ? ? ? ?Physical Exam ?Constitutional:   ?   Appearance: She is well-developed.  ?HENT:  ?   Head: Normocephalic and atraumatic.  ?   Right Ear: Tympanic membrane, ear canal and external ear normal.  ?   Left Ear: Tympanic membrane, ear canal and external ear normal.  ?   Nose: No nasal deformity, septal deviation, mucosal edema or rhinorrhea.  ?   Right Turbinates: Not enlarged, swollen or pale.  ?   Left Turbinates: Not enlarged, swollen or pale.  ?   Right Sinus: No maxillary sinus tenderness or frontal sinus tenderness.  ?   Left Sinus: No maxillary sinus tenderness or frontal sinus tenderness.  ?   Mouth/Throat:  ?   Mouth: Mucous membranes are not pale and not dry.  ?   Pharynx: Uvula midline.  ?Eyes:  ?   General: Lids are normal. No allergic shiner.    ?   Right eye: No discharge.     ?   Left eye: No discharge.  ?   Conjunctiva/sclera: Conjunctivae normal.  ?   Right eye: Right conjunctiva is not injected. No chemosis. ?   Left eye: Left conjunctiva is not injected. No chemosis. ?   Pupils: Pupils are equal, round, and reactive to light.  ?Cardiovascular:  ?   Rate and Rhythm: Normal rate and  regular rhythm.  ?   Heart sounds: Normal heart sounds.  ?Pulmonary:  ?   Effort: Pulmonary effort is normal. No tachypnea, accessory muscle usage or respiratory distress.  ?   Breath sounds: Normal breath sounds. No wheezing, rhonchi or rales.  ?Chest:  ?   Chest wall: No tenderness.  ?Lymphadenopathy:  ?   Cervical: No cervical adenopathy.  ?Skin: ?   General: Skin is warm.  ?   Capillary Refill: Capillary refill takes less than 2 seconds.  ?   Coloration: Skin is not pale.  ?   Findings: No abrasion, erythema, petechiae or rash. Rash is not papular, urticarial or vesicular.  ?   Comments: Her rash actually looks a lot better.  The rash on the upper chest and back has smoothed out, although it is still present.  There are few excoriations present.  Her left hand in particular has almost normalized.  Her right hand still has some raised areas present.  ?Neurological:  ?   Mental Status: She is alert.  ?Psychiatric:     ?   Behavior: Behavior is cooperative.  ? ? ?Right hand: ? ? ? ?Left hand: ? ? ? ? ?Diagnostic studies: none ? ? ? ? ?  ?Salvatore Marvel, MD  ?[facility] ? ? ? ? ? ? ?

## 2021-09-30 NOTE — Patient Instructions (Addendum)
1. Rash ?- Since Eucrisa did not work, we are going to get the [place] approved (hopefully). ?- We are going to add on doxepin at night (this causes sleepiness, so beware).  ?- Continue with Xyzal 78m twice daily. ?- Continue with Pepcid (famotidine) 241mtwice daily ? ?2. Return in about 6 months (around 04/02/2022).  ? ? ?Please inform usKoreaf any Emergency Department visits, hospitalizations, or changes in symptoms. Call usKoreaefore going to the ED for breathing or allergy symptoms since we might be able to fit you in for a sick visit. Feel free to contact usKoreanytime with any questions, problems, or concerns. ? ?It was a pleasure to see you again today! ? ?Websites that have reliable patient information: ?1. American Academy of Asthma, Allergy, and Immunology: www.aaaai.org ?2. Food Allergy Research and Education (FARE): foodallergy.org ?3. Mothers of Asthmatics: http://www.asthmacommunitynetwork.org ?4. AmSP[employer]f Allergy, Asthma, and Immunology: wwMonthlyElectricBill.co.uk ? ?COVID-19 Vaccine Information can be found at: [email or web address] questions related to vaccine distribution or appointments, please email [email or web address] or call [phone] ? ?We realize that you might be concerned about having an allergic reaction to the COVID19 vaccines. To help with that concern, WE ARE OFFERING THE COVID19 VACCINES IN OUR OFFICE! Ask the front desk for dates!  ? ? ? ??Like? usKorean Facebook and Instagram for our latest updates!  ?  ? ? ?A healthy democracy works best when ALNew York Life Insurancearticipate! Make sure you are registered to vote! If you have moved or changed any of your contact information, you will need to get this updated before voting! ? ?In some cases, you MAY be able to register to vote online: [email or web address] ? ? ? ?

## 2021-10-01 ENCOUNTER — Encounter: Payer: Self-pay | Admitting: Allergy & Immunology

## 2021-10-06 ENCOUNTER — Encounter (HOSPITAL_COMMUNITY): Payer: Self-pay | Admitting: Internal Medicine

## 2021-10-06 NOTE — Progress Notes (Signed)
Attempted to obtain medical history via telephone, unable to reach at this time. I left a voicemail to return pre surgical testing department's phone call.  

## 2021-10-12 ENCOUNTER — Ambulatory Visit (INDEPENDENT_AMBULATORY_CARE_PROVIDER_SITE_OTHER): Payer: 59

## 2021-10-12 ENCOUNTER — Other Ambulatory Visit: Payer: Self-pay

## 2021-10-12 ENCOUNTER — Encounter: Payer: Self-pay | Admitting: Internal Medicine

## 2021-10-12 DIAGNOSIS — L209 Atopic dermatitis, unspecified: Secondary | ICD-10-CM | POA: Diagnosis not present

## 2021-10-13 ENCOUNTER — Other Ambulatory Visit: Payer: Self-pay | Admitting: Family Medicine

## 2021-10-13 NOTE — Telephone Encounter (Signed)
May have 90 days on each needs office visit ?

## 2021-10-14 ENCOUNTER — Ambulatory Visit (HOSPITAL_COMMUNITY): Payer: 59 | Admitting: Anesthesiology

## 2021-10-14 ENCOUNTER — Other Ambulatory Visit: Payer: Self-pay

## 2021-10-14 ENCOUNTER — Encounter (HOSPITAL_COMMUNITY)
Admission: RE | Disposition: A | Discharge status: Home / Self Care | Payer: Self-pay | Source: Home / Self Care | Attending: Internal Medicine

## 2021-10-14 ENCOUNTER — Encounter (HOSPITAL_COMMUNITY): Payer: Self-pay | Admitting: Internal Medicine

## 2021-10-14 ENCOUNTER — Ambulatory Visit (HOSPITAL_COMMUNITY)
Admission: RE | Admit: 2021-10-14 | Discharge: 2021-10-14 | Disposition: A | Discharge status: Home / Self Care | Payer: 59 | Attending: Internal Medicine | Admitting: Internal Medicine

## 2021-10-14 ENCOUNTER — Ambulatory Visit (HOSPITAL_BASED_OUTPATIENT_CLINIC_OR_DEPARTMENT_OTHER): Payer: 59 | Admitting: Anesthesiology

## 2021-10-14 DIAGNOSIS — D123 Benign neoplasm of transverse colon: Secondary | ICD-10-CM | POA: Diagnosis not present

## 2021-10-14 DIAGNOSIS — Z7984 Long term (current) use of oral hypoglycemic drugs: Secondary | ICD-10-CM | POA: Diagnosis not present

## 2021-10-14 DIAGNOSIS — Z1211 Encounter for screening for malignant neoplasm of colon: Secondary | ICD-10-CM | POA: Insufficient documentation

## 2021-10-14 DIAGNOSIS — K219 Gastro-esophageal reflux disease without esophagitis: Secondary | ICD-10-CM | POA: Insufficient documentation

## 2021-10-14 DIAGNOSIS — K635 Polyp of colon: Secondary | ICD-10-CM

## 2021-10-14 DIAGNOSIS — G4730 Sleep apnea, unspecified: Secondary | ICD-10-CM | POA: Diagnosis not present

## 2021-10-14 DIAGNOSIS — Z79899 Other long term (current) drug therapy: Secondary | ICD-10-CM | POA: Diagnosis not present

## 2021-10-14 DIAGNOSIS — D122 Benign neoplasm of ascending colon: Secondary | ICD-10-CM | POA: Diagnosis not present

## 2021-10-14 DIAGNOSIS — I10 Essential (primary) hypertension: Secondary | ICD-10-CM | POA: Insufficient documentation

## 2021-10-14 DIAGNOSIS — E119 Type 2 diabetes mellitus without complications: Secondary | ICD-10-CM | POA: Insufficient documentation

## 2021-10-14 DIAGNOSIS — J45909 Unspecified asthma, uncomplicated: Secondary | ICD-10-CM | POA: Diagnosis not present

## 2021-10-14 DIAGNOSIS — Z800 Family history of malignant neoplasm of digestive organs: Secondary | ICD-10-CM

## 2021-10-14 DIAGNOSIS — K580 Irritable bowel syndrome with diarrhea: Secondary | ICD-10-CM | POA: Diagnosis not present

## 2021-10-14 DIAGNOSIS — M797 Fibromyalgia: Secondary | ICD-10-CM | POA: Diagnosis not present

## 2021-10-14 DIAGNOSIS — K529 Noninfective gastroenteritis and colitis, unspecified: Secondary | ICD-10-CM

## 2021-10-14 DIAGNOSIS — D125 Benign neoplasm of sigmoid colon: Secondary | ICD-10-CM

## 2021-10-14 DIAGNOSIS — F419 Anxiety disorder, unspecified: Secondary | ICD-10-CM | POA: Diagnosis not present

## 2021-10-14 HISTORY — PX: COLONOSCOPY WITH PROPOFOL: SHX5780

## 2021-10-14 HISTORY — PX: BIOPSY: SHX5522

## 2021-10-14 HISTORY — PX: POLYPECTOMY: SHX5525

## 2021-10-14 LAB — GLUCOSE, CAPILLARY: Glucose-Capillary: 140 mg/dL — ABNORMAL HIGH (ref 70–99)

## 2021-10-14 SURGERY — COLONOSCOPY WITH PROPOFOL
Anesthesia: Monitor Anesthesia Care

## 2021-10-14 MED ORDER — PROPOFOL 10 MG/ML IV BOLUS
INTRAVENOUS | Status: DC | PRN
  Administered 2021-10-14: 40 mg via INTRAVENOUS

## 2021-10-14 MED ORDER — SODIUM CHLORIDE 0.9 % IV SOLN: INTRAVENOUS | Status: DC

## 2021-10-14 MED ORDER — PROPOFOL 500 MG/50ML IV EMUL
INTRAVENOUS | Status: DC | PRN
Start: 2021-10-14 — End: 2021-10-14
  Administered 2021-10-14: 175 ug/kg/min via INTRAVENOUS

## 2021-10-14 MED ORDER — LIDOCAINE 2% (20 MG/ML) 5 ML SYRINGE
INTRAMUSCULAR | Status: DC | PRN
Start: 2021-10-14 — End: 2021-10-14
  Administered 2021-10-14: 40 mg via INTRAVENOUS

## 2021-10-14 MED ORDER — LACTATED RINGERS IV SOLN: INTRAVENOUS | Status: DC | PRN

## 2021-10-14 SURGICAL SUPPLY — 22 items

## 2021-10-14 NOTE — Anesthesia Procedure Notes (Signed)
Procedure Name: [place] ?Date/Time: 10/14/2021 10:20 AM ?Performed by: Cynda Familia, CRNA ?Pre-anesthesia Checklist: Patient identified, Emergency Drugs available, Suction available, Patient being monitored and Timeout performed ?Patient Re-evaluated:Patient Re-evaluated prior to induction ?Oxygen Delivery Method: Simple face mask ?Placement Confirmation: positive ETCO2 and breath sounds checked- equal and bilateral ?Dental Injury: Teeth and Oropharynx as per pre-operative assessment  ? ? ? ? ?

## 2021-10-14 NOTE — Anesthesia Preprocedure Evaluation (Addendum)
Anesthesia Evaluation  ?Patient identified by MRN, date of birth, ID band ?Patient awake ? ? ? ?Reviewed: ?Allergy & Precautions, NPO status , Patient's Chart, lab work & pertinent test results ? ?Airway ?Mallampati: II ? ?TM Distance: >3 FB ?Neck ROM: Full ? ? ? Dental ? ?(+) Teeth Intact, Dental Advisory Given ?  ?Pulmonary ?asthma , sleep apnea ,  ?  ?breath sounds clear to auscultation ? ? ? ? ? ? Cardiovascular ?hypertension, Pt. on medications ? ?Rhythm:Regular Rate:Normal ? ? ?  ?Neuro/Psych ? Headaches, Anxiety   ? GI/Hepatic ?GERD  Medicated,(+) Hepatitis -  ?Endo/Other  ?diabetes, Type 2, Oral Hypoglycemic Agents ? Renal/GU ?Renal disease  ? ?  ?Musculoskeletal ? ?(+) Arthritis , Fibromyalgia - ? Abdominal ?Normal abdominal exam  (+)   ?Peds ? Hematology ?  ?Anesthesia Other Findings ? ? Reproductive/Obstetrics ? ?  ? ? ? ? ? ? ? ? ? ? ? ? ? ?  ?  ? ? ? ? ? ? ? ?Anesthesia Physical ?Anesthesia Plan ? ?ASA: 2 ? ?Anesthesia Plan: MAC  ? ?Post-op Pain Management:   ? ?Induction: Intravenous ? ?PONV Risk Score and Plan: 0 and Propofol infusion ? ?Airway Management Planned: Natural Airway and Simple Face Mask ? ?Additional Equipment: None ? ?Intra-op Plan:  ? ?Post-operative Plan:  ? ?Informed Consent: I have reviewed the patients History and Physical, chart, labs and discussed the procedure including the risks, benefits and alternatives for the proposed anesthesia with the patient or authorized representative who has indicated his/her understanding and acceptance.  ? ? ? ? ? ?Plan Discussed with: CRNA ? ?Anesthesia Plan Comments:   ? ? ? ? ? ?Anesthesia Quick Evaluation ? ?

## 2021-10-14 NOTE — Op Note (Signed)
[facility] ?Patient Name: [name] ?Procedure Date: 10/14/2021 ?MRN: [id number] ?Attending MD: Jerene Bears , MD ?Date of Birth: Oct 31, 1970 ?CSN: [id number] ?Age: 51 ?Admit Type: Outpatient ?Procedure:                Colonoscopy ?Indications:              Screening in patient at increased risk: Family  ?                          history of colorectal cancer, Last colonoscopy:  ?                          April 2017; pt reports 6-8 months of chronic urgent  ?                          loose stools with diarrhea (no abd pain) ?Providers:                [name]. Hilarie Fredrickson, MD, Ladoris Gene, RN, Jacquelin Hawking  ?                          Houle, Technician ?Referring MD:             Elayne Snare. [place] ?Medicines:                Monitored Anesthesia Care ?Complications:            No immediate complications. ?Estimated Blood Loss:     Estimated blood loss was minimal. ?Procedure:                Pre-Anesthesia Assessment: ?                          - Prior to the procedure, a History and Physical  ?                          was performed, and patient medications and  ?                          allergies were reviewed. The patient's tolerance of  ?                          previous anesthesia was also reviewed. The risks  ?                          and benefits of the procedure and the sedation  ?                          options and risks were discussed with the patient.  ?                          All questions were answered, and informed consent  ?                          was obtained. Prior Anticoagulants: The patient has  ?  taken no previous anticoagulant or antiplatelet  ?                          agents. ASA Grade Assessment: III - A patient with  ?                          severe systemic disease. After reviewing the risks  ?                          and benefits, the patient was deemed in  ?                          satisfactory condition to undergo the procedure. ?                           After obtaining informed consent, the colonoscope  ?                          was passed under direct vision. Throughout the  ?                          procedure, the patient's blood pressure, pulse, and  ?                          oxygen saturations were monitored continuously. The  ?                          CF-HQ190L ([id number]) Olympus colonoscope was  ?                          introduced through the anus and advanced to the  ?                          cecum, identified by appendiceal orifice and  ?                          ileocecal valve. The colonoscopy was performed  ?                          without difficulty. The patient tolerated the  ?                          procedure well. The quality of the bowel  ?                          preparation was good. The ileocecal valve,  ?                          appendiceal orifice, and rectum were photographed. ?Scope In: 10:30:07 AM ?Scope Out: 10:52:19 AM ?Scope Withdrawal Time: 0 hours 18 minutes 32 seconds  ?Total Procedure Duration: 0 hours 22 minutes 12 seconds  ?Findings: ?     The digital rectal exam was normal. ?     Two sessile polyps were found in the ascending colon. The polyps were  5  ?     to 6 mm in size. These polyps were removed with a cold snare. Resection  ?     and retrieval were complete. ?     A 6 mm polyp was found in the proximal transverse colon. The polyp was  ?     sessile. The polyp was removed with a cold snare. Resection and  ?     retrieval were complete. ?     A 5 mm polyp was found in the distal sigmoid colon. The polyp was  ?     sessile. The polyp was removed with a cold snare. Resection and  ?     retrieval were complete. ?     Retroflexion in the rectum was not performed due to narrow rectal vault  ?     and associated rectal spasm. Forward views in the rectum and anal canal  ?     did not reveal abnormalities. ?     The exam was otherwise without abnormality. ?     Biopsies for histology were taken with a cold forceps  from the right  ?     colon and left colon for evaluation of microscopic colitis. ?Impression:               - Two 5 to 6 mm polyps in the ascending colon,  ?                          removed with a cold snare. Resected and retrieved. ?                          - One 6 mm polyp in the proximal transverse colon,  ?                          removed with a cold snare. Resected and retrieved. ?                          - One 5 mm polyp in the distal sigmoid colon,  ?                          removed with a cold snare. Resected and retrieved. ?                          - The examination was otherwise normal. ?                          - Biopsies were taken with a cold forceps from the  ?                          right colon and left colon for evaluation of  ?                          microscopic colitis. ?Moderate Sedation: ?     N/A ?Recommendation:           - Patient has a contact number available for  ?                          emergencies.  The signs and symptoms of potential  ?                          delayed complications were discussed with the  ?                          patient. Return to normal activities tomorrow.  ?                          Written discharge instructions were provided to the  ?                          patient. ?                          - Resume previous diet. ?                          - Continue present medications. ?                          - Await pathology results. If negative for  ?                          microscopic colitis then rifaximin 550 mg TID x 14  ?                          days is recommended for IBS-D. ?                          - Repeat colonoscopy is recommended. The  ?                          colonoscopy date will be determined after pathology  ?                          results from today's exam become available for  ?                          review. ?Procedure Code(s):        --- Professional --- ?                          [phone], Colonoscopy, flexible; with  removal of  ?                          tumor(s), polyp(s), or other lesion(s) by snare  ?                          technique ?                          45380, 59, Colonoscopy, flexible; with biopsy,  ?                          single or multiple ?Diagnosis Code(s):        ---  Professional --- ?                          K63.5, Polyp of colon ?                          Z80.0, Family history of malignant neoplasm of  ?                          digestive organs ?CPT copyright 2019 American Medical Association. All rights reserved. ?The codes documented in this report are preliminary and upon coder review may  ?be revised to meet current compliance requirements. ?Jerene Bears, MD ?10/14/2021 11:03:07 AM ?This report has been signed electronically. ?Number of Addenda: 0 ?

## 2021-10-14 NOTE — Transfer of Care (Signed)
Immediate Anesthesia Transfer of Care Note ? ?Patient: JIAYI LENGACHER ? ?Procedure(s) Performed: COLONOSCOPY WITH PROPOFOL ?POLYPECTOMY ?BIOPSY ? ?Patient Location: PACU and Endoscopy Unit ? ?Anesthesia Type:MAC ? ?Level of Consciousness: awake and alert  ? ?Airway & Oxygen Therapy: Patient Spontanous Breathing and Patient connected to face mask oxygen ? ?Post-op Assessment: Report given to RN and Post -op Vital signs reviewed and stable ? ?Post vital signs: Reviewed and stable ? ?Last Vitals:  ?Vitals Value Taken Time  ?BP 113/55 10/14/21 1058  ?Temp    ?Pulse 85 10/14/21 1059  ?Resp 15 10/14/21 1059  ?SpO2 94 % 10/14/21 1059  ?Vitals shown include unvalidated device data. ? ?Last Pain:  ?Vitals:  ? 10/14/21 0857  ?TempSrc: Temporal  ?PainSc: 0-No pain  ?   ? ?  ? ?Complications: No notable events documented. ?

## 2021-10-14 NOTE — Anesthesia Postprocedure Evaluation (Signed)
Anesthesia Post Note ? ?Patient: [name] ? ?Procedure(s) Performed: COLONOSCOPY WITH PROPOFOL ?POLYPECTOMY ?BIOPSY ? ?  ? ?Patient location during evaluation: PACU ?Anesthesia Type: MAC ?Level of consciousness: awake and alert ?Pain management: pain level controlled ?Vital Signs Assessment: post-procedure vital signs reviewed and stable ?Respiratory status: spontaneous breathing, nonlabored ventilation, respiratory function stable and patient connected to nasal cannula oxygen ?Cardiovascular status: stable and blood pressure returned to baseline ?Postop Assessment: no apparent nausea or vomiting ?Anesthetic complications: no ? ? ?No notable events documented. ? ?Last Vitals:  ?Vitals:  ? 10/14/21 1109 10/14/21 1119  ?BP: 112/79 132/86  ?Pulse: 75 79  ?Resp: 17 19  ?Temp:    ?SpO2: 96% 98%  ?  ?Last Pain:  ?Vitals:  ? 10/14/21 1119  ?TempSrc:   ?PainSc: 0-No pain  ? ? ?  ?  ?  ?  ?  ?  ? ?Effie Berkshire ? ? ? ? ?

## 2021-10-14 NOTE — H&P (Signed)
? ?GASTROENTEROLOGY PROCEDURE H&P NOTE  ? ?Primary Care Physician: ?Kathyrn Drown, MD ? ? ? ?Reason for Procedure:  Family history of colon cancer ? ?Plan:    Colonoscopy ? ?Patient is appropriate for endoscopic procedure(s) in the ambulatory ([place]) setting. ? ?The nature of the procedure, as well as the risks, benefits, and alternatives were carefully and thoroughly reviewed with the patient. Ample time for discussion and questions allowed. The patient understood, was satisfied, and agreed to proceed.  ? ? ? ?HPI: ?[name] is a 51 y.o. female who presents for colonoscopy.  Medical history as below.  Tolerated the prep.  No recent chest pain or shortness of breath.  No abdominal pain today. ? ?She reports 6 to 8 months of more frequent, looser and very urgent stools.  Multiple accidents.  Intermittent.  2-6 times per day.  Nonbloody nonmelenic. ? ? ?Past Medical History:  ?Diagnosis Date  ? Allergy   ? Anemia   ? hx  ? Anxiety   ? takes Klonopin daily as needed.takes Celexa daily  ? Arthritis   ? left foot and hands  ? Asthma   ? exercise induced  ? Back pain   ? Bilateral swelling of feet   ? Complication of anesthesia   ? slow to wake  up  ? Diabetes mellitus without complication ([place])   ? Diastolic dysfunction 56/8127  ? per patient   ? Granuloma annulare   ? Herpes infection   ? High cholesterol   ? takes Pravastatin daily  ? History of bronchitis   ? several yrs ago  ? History of migraine   ? last one about 2 wks ago  ? Hypertension   ? IBS (irritable bowel syndrome)   ? takes Trulance and ProBiotic Daily  ? Inflammation of hair follicles   ? takes Minocycline daily  ? Insomnia   ? takes Melatonin nightly  ? Joint pain   ? Lactose intolerance   ? Liver fibrosis   ? Menopausal state   ? Migraine   ? NASH (nonalcoholic steatohepatitis)   ? Prediabetes 01/01/2020  ? Sleep apnea   ? sleep study > 5 yrs ago. no CPAP use   ? Sleep apnea   ? Spastic bladder   ? takes Myrbetriq daily  ? Tingling   ? r/t  neck. Pseudoarthrosis   ? UTI (urinary tract infection)   ? hx of but takes Macrodantin daily. has been on for 2 1/2 yrs   ? ? ?Past Surgical History:  ?Procedure Laterality Date  ? ABDOMINAL HYSTERECTOMY  2005  ? complete hysterectomy both ovaries removed  ? BREAST REDUCTION SURGERY  2009  ? CARPAL TUNNEL RELEASE    ? carpel tunnel Bilateral 02/2017, 2021  ? [place] SURGERY  2007, 2009, 2014, 2017  ? anterior  x 3 , posterior x 2   ? CESAREAN SECTION  94/03  ? x 2  ? COLONOSCOPY    ? ESOPHAGOGASTRODUODENOSCOPY    ? POSTERIOR CERVICAL FUSION/FORAMINOTOMY N/A 01/07/2013  ? Procedure: POSTERIOR CERVICAL FUSION/FORAMINOTOMY LEVEL 2;  Surgeon: Hosie Spangle, MD;  Location: [facility];  Service: Neurosurgery;  Laterality: N/A;  Posterior Cervical Five-Six/Six-Seven Arthrodesis with Instrumentation  ? POSTERIOR CERVICAL FUSION/FORAMINOTOMY N/A 07/04/2016  ? Procedure: CERVICAL THREE-FOUR POSTERIOR CERVICAL ARTHRODESIS;  Surgeon: Jovita Gamma, MD;  Location: [place];  Service: Neurosurgery;  Laterality: N/A;  C3-C4 POSTERIOR CERVICAL ARTHRODESIS  ? tummy tuck    ? TYMPANOSTOMY TUBE PLACEMENT Bilateral   ?  As a baby  ? UPPER GASTROINTESTINAL ENDOSCOPY    ? ? ?Prior to Admission medications   ?Medication Sig Start Date End Date Taking? Authorizing Provider  ?acyclovir (ZOVIRAX) 400 MG tablet TAKE 1 TABLET BY MOUTH EVERY DAY 04/28/21  Yes Luking, Scott A, MD  ?albuterol (VENTOLIN HFA) 108 (90 Base) MCG/ACT inhaler INHALE 2 PUFFS BY MOUTH EVERY 6 HOURS AS NEEDED FOR WHEEZE 07/08/20  Yes Luking, Elayne Snare, MD  ?Cholecalciferol (VITAMIN D3) 125 MCG (5000 UT) CAPS Take 25,000 Units by mouth in the morning.   Yes [provider]  ?citalopram (CELEXA) 40 MG tablet TAKE 1 TABLET BY MOUTH EVERY DAY 10/13/21  Yes Luking, Scott A, MD  ?clonazePAM (KLONOPIN) 1 MG tablet TAKE 1/2 TO 1 TABLET BY MOUTH TWICE DAILY AS NEEDED FOR ANXIETY AND/OR STRESS ?Patient taking differently: Take 1 mg by mouth in the morning. 06/30/21   Yes Kathyrn Drown, MD  ?Coenzyme Q10 (COQ10 PO) Take 300 mg by mouth in the morning.   Yes [provider]  ?cyclobenzaprine (FLEXERIL) 10 MG tablet Take 0.5-1 tablets (5-10 mg total) by mouth 3 (three) times daily as needed for muscle spasms. 07/05/16  Yes Jovita Gamma, MD  ?doxepin (SINEQUAN) 10 MG capsule Take 1 capsule (10 mg total) by mouth at bedtime. ?Patient taking differently: Take 10 mg by mouth at bedtime as needed (anxiety). 09/30/21 10/30/21 Yes Valentina Shaggy, MD  ?estradiol (ESTRACE) 2 MG tablet TAKE 1 TABLET BY MOUTH EVERY DAY 06/25/21  Yes Florian Buff, MD  ?famotidine (PEPCID) 20 MG tablet Take 1 tablet (20 mg total) by mouth 2 (two) times daily. 09/30/21 10/30/21 Yes Valentina Shaggy, MD  ?furosemide (LASIX) 20 MG tablet TAKE 1 TABLET BY MOUTH EVERY DAY IN THE MORNING 10/13/21  Yes Luking, Elayne Snare, MD  ?ibuprofen (ADVIL,MOTRIN) 800 MG tablet Take 800 mg by mouth every 8 (eight) hours as needed. for pain 05/26/16  Yes [provider]  ?Astrid Drafts 500 MG CAPS Take 500 mg by mouth every evening.   Yes [provider]  ?levocetirizine (XYZAL) 5 MG tablet Take 1 tablet (5 mg total) by mouth in the morning and at bedtime. 09/30/21 10/30/21 Yes Valentina Shaggy, MD  ?liothyronine (CYTOMEL) 5 MCG tablet Take 5 mcg by mouth daily before breakfast.   Yes [provider]  ?lisinopril (ZESTRIL) 20 MG tablet Take 1 tablet (20 mg total) by mouth daily. 04/14/21  Yes Kathyrn Drown, MD  ?MAGNESIUM-POTASSIUM PO Take 1 tablet by mouth daily. With Zinc   Yes [provider]  ?metFORMIN (GLUCOPHAGE-XR) 500 MG 24 hr tablet TAKE 1 TABLET BY MOUTH TWICE A DAY 09/06/21  Yes Luking, Scott A, MD  ?MYRBETRIQ 50 MG TB24 tablet TAKE 1 TABLET BY MOUTH EVERY DAY 10/28/20  Yes Florian Buff, MD  ?NON FORMULARY 1 Dose by Implant route every 3 (three) months.   Yes [provider]  ?rosuvastatin (CRESTOR) 10 MG tablet TAKE 1 TABLET BY MOUTH EVERY DAY 06/25/21  Yes  Kathyrn Drown, MD  ?TURMERIC PO Take 1 capsule by mouth in the morning and at bedtime.   Yes [provider]  ?valACYclovir (VALTREX) 1000 MG tablet TAKE 2 TABLETS BY MOUTH NOW THEN 2 TABLETS BY MOUTH 12 HOURS LATER ?Patient taking differently: Take 1,000 mg by mouth 2 (two) times daily as needed (outbreaks). 06/10/21  Yes Kathyrn Drown, MD  ?vitamin E 180 MG (400 UNITS) capsule Take 400 Units by mouth in the  morning and at bedtime.   Yes [provider]  ? ? ?Current Facility-Administered Medications  ?Medication Dose Route Frequency Provider Last Rate Last Admin  ? 0.9 %  sodium chloride infusion   Intravenous Continuous Sohail Capraro, [name], MD      ? ? ?Allergies as of 07/28/2021 - Review Complete 04/14/2021  ?Allergen Reaction Noted  ? Cefdinir Itching 10/09/2020  ? Dairy aid [tilactase]  03/15/2016  ? Gluten meal  03/15/2016  ? Levaquin [levofloxacin in d5w] Diarrhea and Nausea And Vomiting 01/07/2013  ? Other  11/02/2020  ? Topamax [topiramate]  11/02/2015  ? Wheat bran  03/15/2016  ? ? ?Family History  ?Problem Relation Age of Onset  ? Ovarian cancer Mother   ? Hypertension Mother   ? Irritable bowel syndrome Mother   ? Neuropathy Mother   ? Hyperlipidemia Mother   ? Cancer Mother   ? Cirrhosis Father   ?     alcohol abuse  ? Diabetes Father   ? Hypertension Father   ? Alcoholism Father   ? Hyperlipidemia Father   ? Liver disease Father   ? Alcohol abuse Father   ? Drug abuse Brother   ? Colon cancer Maternal Grandmother   ?     age unknown-24's  ? CAD Maternal Grandfather   ? Colon polyps Neg Hx   ? Esophageal cancer Neg Hx   ? Rectal cancer Neg Hx   ? Stomach cancer Neg Hx   ? ? ?Social History  ? ?Socioeconomic History  ? Marital status: Married  ?  Spouse name: Laverna Peace  ? Number of children: 2  ? Years of education: college  ? Highest education level: Associate degree: academic program  ?Occupational History  ? Occupation: self employed-janitorial  ?Tobacco Use  ? Smoking status: Never  ?  Smokeless tobacco: Never  ?Vaping Use  ? Vaping Use: Never used  ?Substance and Sexual Activity  ? Alcohol use: Yes  ?  Alcohol/week: 1.0 standard drink  ?  Types: 1 Glasses of wine per week  ?  Comment: occasi

## 2021-10-14 NOTE — Discharge Instructions (Signed)
YOU HAD AN ENDOSCOPIC PROCEDURE TODAY: Refer to the procedure report and other information in the discharge instructions given to you for any specific questions about what was found during the examination. If this information does not answer your questions, please call [place] office at [phone] to clarify.  ° °YOU SHOULD EXPECT: Some feelings of bloating in the abdomen. Passage of more gas than usual. Walking can help get rid of the air that was put into your GI tract during the procedure and reduce the bloating. If you had a lower endoscopy (such as a colonoscopy or flexible sigmoidoscopy) you may notice spotting of blood in your stool or on the toilet paper. Some abdominal soreness may be present for a day or two, also. ° °DIET: Your first meal following the procedure should be a light meal and then it is ok to progress to your normal diet. A half-sandwich or bowl of soup is an example of a good first meal. Heavy or fried foods are harder to digest and may make you feel nauseous or bloated. Drink plenty of fluids but you should avoid alcoholic beverages for 24 hours. If you had a esophageal dilation, please see attached instructions for diet.   ° °ACTIVITY: Your care partner should take you home directly after the procedure. You should plan to take it easy, moving slowly for the rest of the day. You can resume normal activity the day after the procedure however YOU SHOULD NOT DRIVE, use power tools, machinery or perform tasks that involve climbing or major physical exertion for 24 hours (because of the sedation medicines used during the test).  ° °SYMPTOMS TO REPORT IMMEDIATELY: °A gastroenterologist can be reached at any hour. Please call [phone]  for any of the following symptoms:  °Following lower endoscopy (colonoscopy, flexible sigmoidoscopy) °Excessive amounts of blood in the stool  °Significant tenderness, worsening of abdominal pains  °Swelling of the abdomen that is new, acute  °Fever of 100° or  higher  °Following upper endoscopy (EGD, EUS, ERCP, esophageal dilation) °Vomiting of blood or coffee ground material  °New, significant abdominal pain  °New, significant chest pain or pain under the shoulder blades  °Painful or persistently difficult swallowing  °New shortness of breath  °Black, tarry-looking or red, bloody stools ° °FOLLOW UP:  °If any biopsies were taken you will be contacted by phone or by letter within the next 1-3 weeks. Call [phone]  if you have not heard about the biopsies in 3 weeks.  °Please also call with any specific questions about appointments or follow up tests. ° °

## 2021-10-15 ENCOUNTER — Encounter (HOSPITAL_COMMUNITY): Payer: Self-pay | Admitting: Internal Medicine

## 2021-10-15 ENCOUNTER — Other Ambulatory Visit: Payer: Self-pay | Admitting: Family Medicine

## 2021-10-18 ENCOUNTER — Telehealth: Payer: Self-pay | Admitting: *Deleted

## 2021-10-18 NOTE — Telephone Encounter (Signed)
-----   Message from Valentina Shaggy, MD sent at 10/01/2021  6:02 AM EST ----- ?Failed Eucrisa. Is this enough? ?

## 2021-10-18 NOTE — Telephone Encounter (Addendum)
Reached out to [facility] [place] [facility] care to find out next step after denial. Was advised that MD needs to write letter of medical necessity for drug for reconsideration. Do you want me to appeal or do you want to have a hand in this. Her denial initially stated eucrisa, phototherapy and immunosuppressives as alternate therapy but after trial of eucrisa not lists the other two options on denial? ?NO peer to peer option ?

## 2021-10-19 LAB — SURGICAL PATHOLOGY

## 2021-10-19 NOTE — Telephone Encounter (Signed)
I am repairing the letter. ? ?Salvatore Marvel, MD ?Allergy and [place] [facility] ? ?

## 2021-10-19 NOTE — Telephone Encounter (Signed)
Attn: [place] RX [facility] Appeals ?Fax: [phone] ?Patient:[name]  ?DOB:Apr 28, 1971 ?Member ID: [id number] ? ?Denial due to requirement of phototherapy and immunosuppressants  ?

## 2021-10-19 NOTE — Telephone Encounter (Signed)
I am happy to write a letter.  I have not started.  What is the policy number and name of her pharmacy benefit?  Is there a specific person that should address this to?  I might send you a draft before I print it.  ? ?Salvatore Marvel, MD ?Allergy and [place] of [facility] ? ?

## 2021-10-20 ENCOUNTER — Telehealth: Payer: Self-pay | Admitting: Internal Medicine

## 2021-10-20 NOTE — Telephone Encounter (Signed)
Patient called in with complaints of loose stools occurring multiple times throughout the day. No relief with imodium. She understands that her biopsy results are not back yet from procedure & will need to wait for those to return before a trial of medication is prescribed, but is looking for recommendations in the meantime. ? ?Dr. Hilarie Fredrickson, please advise. Thank you!  ?

## 2021-10-20 NOTE — Telephone Encounter (Signed)
Inbound call from patient requesting call back with results and also states she is still unable to control her bowels  ?

## 2021-10-20 NOTE — Telephone Encounter (Signed)
Please let patient know her pathology results from recent colonoscopy ?3 of the 4 polyps removed were adenomatous (precancer but not cancer); they have been fully removed ?The fourth polyp was benign and not precancerous ?Random biopsies negative for microscopic colitis ? ?Given ongoing symptoms I recommend the following: ?1.  Rifaximin 550 mg 3 times daily x14 days --diagnosis for this is IBS-D; if diarrhea not better after antibiotic she should let me know ?2.  Please place recall for colonoscopy for 5 years for surveillance ?3.  Once she is notified pathology letter not needed ?

## 2021-10-21 ENCOUNTER — Other Ambulatory Visit: Payer: Self-pay

## 2021-10-21 MED ORDER — RIFAXIMIN 550 MG PO TABS: 550.0000 mg | ORAL_TABLET | Freq: Three times a day (TID) | ORAL | 0 refills | Status: AC

## 2021-10-21 NOTE — Telephone Encounter (Signed)
Spoke with patient regarding results & recommendations. Prescription sent into [facility] and records faxed. ?

## 2021-10-26 NOTE — Telephone Encounter (Signed)
Appeal sent to Ins ?

## 2021-10-27 NOTE — Telephone Encounter (Signed)
L/m for patient to contact me. Her appeal was denied for Dupixent so we are going to try and get her Dupixent through patient assistance and need to send her app to sign ?

## 2021-10-27 NOTE — Telephone Encounter (Signed)
Spoke to patient and advised appeal denial so we are going to try and get patient free drug from patient assistance for [place] and will mail her app ?

## 2021-10-28 NOTE — Telephone Encounter (Signed)
I hate insurance companies. Can I send them a picture of my middle finger? ? ?Salvatore Marvel, MD ?Allergy and [place] [facility] ? ?

## 2021-11-03 ENCOUNTER — Other Ambulatory Visit: Payer: Self-pay | Admitting: Family Medicine

## 2021-11-05 ENCOUNTER — Telehealth: Payer: Self-pay

## 2021-11-05 NOTE — Telephone Encounter (Signed)
[place] [facility] called to see if we had received a fax regarding patient's xifaxan. She is going to send a new fax to [phone] since it was previously sent to a different fax machine and we had not received it. ?

## 2021-11-05 NOTE — Telephone Encounter (Signed)
We just received a fax from [facility] about this patient's Xifaxan. Fax only stated that prescription is being placed on hold since they have "not heard back "from our office. I returned the fax asking what information was needed or what was needed of our office since there is no indication of this. Will await additional information. ?

## 2021-11-10 ENCOUNTER — Other Ambulatory Visit: Payer: Self-pay | Admitting: Family Medicine

## 2021-11-15 ENCOUNTER — Telehealth: Payer: Self-pay

## 2021-11-15 ENCOUNTER — Other Ambulatory Visit: Payer: Self-pay | Admitting: Obstetrics & Gynecology

## 2021-11-15 NOTE — Telephone Encounter (Signed)
Patient called in stating she still had not heard anything else from [facility] regarding [place]. Will route this to Dr. Vena Rua CMA who followed up with [facility] via fax on 11/05/21.  ?

## 2021-11-16 NOTE — Telephone Encounter (Signed)
We still have not received any additional correspondence from [facility]. I contacted them by phone this time and am told that a paper PA must been done prior to approval. They will send paper PA for [place] to fill out. ?

## 2021-11-16 NOTE — Telephone Encounter (Addendum)
PA has been signed and faxed back to [facility] as well as [employer].Patient advised. ?

## 2021-11-16 NOTE — Telephone Encounter (Signed)
Patient's insurance has denied Xifaxan. They indicate that patient must have tried and failed dicyclomine and Lotronex first. "If patient does not clinically improve after an adequate trial of each class of recommended medications, patient will be eligible for xifaxan. ? ?Dr Hilarie Fredrickson- ?Please advise. ?

## 2021-11-17 NOTE — Telephone Encounter (Signed)
Samples pls for uncovered medication despite adequate indication, rifaximin 550 mg 3 times daily x14 days ?

## 2021-11-17 NOTE — Telephone Encounter (Signed)
Spoke with patient and told her I would leave Xifaxan samples up front for her to pick up.  Patient agreed.  ?

## 2021-12-02 ENCOUNTER — Other Ambulatory Visit: Payer: Self-pay | Admitting: Family Medicine

## 2021-12-03 ENCOUNTER — Telehealth: Payer: Self-pay | Admitting: *Deleted

## 2021-12-03 NOTE — Telephone Encounter (Signed)
L/m for patient that she has been approved for PAP free drug from [place] [employer] and someone there will be reaching out to her to schedule shipment and if she needs any help she can reach out to me ?

## 2021-12-13 ENCOUNTER — Other Ambulatory Visit: Payer: Self-pay

## 2021-12-13 DIAGNOSIS — R197 Diarrhea, unspecified: Secondary | ICD-10-CM

## 2021-12-13 NOTE — Telephone Encounter (Signed)
Spoke with pt and she is aware of Dr. Vena Rua recommendations.

## 2021-12-13 NOTE — Telephone Encounter (Signed)
Once stool submitted, can try OTC loperamide  Once studies back I am inclined to try [employer]

## 2021-12-13 NOTE — Telephone Encounter (Signed)
Spoke with pt and she reports she still has her gallbladder, she has not taken  any Imodium. She knows to come for labs, orders in epic. Appt to be scheduled to see provider. Please advise of any other recs.

## 2021-12-13 NOTE — Telephone Encounter (Signed)
Pt states that the xifaxan has not helped her at all. Reports she is still having 5-7 liquid diarrhea stools/Day. Please advise.

## 2021-12-13 NOTE — Telephone Encounter (Signed)
Patient called states the Xifaxan medication did not help her seeking further advise.

## 2021-12-13 NOTE — Telephone Encounter (Signed)
Please determine if she has had prior cholecystectomy She needs an office visit for continuity I am sorry the rifaximin did not help with her symptoms Is she taking anything over-the-counter such as Imodium?  Would check fecal white cells, ova and parasite, c diff PCR and fecal elastase  Recs after the above info and tests

## 2021-12-14 ENCOUNTER — Other Ambulatory Visit: Payer: Self-pay | Admitting: Family Medicine

## 2021-12-17 ENCOUNTER — Other Ambulatory Visit: Payer: Self-pay | Admitting: Family Medicine

## 2021-12-21 ENCOUNTER — Other Ambulatory Visit: Payer: 59

## 2021-12-22 NOTE — Telephone Encounter (Signed)
Made appt for June the 25th at 2:50 for Medication follow up pt is out of meds now

## 2021-12-23 NOTE — Telephone Encounter (Signed)
Medication Follow up on 06/26

## 2021-12-24 ENCOUNTER — Other Ambulatory Visit: Payer: 59

## 2021-12-24 DIAGNOSIS — R197 Diarrhea, unspecified: Secondary | ICD-10-CM

## 2021-12-27 LAB — CLOSTRIDIUM DIFFICILE BY PCR: Toxigenic C. Difficile by PCR: NEGATIVE

## 2021-12-30 LAB — OVA AND PARASITE EXAMINATION
CONCENTRATE RESULT:: NONE SEEN
MICRO NUMBER:: 13476341
SPECIMEN QUALITY:: ADEQUATE
TRICHROME RESULT:: NONE SEEN

## 2021-12-30 LAB — FECAL LEUKOCYTES
Micro Number: 13476362
Result: NOT DETECTED
Specimen Quality: ADEQUATE

## 2021-12-31 LAB — PANCREATIC ELASTASE, FECAL: Pancreatic Elastase-1, Stool: >500 mcg/g

## 2022-01-01 ENCOUNTER — Other Ambulatory Visit: Payer: Self-pay | Admitting: Family Medicine

## 2022-01-02 ENCOUNTER — Other Ambulatory Visit: Payer: Self-pay | Admitting: Family Medicine

## 2022-01-04 ENCOUNTER — Telehealth: Payer: Self-pay | Admitting: Internal Medicine

## 2022-01-04 ENCOUNTER — Other Ambulatory Visit: Payer: Self-pay | Admitting: Obstetrics & Gynecology

## 2022-01-04 MED ORDER — VIBERZI 75 MG PO TABS: 1.0000 | ORAL_TABLET | Freq: Two times a day (BID) | ORAL | 0 refills | Status: DC

## 2022-01-04 MED ORDER — VIBERZI 75 MG PO TABS
1.0000 | ORAL_TABLET | Freq: Two times a day (BID) | ORAL | 0 refills | Status: AC
Start: 2022-01-04 — End: ?

## 2022-01-04 NOTE — Telephone Encounter (Signed)
I spoke with Ronesha to confirm the pharmacy to fax her Viberzi to[facility] [place] She requested 90 day's worth. She has made a f/u appointment for August. She will contact us if she has any problems with the medicine. I will let Dr Hilarie Fredrickson know she wants to try it.

## 2022-01-04 NOTE — Telephone Encounter (Signed)
Patient called saying she has decided that she wants to try the viberzi medication.

## 2022-01-07 ENCOUNTER — Other Ambulatory Visit (HOSPITAL_COMMUNITY): Payer: Self-pay

## 2022-01-07 ENCOUNTER — Telehealth: Payer: Self-pay | Admitting: Pharmacy Technician

## 2022-01-07 NOTE — Telephone Encounter (Signed)
Patient Advocate Encounter  Received notification from [place] that prior authorization for VIBERZI 75MG is required.   PA submitted on 6.16.23 VIA FAX [phone] [facility] CARE Status is pending    Luciano Cutter, CPhT Patient Advocate Phone: [phone]

## 2022-01-12 NOTE — Telephone Encounter (Signed)
Patient called to advise more information is required for the Viberzi medication from insurance.

## 2022-01-17 ENCOUNTER — Ambulatory Visit (INDEPENDENT_AMBULATORY_CARE_PROVIDER_SITE_OTHER): Payer: 59 | Admitting: Family Medicine

## 2022-01-17 DIAGNOSIS — R55 Syncope and collapse: Secondary | ICD-10-CM

## 2022-01-17 MED ORDER — CITALOPRAM HYDROBROMIDE 40 MG PO TABS: 40.0000 mg | ORAL_TABLET | Freq: Every day | ORAL | 1 refills | Status: DC

## 2022-01-17 MED ORDER — METFORMIN HCL ER 500 MG PO TB24: 500.0000 mg | ORAL_TABLET | Freq: Two times a day (BID) | ORAL | 5 refills | Status: DC

## 2022-01-17 MED ORDER — FUROSEMIDE 20 MG PO TABS
ORAL_TABLET | ORAL | 1 refills | Status: DC
Start: 2022-01-17 — End: 2022-08-08

## 2022-01-17 MED ORDER — ROSUVASTATIN CALCIUM 10 MG PO TABS
10.0000 mg | ORAL_TABLET | Freq: Every day | ORAL | 1 refills | Status: DC
Start: 2022-01-17 — End: 2022-05-09

## 2022-01-17 MED ORDER — CLONAZEPAM 1 MG PO TABS: ORAL_TABLET | ORAL | 5 refills | Status: DC

## 2022-01-17 NOTE — Progress Notes (Signed)
   Subjective:    Patient ID: [name], female    DOB: Apr 22, 1971, 51 y.o.   MRN: [id number]  HPI Syncopy and memory lapse issues , was seen at [facility] Patient had blackout spell hit her head.  Does not know why that happened.  Did not have any signs of illness.  No witnessed seizure.  Denies any chest pressure tightness shortness of breath or palpitations.  Currently being worked up by cardiology and neurology Unable to look at their notes Looks like they are doing appropriate work-up  States referred to cardio and neuro Virtual Visit via Video Note  I connected with [name] on 01/19/22 at  2:50 PM EDT by a video enabled telemedicine application and verified that I am speaking with the correct person using two identifiers.  Location: Patient: Home Provider: Office Video not possible had to do telephone visit   I discussed the limitations of evaluation and management by telemedicine and the availability of in person appointments. The patient expressed understanding and agreed to proceed.  History of Present Illness:    Observations/Objective:   Assessment and Plan:   Follow Up Instructions:    I discussed the assessment and treatment plan with the patient. The patient was provided an opportunity to ask questions and all were answered. The patient agreed with the plan and demonstrated an understanding of the instructions.   The patient was advised to call back or seek an in-person evaluation if the symptoms worsen or if the condition fails to improve as anticipated.  I provided 15 minutes of non-face-to-face time during this encounter.   [name], MD   Review of Systems     Objective:   Physical Exam  Today's visit was via telephone Physical exam was not possible for this visit       Assessment & Plan:  Telephone visit Syncope Supportive measures discussed Follow through with cardiology and neurology Follow-up with [place] later in the  fall If patient has other needs she will let us know but there is really no intervention on our part currently

## 2022-01-19 DIAGNOSIS — R55 Syncope and collapse: Secondary | ICD-10-CM | POA: Insufficient documentation

## 2022-02-01 ENCOUNTER — Encounter: Payer: Self-pay | Admitting: Allergy & Immunology

## 2022-02-01 ENCOUNTER — Ambulatory Visit (INDEPENDENT_AMBULATORY_CARE_PROVIDER_SITE_OTHER): Payer: 59 | Admitting: Allergy & Immunology

## 2022-02-01 VITALS — BP 130/86 | HR 106 | Temp 97.8°F | Resp 16 | Ht 65.0 in | Wt 215.1 lb

## 2022-02-01 DIAGNOSIS — L299 Pruritus, unspecified: Secondary | ICD-10-CM | POA: Diagnosis not present

## 2022-02-01 DIAGNOSIS — E7800 Pure hypercholesterolemia, unspecified: Secondary | ICD-10-CM

## 2022-02-01 DIAGNOSIS — R21 Rash and other nonspecific skin eruption: Secondary | ICD-10-CM | POA: Diagnosis not present

## 2022-02-01 DIAGNOSIS — Z5181 Encounter for therapeutic drug level monitoring: Secondary | ICD-10-CM

## 2022-02-01 MED ORDER — LEVOCETIRIZINE DIHYDROCHLORIDE 5 MG PO TABS: 5.0000 mg | ORAL_TABLET | Freq: Two times a day (BID) | ORAL | 5 refills | Status: DC

## 2022-02-01 MED ORDER — FAMOTIDINE 20 MG PO TABS: 20.0000 mg | ORAL_TABLET | Freq: Two times a day (BID) | ORAL | 5 refills | Status: DC

## 2022-02-01 NOTE — Patient Instructions (Addendum)
1. Rash - We are getting some labs to look for the most common food allergens. - We are also going to look at lipid levels as well as tuberculosis screening and a complete blood count (since we are starting Rinvoq). - Start Rinvoq 69m daily (samples provided for 6 weeks until we see each other again). - Side effects discussed, but I am glad that you got a throuhg cardiac workup.  - Continue with Xyzal 538mtwice daily. - Continue with Pepcid (famotidine) 2057mwice daily - Continue with Dupixent as well (the Rinvoq might just be a bridge until DupHallettarts working again). I think she needs to get off the medication overall.   2. Return in about 6 weeks (around 03/15/2022).    Please inform us [place] any Emergency Department visits, hospitalizations, or changes in symptoms. Call us [place] going to the ED for breathing or allergy symptoms since we might be able to fit you in for a sick visit. Feel free to contact us [place] with any questions, problems, or concerns.  It was a pleasure to see you again today!  Websites that have reliable patient information: 1. American Academy of Asthma, Allergy, and Immunology: www.aaaai.org 2. Food Allergy Research and Education (FARE): foodallergy.org 3. Mothers of Asthmatics: http://www.asthmacommunitynetwork.org 4. American College of Allergy, Asthma, and Immunology: www.acaai.org   COVID-19 Vaccine Information can be found at: [email or web address] questions related to vaccine distribution or appointments, please email [email or web address] or call [phone] We realize that you might be concerned about having an allergic reaction to the COVID19 vaccines. To help with that concern, WE ARE OFFERING THE COVID19 VACCINES IN OUR OFFICE! Ask the front desk for dates!     "Like" us [place] Facebook and Instagram for our latest updates!      A healthy democracy works best when ALLNew York Life Insurancearticipate! Make sure you are registered to vote! If you have moved or changed any of your contact information, you will need to get this updated before voting!  In some cases, you MAY be able to register to vote online: [email or web address]

## 2022-02-01 NOTE — Progress Notes (Unsigned)
FOLLOW UP  Date of Service/Encounter:  02/01/22   Assessment:   Exercise-induced bronchospasm - with normal spirometry    Rash - looks consistent with contact dermatitis with some areas of eczema, previously improving on Dupixent   Unfortunately, her rash seems to have gotten a lot worse with the 2 months off of the [place].  She was doing great and we are giving her samples, but her insurance never approved the [place] despite the improvement in the symptoms.  She was finally approved for patient assistance and is receiving free drug from the company.  However, for what ever reason, she never seems to have been able to get under as good control.  In fact, it has seemed to get worse.  We are going to add on Renvela today to see if this can help control the itching.  Certainly the rash is a problem, but the itching is a huge quality-of-life issue for her.  We gave her several weeks of samples while we try to get this approved.    Plan/Recommendations:   1. Rash - We are getting some labs to look for the most common food allergens. - We are also going to look at lipid levels as well as tuberculosis screening and a complete blood count (since we are starting Rinvoq). - Start Rinvoq 32m daily (samples provided for 6 weeks until we see each other again). - Side effects discussed, but I am glad that you got a throuhg cardiac workup.  - Continue with Xyzal 582mtwice daily. - Continue with Pepcid (famotidine) 204mwice daily - Continue with Dupixent as well (the Rinvoq might just be a bridge until DupInavalearts working again). I think she needs to get off the medication overall.   2. Return in about 6 weeks (around 03/15/2022).    Subjective:   [name] a [age]o. female presenting today for follow up of  Chief Complaint  Patient presents with   Follow-up   Urticaria    [name] a history of the following: Patient Active Problem List   Diagnosis  Date Noted   Syncope 01/19/2022   Family history of colon cancer    Chronic diarrhea    Benign neoplasm of ascending colon    Benign neoplasm of transverse colon    Benign neoplasm of sigmoid colon    Allergic drug reaction 10/09/2020   Pruritic condition 10/09/2020   Vitamin D deficiency 05/20/2020   Hypertension    Spastic bladder    Prediabetes 01/04/44/8099Diastolic dysfunction 05/83/38/2505DDD (degenerative disc disease), lumbar 11/13/2019   Spinal stenosis of lumbar region 09/20/2019   Numbness of hand 05/20/2019   Acquired hypothyroidism 07/13/2017   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   Gluten-sensitive enteropathy 07/13/2017   Lactose intolerance in adult 07/13/2017   History of decompression of median nerve 02/17/2017   Carpal tunnel syndrome 12/21/2016   Pseudoarthrosis of cervical spine ([place]2/05/2016   History of arthrodesis 08/25/2015   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 07/16/2015   Spinal cord disease ([facility]/16/2016   Cervical spondylosis with myelopathy 07/10/2015   DDD (degenerative disc disease), cervical 07/10/2015   Fibromyalgia 07/07/2015   Lumbar spondylosis 03/05/2014   Degeneration of lumbar intervertebral disc 03/05/2014   Paresthesia of lower extremity 02/04/2014   Obesity, unspecified 05/09/2013   Bilateral renal cysts 05/09/2013   Dyspareunia 02/25/2013   Mild intermittent asthma without complication 06/39/76/7341Hyperglycemia  11/07/2012   Other malaise and fatigue 11/07/2012   Anxiety state 04/22/2010   GERD 04/22/2010   ILEUS 04/22/2010   IRRITABLE BOWEL SYNDROME 04/22/2010   Sleep apnea 04/22/2010   LIVER FUNCTION TESTS, ABNORMAL, HX OF 04/22/2010   Generalized anxiety disorder 04/22/2010   Hyperlipidemia 12/03/2009   Essential hypertension 12/03/2009   NASH (nonalcoholic steatohepatitis) 12/03/2009   CHEST PAIN UNSPECIFIED 12/03/2009    History obtained from: chart  review and patient.  [name] is a 51 y.o. female presenting for a follow up visit.  We last saw her in March 2023.  At that time, we documented the failure of [place] to try to get Dupixent approved.  We added on doxepin at night which we warned causes sleepiness.  We continue with Xyzal as well as Pepcid twice a day.  She was given samples of the Dupixent at the last visit which seem to be helping.  Her quality of life had improved as well.  She did have patch testing done in the past and this was all negative although she contracted COVID-19 and missed some of the reading visits.  Since last visit, her insurance denied Dupixent again.  She was off of it for a period of 2 months and then was finally approved for patient assistance with free drug through the company.  Skin Symptom History: She was taken off of the doxepin around [facility]. She was having "spells". These have continued despite stopping the doxepin.  She has had a thorough work-up for the spells including a cardiac and neurological work-up. Regardless, her skin has completely flared up again despite restarting the [place].  It is much more erythematous and this rash is spreading of her bilateral arms and legs.  She is interested in trying anything else to help with this.  She is surprised that the addition of the [place] again did not have the same effect as it did the first time around.  She remains on her Xyzal and Pepcid twice daily.  She is not having any problems with those.  She is unsure whether the doxepin helps, but she has no longer taking it as discussed above anyway.  She has no history of tuberculosis.  She does have a history of hypercholesterolemia and she is on Lipitor for this.  She has clearly had a clear cardiac work-up.  Otherwise, there have been no changes to her past medical history, surgical history, family history, or social history.    Review of Systems  Constitutional: Negative.  Negative for chills,  fever, malaise/fatigue and weight loss.  HENT: Negative.  Negative for congestion, ear discharge and ear pain.   Eyes:  Negative for pain, discharge and redness.  Respiratory:  Negative for cough, sputum production, shortness of breath and wheezing.   Cardiovascular: Negative.  Negative for chest pain and palpitations.  Gastrointestinal:  Negative for abdominal pain, constipation, diarrhea, heartburn, nausea and vomiting.  Skin:  Positive for itching and rash.  Neurological:  Negative for dizziness and headaches.  Endo/Heme/Allergies:  Negative for environmental allergies. Does not bruise/bleed easily.       Objective:   Blood pressure 130/86, pulse (!) 106, temperature 97.8 F (36.6 C), resp. rate 16, height 5' 5"  (1.651 m), weight 215 lb 2 oz (97.6 kg), SpO2 97 %. Body mass index is 35.8 kg/m.    Physical Exam Constitutional:      Appearance: She is well-developed.  HENT:     Head: Normocephalic and atraumatic.     Right  Ear: Tympanic membrane, ear canal and external ear normal.     Left Ear: Tympanic membrane, ear canal and external ear normal.     Nose: No nasal deformity, septal deviation, mucosal edema or rhinorrhea.     Right Turbinates: Not enlarged, swollen or pale.     Left Turbinates: Not enlarged, swollen or pale.     Right Sinus: No maxillary sinus tenderness or frontal sinus tenderness.     Left Sinus: No maxillary sinus tenderness or frontal sinus tenderness.     Mouth/Throat:     Mouth: Mucous membranes are not pale and not dry.     Pharynx: Uvula midline.  Eyes:     General: Lids are normal. No allergic shiner.       Right eye: No discharge.        Left eye: No discharge.     Conjunctiva/sclera: Conjunctivae normal.     Right eye: Right conjunctiva is not injected. No chemosis.    Left eye: Left conjunctiva is not injected. No chemosis.    Pupils: Pupils are equal, round, and reactive to light.  Cardiovascular:     Rate and Rhythm: Normal rate and  regular rhythm.     Heart sounds: Normal heart sounds.  Pulmonary:     Effort: Pulmonary effort is normal. No tachypnea, accessory muscle usage or respiratory distress.     Breath sounds: Normal breath sounds. No wheezing, rhonchi or rales.  Chest:     Chest wall: No tenderness.  Lymphadenopathy:     Cervical: No cervical adenopathy.  Skin:    General: Skin is warm.     Capillary Refill: Capillary refill takes less than 2 seconds.     Coloration: Skin is not pale.     Findings: Rash present. No abrasion or erythema. Rash is not papular, urticarial or vesicular.     Comments: Her rash is flared up again.  It is on her bilateral armsAnd is quite erythematous there, Almost like a sunburn.  She also has a rash on her bilateral legs and it spreading up to her thighs.  She has a lot of excoriations present.  Neurological:     Mental Status: She is alert.  Psychiatric:        Behavior: Behavior is cooperative.      Diagnostic studies: labs sent instead       Salvatore Marvel, MD  Allergy and [place] [facility] [place]

## 2022-02-02 ENCOUNTER — Encounter: Payer: Self-pay | Admitting: Allergy & Immunology

## 2022-02-04 LAB — CBC WITH DIFFERENTIAL
Basophils Absolute: 0 10*3/uL (ref 0.0–0.2)
Basos: 0 %
EOS (ABSOLUTE): 0.2 10*3/uL (ref 0.0–0.4)
Eos: 2 %
Hematocrit: 44.2 % (ref 34.0–46.6)
Hemoglobin: 14.6 g/dL (ref 11.1–15.9)
Immature Grans (Abs): 0 10*3/uL (ref 0.0–0.1)
Immature Granulocytes: 0 %
Lymphocytes Absolute: 2.8 10*3/uL (ref 0.7–3.1)
Lymphs: 32 %
MCH: 31.8 pg (ref 26.6–33.0)
MCHC: 33 g/dL (ref 31.5–35.7)
MCV: 96 fL (ref 79–97)
Monocytes Absolute: 1 10*3/uL — ABNORMAL HIGH (ref 0.1–0.9)
Monocytes: 11 %
Neutrophils Absolute: 4.8 10*3/uL (ref 1.4–7.0)
Neutrophils: 55 %
RBC: 4.59 x10E6/uL (ref 3.77–5.28)
RDW: 12.1 % (ref 11.7–15.4)
WBC: 8.7 10*3/uL (ref 3.4–10.8)

## 2022-02-04 LAB — ALLERGEN PROFILE, BASIC FOOD
Allergen Corn, IgE: <0.1 kU/L
Beef IgE: <0.1 kU/L
Chocolate/Cacao IgE: <0.1 kU/L
Egg, Whole IgE: <0.1 kU/L
Food Mix (Seafoods) IgE: NEGATIVE
Milk IgE: <0.1 kU/L
Peanut IgE: <0.1 kU/L
Pork IgE: <0.1 kU/L
Soybean IgE: <0.1 kU/L
Wheat IgE: <0.1 kU/L

## 2022-02-04 LAB — LIPID PANEL
Chol/HDL Ratio: 7.1 ratio — ABNORMAL HIGH (ref 0.0–4.4)
Cholesterol, Total: 226 mg/dL — ABNORMAL HIGH (ref 100–199)
HDL: 32 mg/dL — ABNORMAL LOW (ref >39)
LDL Chol Calc (NIH): 99 mg/dL (ref 0–99)
Triglycerides: 560 mg/dL (ref 0–149)
VLDL Cholesterol Cal: 95 mg/dL — ABNORMAL HIGH (ref 5–40)

## 2022-02-04 LAB — QUANTIFERON-TB GOLD PLUS
QuantiFERON Mitogen Value: >10 IU/mL
QuantiFERON Nil Value: 0.05 IU/mL
QuantiFERON TB1 Ag Value: 0.13 IU/mL
QuantiFERON TB2 Ag Value: 0.08 IU/mL
QuantiFERON-TB Gold Plus: NEGATIVE

## 2022-02-07 NOTE — Telephone Encounter (Signed)
Patient Advocate Encounter  Received notification from [employer] that the request for prior authorization for VIBERZI has been denied due to off label use/ step therapy requirements. Denial letter will be attached to patient chart.    Clista Bernhardt, CPhT Pharmacy Patient Advocate Specialist [place] Patient Advocate Team Direct Number: [phone]   Fax: [phone]

## 2022-02-09 ENCOUNTER — Other Ambulatory Visit: Payer: Self-pay | Admitting: *Deleted

## 2022-02-09 DIAGNOSIS — R748 Abnormal levels of other serum enzymes: Secondary | ICD-10-CM

## 2022-02-09 DIAGNOSIS — E785 Hyperlipidemia, unspecified: Secondary | ICD-10-CM

## 2022-02-09 DIAGNOSIS — I10 Essential (primary) hypertension: Secondary | ICD-10-CM

## 2022-02-09 DIAGNOSIS — E119 Type 2 diabetes mellitus without complications: Secondary | ICD-10-CM

## 2022-02-10 ENCOUNTER — Other Ambulatory Visit (HOSPITAL_COMMUNITY): Payer: Self-pay

## 2022-02-21 ENCOUNTER — Other Ambulatory Visit (HOSPITAL_COMMUNITY): Payer: Self-pay

## 2022-02-25 ENCOUNTER — Encounter: Payer: Self-pay | Admitting: *Deleted

## 2022-03-02 ENCOUNTER — Encounter (INDEPENDENT_AMBULATORY_CARE_PROVIDER_SITE_OTHER): Payer: Self-pay

## 2022-03-11 ENCOUNTER — Ambulatory Visit: Payer: 59 | Admitting: Internal Medicine

## 2022-03-14 ENCOUNTER — Ambulatory Visit (INDEPENDENT_AMBULATORY_CARE_PROVIDER_SITE_OTHER): Payer: 59 | Admitting: Family Medicine

## 2022-03-14 VITALS — BP 152/103 | HR 85 | Temp 97.5°F | Ht 65.0 in | Wt 211.0 lb

## 2022-03-14 DIAGNOSIS — F1020 Alcohol dependence, uncomplicated: Secondary | ICD-10-CM | POA: Diagnosis not present

## 2022-03-14 MED ORDER — ACAMPROSATE CALCIUM 333 MG PO TBEC: 666.0000 mg | DELAYED_RELEASE_TABLET | Freq: Three times a day (TID) | ORAL | 3 refills | Status: DC

## 2022-03-14 MED ORDER — DIAZEPAM 10 MG PO TABS: ORAL_TABLET | ORAL | 0 refills | Status: DC

## 2022-03-14 NOTE — Progress Notes (Signed)
   Subjective:    Patient ID: [name], female    DOB: 09-Jun-1971, 51 y.o.   MRN: [id number]  HPI  Follow up for syncopy , follows up with neurology  Requests to talk about alcoholism I did fill out a AUDIT questionnaire She admits to several years of misuse but states more so recently does state intermittent blackouts also finds her self feeling rundown at times states she is try to quit on her own had a hard time she is determined to quit now she needs she knows she needs to do so for her health she states her husband is on board she does not want to go to inpatient treatment  We did discuss the warning signs of withdrawal Review of Systems     Objective:   Physical Exam  General-in no acute distress Eyes-no discharge Lungs-respiratory rate normal, CTA CV-no murmurs,RRR Extremities skin warm dry no edema Neuro grossly normal Behavior normal, alert No tremors are noted no shakes are noted      Assessment & Plan:  Alcohol use disorder  Patient does not feel herself to be in any type of withdrawal currently She states she drinks every single night 3-4 drinks sometimes more When she is gone without more than a couple days she does find yourself feeling nauseous and nervous and wanting to have a drink  She is at significant risk for having withdrawals We did discuss this we also discussed inpatient therapy she does not want to do any type of inpatient detox currently  We did discuss use of Valium to help recommend 10 mg tablet Take 1 this evening at home do not drink.  Patient states that she does not want to do anymore drinking.  She may take another half later this evening or a whole depending on how the first 1 affecting her  Starting tomorrow would be 1 tablet 4 times a day for the first day Then 1 tablet 3 times a day for the second day 1 twice daily for the third day Then Klonopin Currently she does 1 in the morning and does not take 1 later in the day She  can take 1 in the morning and a half later in the day-she can take a whole 1 in the evening if necessary  I did instruct her that if the value makes her feel drowsy to reduce the dose of half  Campral 333 mg 2 taken 3 times daily  Follow-up in 10 to 14 days  I did talk with the patient she was able to take her first dose around 6 PM this evening instructed that if she is still awake around midnight she can take half a tablet then starting tomorrow it would be following directions about Valium She was cautioned not to drive with the medication We will check in on her tomorrow She will be referred for substance abuse counseling

## 2022-03-14 NOTE — Patient Instructions (Signed)
Hi Akeya First of all thank you for coming It takes a lot of courage  For today I would take a diazepam 10 mg when you pick up the prescription plus also either half or whole 1 later this evening  On Tuesday I would recommend 1 every 6 hours maximum 4 for tomorrow On Wednesday I would recommend 1 every 8 hours maximum 3 for Wednesday On Thursday I would recommend 1 twice daily-every 12 hours maximum 2 Starting on Friday I would recommend Klonopin 1 tablet in the morning and 1/2 tablet in the evening  Please watch for any signs of withdrawals if you start having a lot of tremors shakes nausea vomiting and feeling extremely restless you may have to go to the emergency department  As for campral 333 mg, take 2 capsules 3 times daily to minimize the desire for alcohol  Needs follow-up here within 10 to 14 days Call us if any problems Thanks-Dr. Nicki Reaper

## 2022-03-15 ENCOUNTER — Other Ambulatory Visit: Payer: Self-pay | Admitting: *Deleted

## 2022-03-15 ENCOUNTER — Encounter: Payer: Self-pay | Admitting: Allergy & Immunology

## 2022-03-15 ENCOUNTER — Ambulatory Visit: Payer: 59 | Admitting: Allergy & Immunology

## 2022-03-15 VITALS — BP 142/88 | HR 105 | Temp 97.9°F | Resp 2 | Ht 65.0 in | Wt 211.6 lb

## 2022-03-15 DIAGNOSIS — F1020 Alcohol dependence, uncomplicated: Secondary | ICD-10-CM

## 2022-03-15 DIAGNOSIS — L299 Pruritus, unspecified: Secondary | ICD-10-CM

## 2022-03-15 DIAGNOSIS — L235 Allergic contact dermatitis due to other chemical products: Secondary | ICD-10-CM | POA: Diagnosis not present

## 2022-03-15 DIAGNOSIS — L209 Atopic dermatitis, unspecified: Secondary | ICD-10-CM

## 2022-03-15 DIAGNOSIS — R21 Rash and other nonspecific skin eruption: Secondary | ICD-10-CM | POA: Diagnosis not present

## 2022-03-15 DIAGNOSIS — Z5181 Encounter for therapeutic drug level monitoring: Secondary | ICD-10-CM | POA: Diagnosis not present

## 2022-03-15 LAB — BASIC METABOLIC PANEL
BUN/Creatinine Ratio: 12 (ref 9–23)
BUN: 11 mg/dL (ref 6–24)
CO2: 22 mmol/L (ref 20–29)
Calcium: 9.6 mg/dL (ref 8.7–10.2)
Chloride: 96 mmol/L (ref 96–106)
Creatinine, Ser: 0.92 mg/dL (ref 0.57–1.00)
Glucose: 97 mg/dL (ref 70–99)
Potassium: 4.1 mmol/L (ref 3.5–5.2)
Sodium: 138 mmol/L (ref 134–144)
eGFR: 75 mL/min/{1.73_m2} (ref >59)

## 2022-03-15 LAB — HEMOGLOBIN A1C
Est. average glucose Bld gHb Est-mCnc: 146 mg/dL
Hgb A1c MFr Bld: 6.7 % — ABNORMAL HIGH (ref 4.8–5.6)

## 2022-03-15 LAB — SPECIMEN STATUS REPORT

## 2022-03-15 LAB — LIPID PANEL
Chol/HDL Ratio: 5 ratio — ABNORMAL HIGH (ref 0.0–4.4)
Cholesterol, Total: 250 mg/dL — ABNORMAL HIGH (ref 100–199)
HDL: 50 mg/dL (ref >39)
LDL Chol Calc (NIH): 100 mg/dL — ABNORMAL HIGH (ref 0–99)
Triglycerides: 589 mg/dL (ref 0–149)
VLDL Cholesterol Cal: 100 mg/dL — ABNORMAL HIGH (ref 5–40)

## 2022-03-15 NOTE — Progress Notes (Unsigned)
FOLLOW UP  Date of Service/Encounter:  03/15/22   Assessment:   Exercise-induced bronchospasm - with normal spirometry    Rash - looks consistent with contact dermatitis with some areas of eczema, previously improving on Dupixent  Plan/Recommendations:    There are no Patient Instructions on file for this visit.   Subjective:   [name] is a 51 y.o. female presenting today for follow up of No chief complaint on file.   [name] has a history of the following: Patient Active Problem List   Diagnosis Date Noted   Syncope 01/19/2022   Family history of colon cancer    Chronic diarrhea    Benign neoplasm of ascending colon    Benign neoplasm of transverse colon    Benign neoplasm of sigmoid colon    Allergic drug reaction 10/09/2020   Pruritic condition 10/09/2020   Vitamin D deficiency 05/20/2020   Hypertension    Spastic bladder    Prediabetes 33/00/7622   Diastolic dysfunction 63/33/5456   DDD (degenerative disc disease), lumbar 11/13/2019   Spinal stenosis of lumbar region 09/20/2019   Numbness of hand 05/20/2019   Acquired hypothyroidism 07/13/2017   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   Gluten-sensitive enteropathy 07/13/2017   Lactose intolerance in adult 07/13/2017   History of decompression of median nerve 02/17/2017   Carpal tunnel syndrome 12/21/2016   Pseudoarthrosis of cervical spine ([place]) 07/04/2016   History of arthrodesis 08/25/2015   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 07/16/2015   Spinal cord disease ([place]) 07/10/2015   Cervical spondylosis with myelopathy 07/10/2015   DDD (degenerative disc disease), cervical 07/10/2015   Fibromyalgia 07/07/2015   Lumbar spondylosis 03/05/2014   Degeneration of lumbar intervertebral disc 03/05/2014   Paresthesia of lower extremity 02/04/2014   Obesity, unspecified 05/09/2013   Bilateral renal cysts 05/09/2013   Dyspareunia  02/25/2013   Mild intermittent asthma without complication 25/63/8937   Hyperglycemia 11/07/2012   Other malaise and fatigue 11/07/2012   Anxiety state 04/22/2010   GERD 04/22/2010   ILEUS 04/22/2010   IRRITABLE BOWEL SYNDROME 04/22/2010   Sleep apnea 04/22/2010   LIVER FUNCTION TESTS, ABNORMAL, HX OF 04/22/2010   Generalized anxiety disorder 04/22/2010   Hyperlipidemia 12/03/2009   Essential hypertension 12/03/2009   NASH (nonalcoholic steatohepatitis) 12/03/2009   CHEST PAIN UNSPECIFIED 12/03/2009    History obtained from: chart review and {Persons; PED relatives w/patient:19415::"patient"}.  [name] is a 51 y.o. female presenting for {Blank single:19197::"a food challenge","a drug challenge","skin testing","a sick visit","an evaluation of ***","a follow up visit"}.  She was last seen in July 2023.  At that time, her rash had worsened despite being back on Dupixent.   Since the last visit, she has done very well.   She is on cholesterol medications. Triglycerides are very high. She has had an echocardiogram and ultrasound and heart monitor. This is completely normal.   She remains interested in figuring out what is causing this. She is on 15 mg once daily.   {Blank single:19197::"Asthma/Respiratory Symptom History: ***"," "}  {Blank single:19197::"Allergic Rhinitis Symptom History: ***"," "}  {Blank single:19197::"Food Allergy Symptom History: ***"," "}  {Blank single:19197::"Skin Symptom History: ***"," "}  {Blank single:19197::"GERD Symptom History: ***"," "}  Otherwise, there have been no changes to her past medical history, surgical history, family history, or social history.    ROS     Objective:   There were no vitals taken for this visit. There is no height or weight on file  to calculate BMI.    Physical Exam   Diagnostic studies: {Blank single:19197::"none","deferred due to recent antihistamine use","labs sent instead"," "}  Spirometry: {Blank  single:19197::"results normal (FEV1: ***%, FVC: ***%, FEV1/FVC: ***%)","results abnormal (FEV1: ***%, FVC: ***%, FEV1/FVC: ***%)"}.    {Blank single:19197::"Spirometry consistent with mild obstructive disease","Spirometry consistent with moderate obstructive disease","Spirometry consistent with severe obstructive disease","Spirometry consistent with possible restrictive disease","Spirometry consistent with mixed obstructive and restrictive disease","Spirometry uninterpretable due to technique","Spirometry consistent with normal pattern"}. {Blank single:19197::"Albuterol/Atrovent nebulizer","Xopenex/Atrovent nebulizer","Albuterol nebulizer","Albuterol four puffs via MDI","Xopenex four puffs via MDI"} treatment given in clinic with {Blank single:19197::"significant improvement in FEV1 per ATS criteria","significant improvement in FVC per ATS criteria","significant improvement in FEV1 and FVC per ATS criteria","improvement in FEV1, but not significant per ATS criteria","improvement in FVC, but not significant per ATS criteria","improvement in FEV1 and FVC, but not significant per ATS criteria","no improvement"}.  Allergy Studies: {Blank single:19197::"none","labs sent instead"," "}    {Blank single:19197::"Allergy testing results were read and interpreted by myself, documented by clinical staff."," "}      Salvatore Marvel, MD  Allergy and [facility] [facility]

## 2022-03-15 NOTE — Patient Instructions (Addendum)
1. Rash - [name] is getting the lipids under control. - We are going to send in the Rinvoq and getting Tammy to work on the approval. - I am glad that the cardiac workup has been normal.  - Continue with Xyzal 96m twice daily. - Continue with Pepcid (famotidine) 278mtwice daily - Continue with Dupixent as well (the Rinvoq might just be a bridge until DuSallistarts working again). - [facility]  2. Return in about 2 months (around 05/15/2022) for [place]   Please inform usKoreaf any Emergency Department visits, hospitalizations, or changes in symptoms. Call usKoreaefore going to the ED for breathing or allergy symptoms since we might be able to fit you in for a sick visit. Feel free to contact usKoreanytime with any questions, problems, or concerns.  It was a pleasure to see you again as well as your MOM!!!   Websites that have reliable patient information: 1. American Academy of Asthma, Allergy, and Immunology: www.aaaai.org 2. Food Allergy Research and Education (FARE): foodallergy.org 3. Mothers of Asthmatics: http://www.asthmacommunitynetwork.org 4. American College of Allergy, Asthma, and Immunology: www.acaai.org   COVID-19 Vaccine Information can be found at: [email or web address] questions related to vaccine distribution or appointments, please email [email or web address] or call [phone]  We realize that you might be concerned about having an allergic reaction to the COVID19 vaccines. To help with that concern, WE ARE OFFERING THE COVID19 VACCINES IN OUR OFFICE! Ask the front desk for dates!     "Like" usKorean Facebook and Instagram for our latest updates!      A healthy democracy works best when ALNew York Life Insurancearticipate! Make sure you are registered to vote! If you have moved or changed any of your contact information, you will need to  get this updated before voting!  In some cases, you MAY be able to register to vote online: [email or web address]

## 2022-03-17 ENCOUNTER — Encounter: Payer: Self-pay | Admitting: Allergy & Immunology

## 2022-03-18 LAB — HEPATIC FUNCTION PANEL
ALT: 65 IU/L — ABNORMAL HIGH (ref 0–32)
AST: 62 IU/L — ABNORMAL HIGH (ref 0–40)
Albumin: 4.6 g/dL (ref 3.8–4.9)
Alkaline Phosphatase: 108 IU/L (ref 44–121)
Bilirubin Total: 0.6 mg/dL (ref 0.0–1.2)
Bilirubin, Direct: 0.17 mg/dL (ref 0.00–0.40)
Total Protein: 7.5 g/dL (ref 6.0–8.5)

## 2022-03-18 LAB — SPECIMEN STATUS REPORT

## 2022-03-21 ENCOUNTER — Encounter: Payer: Self-pay | Admitting: Family Medicine

## 2022-03-21 ENCOUNTER — Telehealth: Payer: Self-pay | Admitting: *Deleted

## 2022-03-21 NOTE — Telephone Encounter (Signed)
Patient Ins denied Rinvoq due to not meeting criteria. Has to try and fail 2 non biologic alternatives Methotrexate, Cyclosporin, Azathioprine, etc. Pleas advise

## 2022-03-21 NOTE — Telephone Encounter (Signed)
-----   Message from Valentina Shaggy, MD sent at 03/17/2022  5:38 AM EDT ----- Can we get Rinvoq 15 mg approved for her? I am hoping that this is just two months while the [place] starts to work again.

## 2022-03-23 NOTE — Telephone Encounter (Signed)
OK let's have her just complete the Rinvoq samples that she has and transition to [place] only via [place] [address].  Salvatore Marvel, MD Allergy and [place] [facility]

## 2022-03-23 NOTE — Telephone Encounter (Signed)
L/m for patient to contact me to advise denial and Dr Ernst Bowler instructions

## 2022-03-30 ENCOUNTER — Ambulatory Visit (INDEPENDENT_AMBULATORY_CARE_PROVIDER_SITE_OTHER): Payer: 59 | Admitting: Family Medicine

## 2022-03-30 VITALS — BP 123/85 | HR 87 | Temp 98.0°F | Wt 207.0 lb

## 2022-03-30 DIAGNOSIS — F1020 Alcohol dependence, uncomplicated: Secondary | ICD-10-CM

## 2022-03-30 DIAGNOSIS — R109 Unspecified abdominal pain: Secondary | ICD-10-CM | POA: Diagnosis not present

## 2022-03-30 LAB — POCT URINALYSIS DIP (CLINITEK)
Bilirubin, UA: NEGATIVE
Blood, UA: NEGATIVE
Glucose, UA: NEGATIVE mg/dL
Ketones, POC UA: NEGATIVE mg/dL
Leukocytes, UA: NEGATIVE
Nitrite, UA: NEGATIVE
POC PROTEIN,UA: NEGATIVE
Spec Grav, UA: <=1.005 — AB (ref 1.010–1.025)
Urobilinogen, UA: 0.2 E.U./dL
pH, UA: 6 (ref 5.0–8.0)

## 2022-03-30 NOTE — Progress Notes (Signed)
   Subjective:    Patient ID: [name], female    DOB: 02/25/1971, 51 y.o.   MRN: [id number]  HPI Follow up for LOC  Right flank pain  Patient had loss of consciousness had work-up by cardiology and neurology which were negative She does relate some blackout spells related to alcohol On last visit we did taper down on benzodiazepines to avoid withdrawals and we started Campral she is doing well with all of this she states she has not had any alcohol over the past couple weeks  Review of Systems     Objective:   Physical Exam  Lungs clear heart regular pulse normal      Assessment & Plan:  Very important for her to stay away from alcohol to lessen liver damage I encouraged her to find other hobbies to replace alcohol She will have counseling coming up for behavioral health issues which should be helpful for her Continue Campral Follow-up again in December do lab work before that time  She wonders if down the road she can have an occasional drink-I have encouraged her to not focusing on that currently to lessen any backsliding

## 2022-03-31 ENCOUNTER — Ambulatory Visit: Payer: 59 | Admitting: Allergy & Immunology

## 2022-04-02 ENCOUNTER — Other Ambulatory Visit: Payer: Self-pay | Admitting: Family Medicine

## 2022-04-04 NOTE — Telephone Encounter (Signed)
Spoke to patient and advised we cannot get Ins approval for [place] and we will try to keep her in samples for 2 months and then hopefully her Dupixent will kick in and control her skin better

## 2022-04-11 ENCOUNTER — Other Ambulatory Visit (HOSPITAL_COMMUNITY): Payer: Self-pay

## 2022-04-12 ENCOUNTER — Telehealth: Payer: Self-pay | Admitting: Pharmacy Technician

## 2022-04-12 NOTE — Telephone Encounter (Signed)
What "step" therapy are they requiring we try 1st before approval of Viberzi??

## 2022-04-14 ENCOUNTER — Other Ambulatory Visit (HOSPITAL_COMMUNITY): Payer: Self-pay

## 2022-04-14 NOTE — Telephone Encounter (Signed)
According to the denial letter from 7.17.23: "If the patient has functional diarrhea, then please consider treatment with Diphenoxylate-Atropine or Bismuth Subsalicylate." Lomotil returns a copay of $0, and the other is Pepto-Bismol which they would need to try over the counter.  "If the patient has a documented diagnosis of IBS with predominant diarrhea confirmed by Rome IV criteria, then step therapy with Dicyclomine, Xifaxan, and Alosetron (in order) is required (prior authorization may be required)." Dicyclomine returns a copay of $0, Xifaxan requires a PA, and Alosetron requires a PA.

## 2022-04-18 NOTE — Telephone Encounter (Signed)
[name] we have had much trouble getting medications approved for her.  Viberzi has been denied and less she has tried "step" therapy. She has previously tried rifaximin She has tried loperamide  I would recommend that she try dicyclomine 20 mg 3 times daily --please explained to her that this is required before another IBS-diarrhea medication can be approved by her insurance  She should try this for 7 to 10 days and then be back in touch with [place] and let me know if it is working Either way she needs to let me know in 7 to 10 days so that we can go to the next step  Explain to her this is all required per her insurance; I apologize that her insurance company is making it difficult for [place] to care for her with the medications I would prefer.

## 2022-04-19 MED ORDER — DICYCLOMINE HCL 20 MG PO TABS: 20.0000 mg | ORAL_TABLET | Freq: Three times a day (TID) | ORAL | 0 refills | Status: DC

## 2022-04-19 NOTE — Telephone Encounter (Signed)
Spoke with pt and she is aware of Dr. Quentin Mulling comments and recommendations. Script sent to pharmacy and she knows to contact us back with an update in 7-10 days.

## 2022-04-28 ENCOUNTER — Other Ambulatory Visit (HOSPITAL_COMMUNITY): Payer: Self-pay

## 2022-05-02 ENCOUNTER — Telehealth: Payer: Self-pay | Admitting: Obstetrics & Gynecology

## 2022-05-02 ENCOUNTER — Other Ambulatory Visit (HOSPITAL_COMMUNITY): Payer: Self-pay

## 2022-05-02 NOTE — Telephone Encounter (Signed)
Pt is requesting a prior authorizatioMYRBETRIQ 50 MG TB24 tablet n on sent to [facility]

## 2022-05-04 LAB — HM MAMMOGRAPHY

## 2022-05-05 ENCOUNTER — Other Ambulatory Visit: Payer: Self-pay | Admitting: Obstetrics & Gynecology

## 2022-05-09 ENCOUNTER — Other Ambulatory Visit: Payer: Self-pay | Admitting: Family Medicine

## 2022-05-12 ENCOUNTER — Other Ambulatory Visit: Payer: Self-pay | Admitting: Internal Medicine

## 2022-05-14 ENCOUNTER — Encounter: Payer: Self-pay | Admitting: Allergy & Immunology

## 2022-05-16 ENCOUNTER — Ambulatory Visit: Payer: 59 | Admitting: Internal Medicine

## 2022-05-16 VITALS — BP 124/84 | HR 74 | Temp 97.3°F | Resp 18

## 2022-05-16 DIAGNOSIS — L2389 Allergic contact dermatitis due to other agents: Secondary | ICD-10-CM | POA: Diagnosis not present

## 2022-05-16 NOTE — Progress Notes (Signed)
    Follow-up Note  RE: [name] MRN: [id number] DOB: [birth year] Date of Office Visit: 05/16/2022  Primary care provider: Kathyrn Drown, MD Referring provider: Kathyrn Drown, MD   [name] returns to the office today for the patch test placement, given suspected history of contact dermatitis.    Diagnostics: True Test patches placed.    Plan:   Allergic contact dermatitis - Instructions provided on care of the patches for the next 48 hours. - [name] was instructed to avoid showering for the next 48 hours. - [name] will follow up in 48 hours and 96 hours for patch readings.    Roney Marion  MD [facility]

## 2022-05-18 ENCOUNTER — Encounter: Payer: 59 | Admitting: Internal Medicine

## 2022-05-18 ENCOUNTER — Ambulatory Visit: Payer: 59 | Admitting: Internal Medicine

## 2022-05-18 DIAGNOSIS — L2389 Allergic contact dermatitis due to other agents: Secondary | ICD-10-CM

## 2022-05-18 NOTE — Progress Notes (Signed)
   Follow Up Note  RE: [name] MRN: [id number] DOB: 02/13/1971 Date of Office Visit: 05/20/2022  Referring provider: Kathyrn Drown, MD Primary care provider: Kathyrn Drown, MD  History of Present Illness: I had the pleasure of seeing [name] for a follow up visit at the [facility] on 05/20/2022. She is a 51 y.o. female, who is being followed for concern for contact dermatitis. Today she is here for final patch test interpretation, given suspected history of contact dermatitis.   Diagnostics:  TRUE TEST 96 hour reading:   T.R.U.E. Test - 05/20/22 1500       Test Information   Time Antigen Placed 1155    Manufacturer Greer    Location Back    Number of Test 36    Reading Interval Day 1   Patches placed   Panel Panel 1;Panel 2;Panel 3      Panel 1   1. Nickel Sulfate 0    2. Wool Alcohols 0    3. Neomycin Sulfate 0    4. Potassium Dichromate 0    5. Caine Mix 0    6. Fragrance Mix 0    7. Colophony 0    8. Paraben Mix 0    9. Negative Control 0    10. Balsam of [place] 0    11. Ethylenediamine Dihydrochloride 0    12. Cobalt Dichloride 0      Panel 2   13. p-tert Butylphenol Formaldehyde Resin --   +/-   14. Epoxy Resin 0    15. Carba Mix 0    16.  Black Rubber Mix 0    17. Cl+ Me-Isothiazolinone 2    18. Quaternium-15 0    19. Methyldibromo Glutaronitrile 0    20. p-Phenylenediamine 0    21. Formaldehyde 0    22. Mercapto Mix 0    23. Thimerosal 0    24. Thiuram Mix 0      Panel 3   25. Diazolidinyl Urea 0    26. Quinoline Mix 0    27. Tixocortol-21-Pivalate 0    28. Gold Sodium Thiosulfate 1    29. Imidazolidinyl Urea 0    30. Budesonide 0    31. Hydrocortisone-17-Butyrate 0    32. Mercaptobenzothiazole 0    33. Bacitracin 0    34. Parthenolide 0    35. Disperse Blue 106 0    36. 2-Bromo-2-Nitropropane-1,3-diol 0              Assessment and Plan: [name] is a 51 y.o. female with:  Concern for Contact  Dermatitis:  The patient has been provided detailed information regarding the substances she is sensitive to, as well as products containing the substances.  Meticulous avoidance of these substances is recommended. If avoidance is not possible, the use of barrier creams or lotions is recommended. If symptoms persist or progress despite meticulous avoidance of chemicals/substances above, dermatology evaluation may be warranted. Patient email: [email or web address]  It was my pleasure to see [name] today and participate in her care. Please feel free to contact me with any questions or concerns.  Sincerely,   Sigurd Sos, MD [facility]

## 2022-05-18 NOTE — Progress Notes (Signed)
   Follow Up Note  RE: DEWANDA FENNEMA MRN: [id number] DOB: 25-May-1971 Date of Office Visit: 05/18/2022  Referring provider: Kathyrn Drown, MD Primary care provider: Kathyrn Drown, MD  History of Present Illness: I had the pleasure of seeing Yazmyn Valbuena for a follow up visit at the [facility] on 05/18/2022. She is a 51 y.o. female, who is being followed for allergic contact dermatitis. Today she is here for initial patch test interpretation, given suspected history of contact dermatitis.   Diagnostics:  TRUE TEST 48 hour reading:   T.R.U.E. Test - 05/18/22 1000       Panel 2   17. Cl+ Me-Isothiazolinone 2 (P)               Assessment and Plan: Zuzu is a 51 y.o. female with: Concern for Contact Dermatitis:  Positive to number 17: Cl+Me-Isothiazolinone   The patient has been provided detailed information regarding the substances she is sensitive to, as well as products containing the substances.  Meticulous avoidance of these substances is recommended. If avoidance is not possible, the use of barrier creams or lotions is recommended. If symptoms persist or progress despite meticulous avoidance of chemicals/substances above, dermatology evaluation may be warranted. No follow-ups on file.  It was my pleasure to see Adyn today and participate in her care. Please feel free to contact me with any questions or concerns.  Sincerely,   Roney Marion, MD [facility]

## 2022-05-20 ENCOUNTER — Ambulatory Visit: Payer: 59 | Admitting: Internal Medicine

## 2022-05-20 ENCOUNTER — Other Ambulatory Visit (HOSPITAL_COMMUNITY): Payer: Self-pay

## 2022-05-20 ENCOUNTER — Encounter: Payer: 59 | Admitting: Internal Medicine

## 2022-05-20 ENCOUNTER — Encounter: Payer: Self-pay | Admitting: Internal Medicine

## 2022-05-20 DIAGNOSIS — L235 Allergic contact dermatitis due to other chemical products: Secondary | ICD-10-CM | POA: Insufficient documentation

## 2022-05-24 ENCOUNTER — Encounter: Payer: Self-pay | Admitting: Family Medicine

## 2022-05-28 ENCOUNTER — Other Ambulatory Visit: Payer: Self-pay | Admitting: Internal Medicine

## 2022-06-02 ENCOUNTER — Ambulatory Visit (INDEPENDENT_AMBULATORY_CARE_PROVIDER_SITE_OTHER): Payer: 59 | Admitting: Obstetrics & Gynecology

## 2022-06-02 ENCOUNTER — Encounter: Payer: Self-pay | Admitting: Obstetrics & Gynecology

## 2022-06-02 VITALS — BP 111/78 | HR 76 | Ht 65.0 in | Wt 201.0 lb

## 2022-06-02 DIAGNOSIS — Z01419 Encounter for gynecological examination (general) (routine) without abnormal findings: Secondary | ICD-10-CM

## 2022-06-02 DIAGNOSIS — N3941 Urge incontinence: Secondary | ICD-10-CM | POA: Diagnosis not present

## 2022-06-02 MED ORDER — SOLIFENACIN SUCCINATE 10 MG PO TABS: 10.0000 mg | ORAL_TABLET | Freq: Every day | ORAL | 11 refills | Status: DC

## 2022-06-02 NOTE — Progress Notes (Signed)
Subjective:     [name] is a 51 y.o. female here for a routine exam.  No LMP recorded. Patient has had a hysterectomy. G2P2 Birth Control Method:  hysterectomy 2005 Menstrual Calendar(currently): amenorrhea  Current complaints: none, recent concussion 10/2021.   Current acute medical issues:  no acute   Recent Gynecologic History No LMP recorded. Patient has had a hysterectomy. 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)  Past Medical History:  Diagnosis Date   Allergy    Anemia    hx   Anxiety    takes Klonopin daily as needed.takes Celexa daily   Arthritis    left foot and hands   Asthma    exercise induced   Back pain    Bilateral swelling of feet    Complication of anesthesia    slow to wake  up   Diabetes mellitus without complication (HCC)    Diastolic dysfunction 37/0488   per patient    Granuloma annulare    Herpes infection    High cholesterol    takes Pravastatin daily   History of bronchitis    several yrs ago   History of migraine    last one about 2 wks ago   Hypertension    IBS (irritable bowel syndrome)    takes Trulance and ProBiotic Daily   Inflammation of hair follicles    takes Minocycline daily   Insomnia    takes Melatonin nightly   Joint pain    Lactose intolerance    Liver fibrosis    Menopausal state    Migraine    NASH (nonalcoholic steatohepatitis)    Prediabetes 01/01/2020   Sleep apnea    sleep study > 5 yrs ago. no CPAP use    Sleep apnea    Spastic bladder    takes Myrbetriq daily   Tingling    r/t neck. Pseudoarthrosis    Tubular adenoma of colon    UTI (urinary tract infection)    hx of but takes Macrodantin daily. has been on for 2 1/2 yrs     Past Surgical History:  Procedure Laterality Date   ABDOMINAL HYSTERECTOMY  2005   complete hysterectomy both ovaries removed   BIOPSY  10/14/2021   Procedure: BIOPSY;  Surgeon: Jerene Bears, MD;  Location: [facility]  ENDOSCOPY;  Service: Gastroenterology;;   BREAST REDUCTION SURGERY  2009   CARPAL TUNNEL RELEASE     carpel tunnel Bilateral 02/2017, 2021   CERVICAL [place] SURGERY  2007, 2009, 2014, 2017   anterior  x 3 , posterior x 2    CESAREAN SECTION  94/03   x 2   COLONOSCOPY     COLONOSCOPY WITH PROPOFOL N/A 10/14/2021   Procedure: COLONOSCOPY WITH PROPOFOL;  Surgeon: Jerene Bears, MD;  Location: [facility] ENDOSCOPY;  Service: Gastroenterology;  Laterality: N/A;   ESOPHAGOGASTRODUODENOSCOPY     POLYPECTOMY  10/14/2021   Procedure: POLYPECTOMY;  Surgeon: Jerene Bears, MD;  Location: [facility];  Service: Gastroenterology;;   POSTERIOR CERVICAL FUSION/FORAMINOTOMY N/A 01/07/2013   Procedure: POSTERIOR CERVICAL FUSION/FORAMINOTOMY LEVEL 2;  Surgeon: Hosie Spangle, MD;  Location: [facility];  Service: Neurosurgery;  Laterality: N/A;  Posterior Cervical Five-Six/Six-Seven Arthrodesis with Instrumentation   POSTERIOR CERVICAL FUSION/FORAMINOTOMY N/A 07/04/2016   Procedure: CERVICAL THREE-FOUR POSTERIOR CERVICAL ARTHRODESIS;  Surgeon: Jovita Gamma, MD;  Location: [facility];  Service: Neurosurgery;  Laterality: N/A;  C3-C4 POSTERIOR CERVICAL ARTHRODESIS   tummy  tuck     TYMPANOSTOMY TUBE PLACEMENT Bilateral    As a baby   UPPER GASTROINTESTINAL ENDOSCOPY      OB History     Gravida  2   Para  2   Term      Preterm      AB      Living  2      SAB      IAB      Ectopic      Multiple      Live Births              Social History   Socioeconomic History   Marital status: Married    Spouse name: Jimmy   Number of children: 2   Years of education: college   Highest education level: Associate degree: academic program  Occupational History   Occupation: self employed-janitorial  Tobacco Use   Smoking status: Never   Smokeless tobacco: Never  Vaping Use   Vaping Use: Never used  Substance and Sexual Activity   Alcohol use: Yes    Alcohol/week: 1.0 standard drink of alcohol     Types: 1 Glasses of wine per week    Comment: occasional   Drug use: No   Sexual activity: Yes    Birth control/protection: Surgical    Comment: hysterectomy  Other Topics Concern   Not on file  Social History Narrative   Lives with family   Caffeine- 2 daily   Social Determinants of Health   Financial Resource Strain: Low Risk  (06/02/2022)   Overall Financial Resource Strain (CARDIA)    Difficulty of Paying Living Expenses: Not very hard  Food Insecurity: No Food Insecurity (06/02/2022)   Hunger Vital Sign    Worried About Running Out of Food in the Last Year: Never true    Ran Out of Food in the Last Year: Never true  Transportation Needs: No Transportation Needs (06/02/2022)   PRAPARE - Hydrologist (Medical): No    Lack of Transportation (Non-Medical): No  Physical Activity: Insufficiently Active (06/02/2022)   Exercise Vital Sign    Days of Exercise per Week: 2 days    Minutes of Exercise per Session: 30 min  Stress: No Stress Concern Present (06/02/2022)   [place]    Feeling of Stress : Not at all  Social Connections: [place] (06/02/2022)   Social Connection and Isolation Panel [NHANES]    Frequency of Communication with Friends and Family: More than three times a week    Frequency of Social Gatherings with Friends and Family: More than three times a week    Attends Religious Services: More than 4 times per year    Active Member of [employer] or Organizations: Yes    Attends Music therapist: More than 4 times per year    Marital Status: Married    Family History  Problem Relation Age of Onset   Ovarian cancer Mother    Hypertension Mother    Irritable bowel syndrome Mother    Neuropathy Mother    Hyperlipidemia Mother    Cancer Mother    Cirrhosis Father        alcohol abuse   Diabetes Father    Hypertension Father    Alcoholism Father     Hyperlipidemia Father    Liver disease Father    Alcohol abuse Father    Drug abuse Brother  Colon cancer Maternal Grandmother        age unknown-72's   CAD Maternal Grandfather    Colon polyps Neg Hx    Esophageal cancer Neg Hx    Rectal cancer Neg Hx    Stomach cancer Neg Hx      Current Outpatient Medications:    acamprosate (CAMPRAL) 333 MG tablet, TAKE 2 TABLETS (666 MG TOTAL) BY MOUTH 3 (THREE) TIMES DAILY WITH MEALS., Disp: 540 tablet, Rfl: 1   acyclovir (ZOVIRAX) 400 MG tablet, TAKE 1 TABLET BY MOUTH EVERY DAY, Disp: 90 tablet, Rfl: 5   albuterol (VENTOLIN HFA) 108 (90 Base) MCG/ACT inhaler, INHALE 2 PUFFS BY MOUTH EVERY 6 HOURS AS NEEDED FOR WHEEZE, Disp: 18 each, Rfl: 8   Cholecalciferol (VITAMIN D3) 125 MCG (5000 UT) CAPS, Take 25,000 Units by mouth in the morning., Disp: , Rfl:    citalopram (CELEXA) 40 MG tablet, Take 1 tablet (40 mg total) by mouth daily., Disp: 90 tablet, Rfl: 1   clonazePAM (KLONOPIN) 1 MG tablet, 1/2 to 1 bid prn, Disp: 60 tablet, Rfl: 5   Coenzyme Q10 (COQ10 PO), Take 300 mg by mouth in the morning., Disp: , Rfl:    cyclobenzaprine (FLEXERIL) 10 MG tablet, Take 0.5-1 tablets (5-10 mg total) by mouth 3 (three) times daily as needed for muscle spasms., Disp: 50 tablet, Rfl: 0   dicyclomine (BENTYL) 20 MG tablet, TAKE 1 TABLET (20 MG TOTAL) BY MOUTH 3 (THREE) TIMES DAILY BEFORE MEALS., Disp: 60 tablet, Rfl: 0   famotidine (PEPCID) 20 MG tablet, Take 1 tablet (20 mg total) by mouth 2 (two) times daily., Disp: 60 tablet, Rfl: 5   furosemide (LASIX) 20 MG tablet, 1 qam prn, Disp: 90 tablet, Rfl: 1   ibuprofen (ADVIL,MOTRIN) 800 MG tablet, Take 800 mg by mouth every 8 (eight) hours as needed. for pain, Disp: , Rfl: 3   levocetirizine (XYZAL) 5 MG tablet, Take 1 tablet (5 mg total) by mouth 2 (two) times daily., Disp: 60 tablet, Rfl: 5   liothyronine (CYTOMEL) 25 MCG tablet, Take 12.5 mcg by mouth every morning., Disp: , Rfl:    lisinopril (ZESTRIL) 20 MG  tablet, TAKE 1 TABLET BY MOUTH EVERY DAY, Disp: 90 tablet, Rfl: 1   MAGNESIUM-POTASSIUM PO, Take 1 tablet by mouth daily. With Zinc, Disp: , Rfl:    metFORMIN (GLUCOPHAGE-XR) 500 MG 24 hr tablet, Take 1 tablet (500 mg total) by mouth 2 (two) times daily., Disp: 60 tablet, Rfl: 5   NON FORMULARY, 1 Dose by Implant route every 3 (three) months., Disp: , Rfl:    Potassium 99 MG TABS, Take 1 tablet by mouth daily., Disp: , Rfl:    rosuvastatin (CRESTOR) 10 MG tablet, TAKE 1 TABLET BY MOUTH EVERY DAY, Disp: 90 tablet, Rfl: 1   solifenacin (VESICARE) 10 MG tablet, Take 1 tablet (10 mg total) by mouth daily., Disp: 30 tablet, Rfl: 11   TURMERIC PO, Take 1 capsule by mouth in the morning and at bedtime., Disp: , Rfl:    Upadacitinib ER (RINVOQ) 15 MG TB24, Take by mouth., Disp: , Rfl:    valACYclovir (VALTREX) 1000 MG tablet, TAKE 2 TABLETS BY MOUTH NOW THEN 2 TABLETS BY MOUTH 12 HOURS LATER (Patient taking differently: Take 1,000 mg by mouth 2 (two) times daily as needed (outbreaks).), Disp: 4 tablet, Rfl: 4   vitamin E 180 MG (400 UNITS) capsule, Take 400 Units by mouth in the morning and at bedtime., Disp: , Rfl:  Zinc 50 MG TABS, Take 1 tablet by mouth daily., Disp: , Rfl:    ciprofloxacin (CILOXAN) 0.3 % ophthalmic solution, SMARTSIG:In Eye(s) (Patient not taking: Reported on 06/02/2022), Disp: , Rfl:    ciprofloxacin (CIPRO) 500 MG tablet, SMARTSIG:1 Tablet(s) By Mouth Every 12 Hours (Patient not taking: Reported on 06/02/2022), Disp: , Rfl:    Eluxadoline (VIBERZI) 75 MG TABS, Take 1 tablet by mouth 2 (two) times daily. (Patient not taking: Reported on 06/02/2022), Disp: 180 tablet, Rfl: 0   estradiol (ESTRACE) 2 MG tablet, TAKE 1 TABLET BY MOUTH EVERY DAY (Patient not taking: Reported on 06/02/2022), Disp: 90 tablet, Rfl: 4   Krill Oil 500 MG CAPS, Take 500 mg by mouth every evening. (Patient not taking: Reported on 06/02/2022), Disp: , Rfl:    MYRBETRIQ 50 MG TB24 tablet, TAKE 1 TABLET BY MOUTH EVERY  DAY (Patient not taking: Reported on 06/02/2022), Disp: 30 tablet, Rfl: 11  Current Facility-Administered Medications:    dupilumab (DUPIXENT) prefilled syringe 300 mg, 300 mg, Subcutaneous, Q14 Days, Valentina Shaggy, MD, 300 mg at 10/12/21 1126  Review of Systems  Review of Systems  Constitutional: Negative for fever, chills, weight loss, malaise/fatigue and diaphoresis.  HENT: Negative for hearing loss, ear pain, nosebleeds, congestion, sore throat, neck pain, tinnitus and ear discharge.   Eyes: Negative for blurred vision, double vision, photophobia, pain, discharge and redness.  Respiratory: Negative for cough, hemoptysis, sputum production, shortness of breath, wheezing and stridor.   Cardiovascular: Negative for chest pain, palpitations, orthopnea, claudication, leg swelling and PND.  Gastrointestinal: negative for abdominal pain. Negative for heartburn, nausea, vomiting, diarrhea, constipation, blood in stool and melena.  Genitourinary: Negative for dysuria, urgency, frequency, hematuria and flank pain.  Musculoskeletal: Negative for myalgias, back pain, joint pain and falls.  Skin: Negative for itching and rash.  Neurological: Negative for dizziness, tingling, tremors, sensory change, speech change, focal weakness, seizures, loss of consciousness, weakness and headaches.  Endo/Heme/Allergies: Negative for environmental allergies and polydipsia. Does not bruise/bleed easily.  Psychiatric/Behavioral: Negative for depression, suicidal ideas, hallucinations, memory loss and substance abuse. The patient is not nervous/anxious and does not have insomnia.        Objective:  Blood pressure 111/78, pulse 76, height 5' 5"  (1.651 m), weight 201 lb (91.2 kg).   Physical Exam  Vitals reviewed. Constitutional: She is oriented to person, place, and time. She appears well-developed and well-nourished.  HENT:  Head: Normocephalic and atraumatic.        Right Ear: External ear normal.  Left  Ear: External ear normal.  Nose: Nose normal.  Mouth/Throat: Oropharynx is clear and moist.  Eyes: Conjunctivae and EOM are normal. Pupils are equal, round, and reactive to light. Right eye exhibits no discharge. Left eye exhibits no discharge. No scleral icterus.  Neck: Normal range of motion. Neck supple. No tracheal deviation present. No thyromegaly present.  Cardiovascular: Normal rate, regular rhythm, normal heart sounds and intact distal pulses.  Exam reveals no gallop and no friction rub.   No murmur heard. Respiratory: Effort normal and breath sounds normal. No respiratory distress. She has no wheezes. She has no rales. She exhibits no tenderness.  GI: Soft. Bowel sounds are normal. She exhibits no distension and no mass. There is no tenderness. There is no rebound and no guarding.  Genitourinary:  Breasts no masses skin changes or nipple changes bilaterally      Vulva is normal without lesions Vagina is pink moist without discharge Cervix absent and pap is  not done Uterus absent Adnexa is negative  Musculoskeletal: Normal range of motion. She exhibits no edema and no tenderness.  Neurological: She is alert and oriented to person, place, and time. She has normal reflexes. She displays normal reflexes. No cranial nerve deficit. She exhibits normal muscle tone. Coordination normal.  Skin: Skin is warm and dry. No rash noted. No erythema. No pallor.  Psychiatric: She has a normal mood and affect. Her behavior is normal. Judgment and thought content normal.       Medications Ordered at today's visit: Meds ordered this encounter  Medications   solifenacin (VESICARE) 10 MG tablet    Sig: Take 1 tablet (10 mg total) by mouth daily.    Dispense:  30 tablet    Refill:  11    Other orders placed at today's visit: No orders of the defined types were placed in this encounter.     Assessment:    Normal Gyn exam.    Plan:    Hormone replacement therapy: hormone replacement  therapy: estradiol 2 mg . Follow up in: 3 years.     Return in about 1 year (around 06/03/2023).

## 2022-06-12 ENCOUNTER — Other Ambulatory Visit: Payer: Self-pay | Admitting: Family Medicine

## 2022-06-30 ENCOUNTER — Ambulatory Visit: Payer: 59 | Admitting: Family Medicine

## 2022-07-02 ENCOUNTER — Other Ambulatory Visit: Payer: Self-pay | Admitting: Internal Medicine

## 2022-07-04 ENCOUNTER — Ambulatory Visit (INDEPENDENT_AMBULATORY_CARE_PROVIDER_SITE_OTHER): Payer: 59 | Admitting: Family Medicine

## 2022-07-04 ENCOUNTER — Encounter: Payer: Self-pay | Admitting: Family Medicine

## 2022-07-04 VITALS — BP 124/78 | Wt 201.2 lb

## 2022-07-04 DIAGNOSIS — I10 Essential (primary) hypertension: Secondary | ICD-10-CM

## 2022-07-04 DIAGNOSIS — E781 Pure hyperglyceridemia: Secondary | ICD-10-CM

## 2022-07-04 DIAGNOSIS — E119 Type 2 diabetes mellitus without complications: Secondary | ICD-10-CM

## 2022-07-04 DIAGNOSIS — Z23 Encounter for immunization: Secondary | ICD-10-CM | POA: Diagnosis not present

## 2022-07-04 DIAGNOSIS — K7581 Nonalcoholic steatohepatitis (NASH): Secondary | ICD-10-CM | POA: Diagnosis not present

## 2022-07-04 DIAGNOSIS — F1011 Alcohol abuse, in remission: Secondary | ICD-10-CM

## 2022-07-04 DIAGNOSIS — F0781 Postconcussional syndrome: Secondary | ICD-10-CM

## 2022-07-04 DIAGNOSIS — F411 Generalized anxiety disorder: Secondary | ICD-10-CM

## 2022-07-04 DIAGNOSIS — R748 Abnormal levels of other serum enzymes: Secondary | ICD-10-CM

## 2022-07-04 NOTE — Progress Notes (Signed)
Subjective:    Patient ID: [name], female    DOB: 13-Nov-1970, 51 y.o.   MRN: [id number]  HPI Pt arrives for follow up. Pt states no issues with blood pressure. No concerns at this time.  Patient has done a good job staying away from alcohol She denies any significant setbacks recently Essential hypertension - Plan: Lipid panel, Hepatic function panel, Hemoglobin K8M, Basic metabolic panel  Type 2 diabetes mellitus without complication, without long-term current use of insulin (HCC) - Plan: Lipid panel, Hepatic function panel, Hemoglobin N8T, Basic metabolic panel  Elevated liver enzymes - Plan: Lipid panel, Hepatic function panel, Hemoglobin R7N, Basic metabolic panel  NASH (nonalcoholic steatohepatitis) - Plan: Lipid panel, Hepatic function panel, Hemoglobin H6F, Basic metabolic panel  Post concussion syndrome - Plan: Ambulatory referral to Neurology  Alcohol use disorder, mild, in early remission  Hypertriglyceridemia - Plan: Lipid panel, Hepatic function panel, Hemoglobin B9U, Basic metabolic panel  Need for vaccination - Plan: Pneumococcal conjugate vaccine 20-valent (Prevnar 20)  Anxiety state - Plan: Ambulatory referral to Psychiatry  She does have some level of anxiousness and still desires to drink alcohol but Campral is helping  The patient was seen today as part of a comprehensive diabetic check up. Patient has diabetes Patient relates good compliance with taking the medication. We discussed their diet and exercise activities  We also discussed the importance of notifying us if any excessively high glucoses or low sugars.   Patient here for follow-up regarding cholesterol.    Patient relates taking medication on a regular basis Denies problems with medication Importance of dietary measures discussed Regular lab work regarding lipid and liver was checked and if needing additional labs was appropriately ordered   Review of Systems     Objective:    Physical Exam  General-in no acute distress Eyes-no discharge Lungs-respiratory rate normal, CTA CV-no murmurs,RRR Extremities skin warm dry no edema Neuro grossly normal Behavior normal, alert       Assessment & Plan:  1. Essential hypertension HTN- patient seen for follow-up regarding HTN.   Diet, medication compliance, appropriate labs and refills were completed.   Importance of keeping blood pressure under good control to lessen the risk of complications discussed Regular follow-up visits discussed  - Lipid panel - Hepatic function panel - Hemoglobin X8B - Basic metabolic panel  2. Type 2 diabetes mellitus without complication, without long-term current use of insulin ([place]) The patient was seen today as part of a comprehensive visit for diabetes. The importance of keeping her [name] at or below 7 range was discussed.  Discussed diet, activity, and medication compliance Emphasized healthy eating primarily with vegetables fruits and if utilizing meats lean meats such as chicken or fish grilled baked broiled Avoid sugary drinks Minimize and avoid processed foods Fit in regular physical activity preferably 25 to 30 minutes 4 times per week Standard follow-up visit recommended.  Patient aware lack of control and follow-up increases risk of diabetic complications. Regular follow-up visits Yearly ophthalmology Yearly foot exam  - Lipid panel - Hepatic function panel - Hemoglobin F3O - Basic metabolic panel  3. Elevated liver enzymes Healthy diet regular activity keep diabetes under control portion control try to lose weight - Lipid panel - Hepatic function panel - Hemoglobin V2N - Basic metabolic panel  4. NASH (nonalcoholic steatohepatitis) See per above - Lipid panel - Hepatic function panel - Hemoglobin V9T - Basic metabolic panel  5. Post concussion syndrome When patient had her syncopal spell she  had a thorough workup by neurology and cardiology did not find  reason for her syncope.  It was during that time she was misusing alcohol.  She is done a very good job staying away from alcohol over the past 13 weeks but she does use a Campral she is hopeful she can get back to having social drinking I have advised her that she would be best off staying away from alcohol She also has what appears to be postconcussion syndrome with difficulty focusing difficulty thinking things through some short-term memory lapses along with fatigue and tiredness She would benefit from neurocognitive testing we will try to help set her up with this with her neurologist - Ambulatory referral to Neurology  6. Alcohol use disorder, mild, in early remission Continue Campral.  I think she would also benefit from seeing psychiatry to get their input regarding her underlying anxiety hopefully of treating this 1 would improve to the point where she stays away from alcohol long-term  7. Hypertriglyceridemia Repeat lipid profile - Lipid panel - Hepatic function panel - Hemoglobin H4L - Basic metabolic panel  8. Need for vaccination Pneumonia vaccine today - Pneumococcal conjugate vaccine 20-valent (Prevnar 20)  9. Anxiety state Due to ongoing desire for alcohol use plus also already on Campral and treating anxiety with Celexa as well as clonazepam I highly recommend for the patient to be seen by psychiatry - Ambulatory referral to Psychiatry  Follow-up 3 months

## 2022-07-05 ENCOUNTER — Ambulatory Visit: Payer: 59 | Admitting: Allergy & Immunology

## 2022-07-05 ENCOUNTER — Telehealth: Payer: Self-pay | Admitting: Allergy & Immunology

## 2022-07-05 LAB — HEPATIC FUNCTION PANEL
ALT: 21 IU/L (ref 0–32)
AST: 30 IU/L (ref 0–40)
Albumin: 4.6 g/dL (ref 3.8–4.9)
Alkaline Phosphatase: 87 IU/L (ref 44–121)
Bilirubin Total: 0.5 mg/dL (ref 0.0–1.2)
Bilirubin, Direct: 0.14 mg/dL (ref 0.00–0.40)
Total Protein: 7.3 g/dL (ref 6.0–8.5)

## 2022-07-05 LAB — BASIC METABOLIC PANEL
BUN/Creatinine Ratio: 10 (ref 9–23)
BUN: 11 mg/dL (ref 6–24)
CO2: 26 mmol/L (ref 20–29)
Calcium: 9.8 mg/dL (ref 8.7–10.2)
Chloride: 100 mmol/L (ref 96–106)
Creatinine, Ser: 1.13 mg/dL — ABNORMAL HIGH (ref 0.57–1.00)
Glucose: 76 mg/dL (ref 70–99)
Potassium: 4.9 mmol/L (ref 3.5–5.2)
Sodium: 139 mmol/L (ref 134–144)
eGFR: 58 mL/min/{1.73_m2} — ABNORMAL LOW (ref >59)

## 2022-07-05 LAB — LIPID PANEL
Chol/HDL Ratio: 6.5 ratio — ABNORMAL HIGH (ref 0.0–4.4)
Cholesterol, Total: 258 mg/dL — ABNORMAL HIGH (ref 100–199)
HDL: 40 mg/dL (ref >39)
LDL Chol Calc (NIH): 152 mg/dL — ABNORMAL HIGH (ref 0–99)
Triglycerides: 351 mg/dL — ABNORMAL HIGH (ref 0–149)
VLDL Cholesterol Cal: 66 mg/dL — ABNORMAL HIGH (ref 5–40)

## 2022-07-05 LAB — HEMOGLOBIN A1C
Est. average glucose Bld gHb Est-mCnc: 117 mg/dL
Hgb A1c MFr Bld: 5.7 % — ABNORMAL HIGH (ref 4.8–5.6)

## 2022-07-05 NOTE — Telephone Encounter (Signed)
It appears that a Haig Prophet cancelled her appointment. Please advise. Dr. Ernst Bowler in regards to appointment and the Rinvoq.

## 2022-07-05 NOTE — Telephone Encounter (Signed)
Patient called to see if she had appointment today but it had been cancelled and she said she did not do it. So she needs to know if you need to see her. She said she doing good and wanted to know if you want her to stay on the Rinvog 15 mg. If so she needs samples because her ins does not cover it. [phone].

## 2022-07-06 NOTE — Telephone Encounter (Signed)
We need to reschedule her. I hope we are working on figuring who Haig Prophet is.   Salvatore Marvel, MD Allergy and [place] [facility] [place]

## 2022-07-08 MED ORDER — ROSUVASTATIN CALCIUM 20 MG PO TABS: 20.0000 mg | ORAL_TABLET | Freq: Every day | ORAL | 0 refills | Status: DC

## 2022-07-08 NOTE — Telephone Encounter (Signed)
Called and spoke with patient and got her scheduled to come see you this Tuesday in [place].

## 2022-07-08 NOTE — Addendum Note (Signed)
Addended by: Dairl Ponder on: 07/08/2022 04:59 PM   Modules accepted: Orders

## 2022-07-08 NOTE — Telephone Encounter (Signed)
Is there a way to get her sooner? I do not want her treatment to lapse. OK to overbook.   Salvatore Marvel, MD Allergy and [place] [facility] [place]

## 2022-07-11 ENCOUNTER — Other Ambulatory Visit: Payer: Self-pay | Admitting: Family Medicine

## 2022-07-12 ENCOUNTER — Other Ambulatory Visit: Payer: Self-pay

## 2022-07-12 ENCOUNTER — Ambulatory Visit: Payer: 59 | Admitting: Allergy & Immunology

## 2022-07-12 ENCOUNTER — Encounter: Payer: Self-pay | Admitting: Allergy & Immunology

## 2022-07-12 VITALS — BP 124/78 | HR 83 | Temp 98.3°F | Resp 16

## 2022-07-12 DIAGNOSIS — J45990 Exercise induced bronchospasm: Secondary | ICD-10-CM

## 2022-07-12 DIAGNOSIS — L235 Allergic contact dermatitis due to other chemical products: Secondary | ICD-10-CM | POA: Diagnosis not present

## 2022-07-12 DIAGNOSIS — R21 Rash and other nonspecific skin eruption: Secondary | ICD-10-CM

## 2022-07-12 DIAGNOSIS — E7800 Pure hypercholesterolemia, unspecified: Secondary | ICD-10-CM

## 2022-07-12 DIAGNOSIS — L299 Pruritus, unspecified: Secondary | ICD-10-CM

## 2022-07-12 MED ORDER — TRIAMCINOLONE ACETONIDE 0.1 % EX CREA: 1.0000 | TOPICAL_CREAM | Freq: Two times a day (BID) | CUTANEOUS | 1 refills | Status: DC

## 2022-07-12 MED ORDER — CLOBETASOL PROPIONATE 0.05 % EX SHAM: 1.0000 | MEDICATED_SHAMPOO | Freq: Every day | CUTANEOUS | 5 refills | Status: DC | PRN

## 2022-07-12 NOTE — Patient Instructions (Addendum)
1. Rash - Hold the Rinvoq for now (I was using this a bridging medication to get [place] working again).  - My partners were all nervous about using both of these at the same time.  - Continue Dupixent every two weeks.  - Add on clobetasol shampoo twice daily as needed.  - Add on triamcinolone cream twice daily as needed (for the upper chest). - Environmental testing was negative via the blood in 2022, so I do not think that skin testing would be useful. - None of this seems food related, so I do not think that this will be helpful at this time.   2. Return in about 3 months (around 10/11/2022).    Please inform us of any Emergency Department visits, hospitalizations, or changes in symptoms. Call us before going to the ED for breathing or allergy symptoms since we might be able to fit you in for a sick visit. Feel free to contact us anytime with any questions, problems, or concerns.  It was a pleasure to see you again. Say hi to your MOM for me!!   Websites that have reliable patient information: 1. American Academy of Asthma, Allergy, and Immunology: www.aaaai.org 2. Food Allergy Research and Education (FARE): foodallergy.org 3. Mothers of Asthmatics: http://www.asthmacommunitynetwork.org 4. American College of Allergy, Asthma, and Immunology: www.acaai.org   COVID-19 Vaccine Information can be found at: [email or web address].uk For questions related to vaccine distribution or appointments, please email [email or web address] or call [phone].   We realize that you might be concerned about having an allergic reaction to the COVID19 vaccines. To help with that concern, WE ARE OFFERING THE COVID19 VACCINES IN OUR OFFICE! Ask the front desk for dates!     "Like" [place] on Facebook and Instagram for our latest updates!      A healthy democracy works best when [employer] participate! Make sure you are registered to vote! If you have  moved or changed any of your contact information, you will need to get this updated before voting!  In some cases, you MAY be able to register to vote online: [email or web address]

## 2022-07-12 NOTE — Progress Notes (Signed)
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 Rash - Hold the Rinvoq for now (I was using this a bridging medication to get [place] working again).  - My partners were all nervous about using both of these at the same time.  - Continue Dupixent every two weeks.  - Add on clobetasol shampoo twice daily as needed.  - Add on triamcinolone cream twice daily as needed (for the upper chest). - Environmental testing was negative via the blood in 2022, so I do not think that skin testing would be useful. - None of this seems food related, so I do not think that this will be helpful at this time.   2. Return in about 3 months (around 10/11/2022).     Subjective:   [name] is a 51 y.o. female presenting today for follow up of  Chief Complaint  Patient presents with   ALLERGIC CONTACT DERMATITIS   Follow-up    [name] has a history of the following: Patient Active Problem List   Diagnosis Date Noted   Allergic contact dermatitis due to chemical 05/20/2022   Syncope 01/19/2022   Family history of colon cancer    Chronic diarrhea    Benign neoplasm of ascending colon    Benign neoplasm of transverse colon    Benign neoplasm of sigmoid colon    Allergic drug reaction 10/09/2020   Pruritic condition 10/09/2020   Vitamin D deficiency 05/20/2020   Hypertension    Spastic bladder    Prediabetes 70/48/8891   Diastolic dysfunction  69/45/0388   DDD (degenerative disc disease), lumbar 11/13/2019   Spinal stenosis of lumbar region 09/20/2019   Numbness of hand 05/20/2019   Acquired hypothyroidism 07/13/2017   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   Gluten-sensitive enteropathy 07/13/2017   Lactose intolerance in adult 07/13/2017   History of decompression of median nerve 02/17/2017   Carpal tunnel syndrome 12/21/2016   Pseudoarthrosis of cervical spine ([place]) 07/04/2016   History of arthrodesis 08/25/2015   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 07/16/2015   Spinal cord disease ([place]) 07/10/2015   Cervical spondylosis with myelopathy 07/10/2015   DDD (degenerative disc disease), cervical 07/10/2015   Fibromyalgia 07/07/2015   Lumbar spondylosis 03/05/2014   Degeneration of lumbar intervertebral disc 03/05/2014   Paresthesia of lower extremity 02/04/2014   Obesity, unspecified 05/09/2013   Bilateral renal cysts 05/09/2013   Dyspareunia 02/25/2013   Mild intermittent asthma without complication 82/80/0349   Hyperglycemia 11/07/2012   Other malaise and fatigue 11/07/2012   Anxiety state 04/22/2010   GERD 04/22/2010   ILEUS 04/22/2010   IRRITABLE BOWEL SYNDROME 04/22/2010   Sleep apnea 04/22/2010   LIVER FUNCTION TESTS, ABNORMAL, HX OF 04/22/2010   Generalized anxiety disorder 04/22/2010   Hyperlipidemia 12/03/2009   Essential hypertension 12/03/2009   NASH (nonalcoholic steatohepatitis) 12/03/2009   CHEST PAIN UNSPECIFIED 12/03/2009    History obtained from: chart review and patient.  [name] is a 51 y.o. female presenting for  a follow up visit.  She was last seen in August 2023.  At that time, she was undergoing treatment of her hypercholesterolemia.  Her primary care provider was managing this.  We started her on Rinvoq 15 mg daily.  We attempted to get this approved by her insurance, but unsurprisingly they denied it.  She was willing to give this a try  and we plan to do this to bridge her until the [place] started working more effectively.  We continue with levocetirizine twice daily and Pepcid twice daily.  We did recommend that she get patch tested.  In the interim, she did have patch testing which showed a slight reactivity to p-tert butylphenol formaldehyde resin as well as Cl+ Me-Isothiazolinone and gold. She has been trying to change out items.   She is on rousvastina 14m. She is getting those in 3 months rechecked. She had this done just last week or the week before. We started Rinvoq on July 11th, which is when she had the level of 560.  She had not been on a statin before that.    [place] "[place] to a number of different food items. She previously saw them for unrelated issues.  Evidently, at 1 point, she was spending $500-$600 a month on vitamins and supplements.  She feels that it was all her racquet.  She had to [place] and it worked really well for a while. Then she stopped it and restarted because of insurance coverage (she is getting it free from the company). Insurance never covered the Rinvoq. She did get some clearance right away with the Rinvoq.  Her skin has cleared up quite a bit with this combination.  She is okay with going off the Rinvoq today and remaining on the [place]  Otherwise, there have been no changes to her past medical history, surgical history, family history, or social history.    Review of Systems  Constitutional: Negative.  Negative for fever, malaise/fatigue and weight loss.  HENT: Negative.  Negative for congestion, ear discharge and ear pain.   Eyes:  Negative for pain, discharge and redness.  Respiratory:  Negative for cough, sputum production, shortness of breath and wheezing.   Cardiovascular: Negative.  Negative for chest pain and palpitations.  Gastrointestinal:  Negative for abdominal pain, heartburn, nausea and vomiting.  Skin:  Negative.  Negative for itching and rash.  Neurological:  Negative for dizziness and headaches.  Endo/Heme/Allergies:  Negative for environmental allergies. Does not bruise/bleed easily.       Objective:   Blood pressure 124/78, pulse 83, temperature 98.3 F (36.8 C), temperature source Temporal, resp. rate 16, SpO2 95 %. There is no height or weight on file to calculate BMI.    Physical Exam Vitals reviewed.  Constitutional:      Appearance: She is well-developed.  HENT:     Head: Normocephalic and atraumatic.     Right Ear: Tympanic membrane, ear canal and external ear normal.     Left Ear: Tympanic membrane, ear canal and external ear normal.     Nose: No nasal deformity, septal deviation, mucosal edema or rhinorrhea.     Right Turbinates: Enlarged, swollen and pale.     Left Turbinates: Enlarged, swollen and pale.     Right Sinus: No maxillary sinus tenderness or frontal sinus tenderness.     Left Sinus: No maxillary sinus tenderness or frontal sinus tenderness.     Mouth/Throat:  Mouth: Mucous membranes are not pale and not dry.     Pharynx: Uvula midline.  Eyes:     General: Lids are normal. No allergic shiner.       Right eye: No discharge.        Left eye: No discharge.     Conjunctiva/sclera: Conjunctivae normal.     Right eye: Right conjunctiva is not injected. No chemosis.    Left eye: Left conjunctiva is not injected. No chemosis.    Pupils: Pupils are equal, round, and reactive to light.  Cardiovascular:     Rate and Rhythm: Normal rate and regular rhythm.     Heart sounds: Normal heart sounds.  Pulmonary:     Effort: Pulmonary effort is normal. No tachypnea, accessory muscle usage or respiratory distress.     Breath sounds: Normal breath sounds. No wheezing, rhonchi or rales.     Comments: Moving air well in all lung fields Chest:     Chest wall: No tenderness.  Lymphadenopathy:     Cervical: No cervical adenopathy.  Skin:    General: Skin is warm.      Capillary Refill: Capillary refill takes less than 2 seconds.     Coloration: Skin is not pale.     Findings: Rash present. No abrasion or erythema. Rash is not papular, urticarial or vesicular.     Comments: Rash is under much better control.  Neurological:     Mental Status: She is alert.  Psychiatric:        Behavior: Behavior is cooperative.      Diagnostic studies: none        Salvatore Marvel, MD  Allergy and [place] [facility] [place]

## 2022-07-13 ENCOUNTER — Encounter: Payer: Self-pay | Admitting: Allergy & Immunology

## 2022-07-27 ENCOUNTER — Other Ambulatory Visit: Payer: Self-pay | Admitting: Internal Medicine

## 2022-07-28 ENCOUNTER — Ambulatory Visit: Payer: 59 | Admitting: Allergy & Immunology

## 2022-08-07 ENCOUNTER — Other Ambulatory Visit: Payer: Self-pay | Admitting: Family Medicine

## 2022-08-10 ENCOUNTER — Other Ambulatory Visit: Payer: Self-pay | Admitting: Family Medicine

## 2022-08-10 ENCOUNTER — Encounter: Payer: Self-pay | Admitting: *Deleted

## 2022-09-01 ENCOUNTER — Other Ambulatory Visit: Payer: Self-pay | Admitting: Family Medicine

## 2022-09-01 NOTE — Telephone Encounter (Signed)
Med d/c September 2023; unable to refuse. Please advise. Thank you

## 2022-09-07 NOTE — Progress Notes (Signed)
This encounter was created in error - please disregard.

## 2022-09-07 NOTE — Progress Notes (Signed)
error 

## 2022-10-02 ENCOUNTER — Other Ambulatory Visit: Payer: Self-pay | Admitting: Family Medicine

## 2022-10-04 ENCOUNTER — Ambulatory Visit: Payer: 59 | Admitting: Family Medicine

## 2022-10-06 LAB — HEPATIC FUNCTION PANEL
ALT: 22 IU/L (ref 0–32)
AST: 27 IU/L (ref 0–40)
Albumin: 4.6 g/dL (ref 3.8–4.9)
Alkaline Phosphatase: 99 IU/L (ref 44–121)
Bilirubin Total: 0.5 mg/dL (ref 0.0–1.2)
Bilirubin, Direct: 0.14 mg/dL (ref 0.00–0.40)
Total Protein: 7.4 g/dL (ref 6.0–8.5)

## 2022-10-06 LAB — LIPID PANEL
Chol/HDL Ratio: 3.6 ratio (ref 0.0–4.4)
Cholesterol, Total: 200 mg/dL — ABNORMAL HIGH (ref 100–199)
HDL: 56 mg/dL (ref >39)
LDL Chol Calc (NIH): 113 mg/dL — ABNORMAL HIGH (ref 0–99)
Triglycerides: 179 mg/dL — ABNORMAL HIGH (ref 0–149)
VLDL Cholesterol Cal: 31 mg/dL (ref 5–40)

## 2022-10-06 LAB — BASIC METABOLIC PANEL
BUN/Creatinine Ratio: 15 (ref 9–23)
BUN: 14 mg/dL (ref 6–24)
CO2: 23 mmol/L (ref 20–29)
Calcium: 9.5 mg/dL (ref 8.7–10.2)
Chloride: 102 mmol/L (ref 96–106)
Creatinine, Ser: 0.93 mg/dL (ref 0.57–1.00)
Glucose: 104 mg/dL — ABNORMAL HIGH (ref 70–99)
Potassium: 4.6 mmol/L (ref 3.5–5.2)
Sodium: 140 mmol/L (ref 134–144)
eGFR: 73 mL/min/{1.73_m2} (ref >59)

## 2022-10-11 ENCOUNTER — Encounter: Payer: Self-pay | Admitting: Allergy & Immunology

## 2022-10-11 ENCOUNTER — Ambulatory Visit (INDEPENDENT_AMBULATORY_CARE_PROVIDER_SITE_OTHER): Payer: 59 | Admitting: Allergy & Immunology

## 2022-10-11 ENCOUNTER — Other Ambulatory Visit: Payer: Self-pay

## 2022-10-11 VITALS — BP 122/74 | HR 76 | Temp 98.4°F | Resp 16 | Ht 65.0 in | Wt 195.2 lb

## 2022-10-11 DIAGNOSIS — J45990 Exercise induced bronchospasm: Secondary | ICD-10-CM

## 2022-10-11 DIAGNOSIS — R21 Rash and other nonspecific skin eruption: Secondary | ICD-10-CM | POA: Diagnosis not present

## 2022-10-11 DIAGNOSIS — J310 Chronic rhinitis: Secondary | ICD-10-CM | POA: Diagnosis not present

## 2022-10-11 NOTE — Patient Instructions (Addendum)
1. Rash - I am SO IMPRESSED with how your skin is looking.  - We will continue with Dupixent free through the company.  - Continue with on clobetasol shampoo twice daily as needed.  - Continue with triamcinolone cream twice daily as needed (for the upper chest).  2. Exercise induced bronchospasm - Lung testing looks great today. - We are not going to make any changes today.  - Daily controller medication(s): NOTHING - Prior to physical activity: albuterol 2 puffs 10-15 minutes before physical activity. - Rescue medications: albuterol 4 puffs every 4-6 hours as needed - Asthma control goals:  * Full participation in all desired activities (may need albuterol before activity) * Albuterol use two time or less a week on average (not counting use with activity) * Cough interfering with sleep two time or less a month * Oral steroids no more than once a year * No hospitalizations  3. Return in about 4 weeks (around 11/08/2022) for SKIN TESTING.    Please inform us of any Emergency Department visits, hospitalizations, or changes in symptoms. Call us before going to the ED for breathing or allergy symptoms since we might be able to fit you in for a sick visit. Feel free to contact us anytime with any questions, problems, or concerns.  It was a pleasure to see you again. Say hi to your MOM for me!!   Websites that have reliable patient information: 1. American Academy of Asthma, Allergy, and Immunology: www.aaaai.org 2. Food Allergy Research and Education (FARE): foodallergy.org 3. Mothers of Asthmatics: http://www.asthmacommunitynetwork.org 4. American College of Allergy, Asthma, and Immunology: www.acaai.org   COVID-19 Vaccine Information can be found at: ShippingScam.co.uk For questions related to vaccine distribution or appointments, please email [email or web address] or call [phone].   We realize that you might be concerned  about having an allergic reaction to the COVID19 vaccines. To help with that concern, WE ARE OFFERING THE COVID19 VACCINES IN OUR OFFICE! Ask the front desk for dates!     "Like" [place] on Facebook and Instagram for our latest updates!      A healthy democracy works best when [employer] participate! Make sure you are registered to vote! If you have moved or changed any of your contact information, you will need to get this updated before voting!  In some cases, you MAY be able to register to vote online: [email or web address]
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
## 2022-10-17 ENCOUNTER — Ambulatory Visit (INDEPENDENT_AMBULATORY_CARE_PROVIDER_SITE_OTHER): Payer: 59 | Admitting: Family Medicine

## 2022-10-17 VITALS — BP 138/86 | HR 70 | Ht 65.0 in | Wt 200.6 lb

## 2022-10-17 DIAGNOSIS — E119 Type 2 diabetes mellitus without complications: Secondary | ICD-10-CM

## 2022-10-17 DIAGNOSIS — F1020 Alcohol dependence, uncomplicated: Secondary | ICD-10-CM | POA: Insufficient documentation

## 2022-10-17 DIAGNOSIS — I10 Essential (primary) hypertension: Secondary | ICD-10-CM

## 2022-10-17 DIAGNOSIS — K7581 Nonalcoholic steatohepatitis (NASH): Secondary | ICD-10-CM | POA: Diagnosis not present

## 2022-10-17 DIAGNOSIS — E785 Hyperlipidemia, unspecified: Secondary | ICD-10-CM

## 2022-10-17 DIAGNOSIS — K7460 Unspecified cirrhosis of liver: Secondary | ICD-10-CM

## 2022-10-17 MED ORDER — ACAMPROSATE CALCIUM 333 MG PO TBEC: 666.0000 mg | DELAYED_RELEASE_TABLET | Freq: Three times a day (TID) | ORAL | 1 refills | Status: DC

## 2022-10-17 MED ORDER — METFORMIN HCL ER 500 MG PO TB24: 500.0000 mg | ORAL_TABLET | Freq: Two times a day (BID) | ORAL | 5 refills | Status: DC

## 2022-10-17 MED ORDER — CLONAZEPAM 1 MG PO TABS: ORAL_TABLET | ORAL | 4 refills | Status: DC

## 2022-10-17 MED ORDER — LISINOPRIL 20 MG PO TABS: 20.0000 mg | ORAL_TABLET | Freq: Every day | ORAL | 1 refills | Status: DC

## 2022-10-17 MED ORDER — CITALOPRAM HYDROBROMIDE 40 MG PO TABS: 40.0000 mg | ORAL_TABLET | Freq: Every day | ORAL | 1 refills | Status: DC
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
## 2022-10-25 NOTE — Patient Instructions (Signed)
Allergic rhinitis Your skin testing  Exercise-induced bronchospasm Continue albuterol 2 puffs once every 4 hours as needed for cough or wheeze You may use albuterol 2 puffs 5 to 15 minutes before activity to decrease cough or wheeze  Atopic dermatitis Continue a twice a day moisturizing routine Continue Dupixent injections 300 mg once every 14 days Continue triamcinolone to red or itchy areas below your face up to twice a day as needed Continue clobetasol shampoo once a day as needed  Contact dermatitis Continue to avoid contact with the following substances: p-tert butylphenol formaldehyde resin as well as Cl+ Me-Isothiazolinone and gold   Call the clinic if this treatment plan is not working well for you.  Follow up in *** or sooner if needed.

## 2022-10-25 NOTE — Progress Notes (Unsigned)
   [place] [address] Dept: [phone]  FOLLOW UP NOTE  Patient ID: [name], female    DOB: Jun 07, 1971  Age: 52 y.o. MRN: [id number] Date of Office Visit: 10/26/2022  Assessment  Chief Complaint: No chief complaint on file.  HPI [name] is a 52 year old female who presents to the clinic for follow-up visit with environmental allergy skin testing.  She was last seen in this clinic on 10/11/2022 by Dr. Ernst Bowler for evaluation of exercise induced bronchospasm, atopic dermatitis, and contact dermatitis.   Drug Allergies:  Allergies  Allergen Reactions   Cefdinir Itching   Dairy Aid [Tilactase]     Unsure of reaction.    Gluten Meal Other (See Comments)    Unsure of reaction.   Levaquin [Levofloxacin In D5w] Diarrhea and Nausea And Vomiting   Other     PT STATES NO BLOOD PRODUCTS OR BLOOD TRANSFUSIONS    Topamax [Topiramate]     Causes anger   Wheat Other (See Comments)    Unsure of reaction.    Physical Exam: There were no vitals taken for this visit.   Physical Exam  Diagnostics:    Assessment and Plan: No diagnosis found.  No orders of the defined types were placed in this encounter.   There are no Patient Instructions on file for this visit.  No follow-ups on file.    Thank you for the opportunity to care for this patient.  Please do not hesitate to contact me with questions.  Gareth Morgan, FNP [facility]

## 2022-10-26 ENCOUNTER — Ambulatory Visit (INDEPENDENT_AMBULATORY_CARE_PROVIDER_SITE_OTHER): Payer: 59 | Admitting: Family Medicine

## 2022-10-26 ENCOUNTER — Encounter: Payer: Self-pay | Admitting: Family Medicine

## 2022-10-26 ENCOUNTER — Other Ambulatory Visit: Payer: Self-pay

## 2022-10-26 VITALS — BP 124/80 | HR 84 | Temp 98.1°F | Resp 12 | Wt 197.8 lb

## 2022-10-26 DIAGNOSIS — J3089 Other allergic rhinitis: Secondary | ICD-10-CM | POA: Diagnosis not present

## 2022-10-26 DIAGNOSIS — L2389 Allergic contact dermatitis due to other agents: Secondary | ICD-10-CM

## 2022-10-26 DIAGNOSIS — J302 Other seasonal allergic rhinitis: Secondary | ICD-10-CM

## 2022-10-26 DIAGNOSIS — T781XXA Other adverse food reactions, not elsewhere classified, initial encounter: Secondary | ICD-10-CM

## 2022-10-26 DIAGNOSIS — J45990 Exercise induced bronchospasm: Secondary | ICD-10-CM

## 2022-10-26 DIAGNOSIS — L209 Atopic dermatitis, unspecified: Secondary | ICD-10-CM

## 2022-11-15 ENCOUNTER — Telehealth: Payer: Self-pay

## 2022-11-15 ENCOUNTER — Encounter: Payer: Self-pay | Admitting: Family Medicine

## 2022-11-15 NOTE — Telephone Encounter (Signed)
The patient had sent a MyChart message regarding ongoing headache nausea and whether or not to do a lumbar puncture Message was sent via nurses it would be wise for them to consider going back to the ER May schedule follow-up office visit within the next 10 days after going back to the ER

## 2022-11-15 NOTE — Transitions of Care (Post Inpatient/ED Visit) (Signed)
   11/15/2022  Name: [name] MRN: [id number] DOB: Oct 09, 1970  Today's TOC FU Call Status: Today's TOC FU Call Status:: Successful TOC FU Call Competed TOC FU Call Complete Date: 11/15/22  Transition Care Management Follow-up Telephone Call Date of Discharge: 11/13/22 Type of Discharge: Emergency Department Reason for ED Visit: Other: (migraine) How have you been since you were released from the hospital?: Same Any questions or concerns?: No  Items Reviewed: Did you receive and understand the discharge instructions provided?: Yes Medications obtained and verified?: Yes (Medications Reviewed) Any new allergies since your discharge?: No Dietary orders reviewed?: Yes Do you have support at home?: Yes People in Home: spouse  Home Care and Equipment/Supplies: Were Home Health Services Ordered?: NA Has Agency set up a time to come to your home?: No Any new equipment or medical supplies ordered?: NA  Functional Questionnaire: Do you need assistance with bathing/showering or dressing?: No Do you need assistance with meal preparation?: No Do you need assistance with eating?: No Do you have difficulty maintaining continence: No Do you need assistance with getting out of bed/getting out of a chair/moving?: No Do you have difficulty managing or taking your medications?: No  Follow up appointments reviewed: PCP Follow-up appointment confirmed?: No (no avail appt times, sent message to staff) Specialist Hospital Follow-up appointment confirmed?: NA Do you need transportation to your follow-up appointment?: No Do you understand care options if your condition(s) worsen?: Yes-patient verbalized understanding    SIGNATURE Karena Addison, LPN [facility] Nurse Health Advisor Direct Dial [phone]

## 2022-11-15 NOTE — Telephone Encounter (Signed)
Nurses With white blood count being elevated and also ongoing headache and neck pain I would recommend that she follow-up with the ER regarding the possibility of lumbar puncture  Hard to know if they will do lumbar puncture at this point or possibly recommend a different approach.

## 2022-11-15 NOTE — Telephone Encounter (Signed)
See my chart message- message sent to patient via my chart

## 2022-11-15 NOTE — Patient Instructions (Incomplete)
Allergic rhinitis Skin testing on 10/26/22 was positive to grass pollen and dog  Continue avoidance measures are listed below Continue an antihistamine once a day as needed for runny nose or itch Continue Flonase or Nasacort 1 spray in each nostril once a day as needed for stuffy nose.  In the right nostril, point the applicator out toward the right ear. In the left nostril, point the applicator out toward the left ear Consider saline nasal rinses as needed for nasal symptoms. Use this before any medicated nasal sprays for best result  Exercise-induced bronchospasm Continue albuterol 2 puffs once every 4 hours as needed for cough or wheeze You may use albuterol 2 puffs 5 to 15 minutes before activity to decrease cough or wheeze  Atopic dermatitis Continue a twice a day moisturizing routine Continue Dupixent injections 300 mg once every 14 days Continue triamcinolone to red or itchy areas below your face up to twice a day as needed Continue clobetasol shampoo once a day as needed  Contact dermatitis Continue to avoid contact with the following substances: p-tert butylphenol formaldehyde resin as well as Cl+ Me-Isothiazolinone and gold   Food allergy versus sensitivity Skin testing to select foods today was negative with adequate controls. Discussed how histamine would be smaller in size due to Dupixent injections Copy of skin test given   Call the clinic if this treatment plan is not working well for you.  Follow up in 3-4 months or sooner if needed.  Reducing Pollen Exposure The American Academy of Allergy, Asthma and Immunology suggests the following steps to reduce your exposure to pollen during allergy seasons. Do not hang sheets or clothing out to dry; pollen may collect on these items. Do not mow lawns or spend time around freshly cut grass; mowing stirs up pollen. Keep windows closed at night.  Keep car windows closed while driving. Minimize morning activities outdoors, a time  when pollen counts are usually at their highest. Stay indoors as much as possible when pollen counts or humidity is high and on windy days when pollen tends to remain in the air longer. Use air conditioning when possible.  Many air conditioners have filters that trap the pollen spores. Use a HEPA room air filter to remove pollen form the indoor air you breathe.  Control of Dog or Cat Allergen Avoidance is the best way to manage a dog or cat allergy. If you have a dog or cat and are allergic to dog or cats, consider removing the dog or cat from the home. If you have a dog or cat but don't want to find it a new home, or if your family wants a pet even though someone in the household is allergic, here are some strategies that may help keep symptoms at bay:  Keep the pet out of your bedroom and restrict it to only a few rooms. Be advised that keeping the dog or cat in only one room will not limit the allergens to that room. Don't pet, hug or kiss the dog or cat; if you do, wash your hands with soap and water. High-efficiency particulate air (HEPA) cleaners run continuously in a bedroom or living room can reduce allergen levels over time. Regular use of a high-efficiency vacuum cleaner or a central vacuum can reduce allergen levels. Giving your dog or cat a bath at least once a week can reduce airborne allergen.

## 2022-11-16 ENCOUNTER — Encounter: Payer: Self-pay | Admitting: Family

## 2022-11-16 ENCOUNTER — Other Ambulatory Visit: Payer: Self-pay

## 2022-11-16 ENCOUNTER — Ambulatory Visit (INDEPENDENT_AMBULATORY_CARE_PROVIDER_SITE_OTHER): Payer: 59 | Admitting: Family

## 2022-11-16 VITALS — BP 122/80 | HR 83 | Temp 97.9°F | Resp 12 | Wt 191.4 lb

## 2022-11-16 DIAGNOSIS — L209 Atopic dermatitis, unspecified: Secondary | ICD-10-CM | POA: Diagnosis not present

## 2022-11-16 DIAGNOSIS — J3089 Other allergic rhinitis: Secondary | ICD-10-CM

## 2022-11-16 DIAGNOSIS — J302 Other seasonal allergic rhinitis: Secondary | ICD-10-CM

## 2022-11-16 DIAGNOSIS — L2389 Allergic contact dermatitis due to other agents: Secondary | ICD-10-CM | POA: Diagnosis not present

## 2022-11-16 DIAGNOSIS — J45990 Exercise induced bronchospasm: Secondary | ICD-10-CM | POA: Diagnosis not present

## 2022-11-16 NOTE — Progress Notes (Signed)
[address] [place] [address] Dept: [phone]  FOLLOW UP NOTE  Patient ID: [name], female    DOB: [birth year]  Age: 52 y.o. MRN: [id number] Date of Office Visit: 11/16/2022  Assessment  Chief Complaint: Allergy Testing (food)  HPI [name] is a 52 year old female who presents today for skin testing to select foods.  She was last seen on October 26, 2022 by Thermon Leyland, FNP for seasonal and perennial allergic rhinitis, exercise-induced bronchospasm, atopic dermatitis, allergic contact dermatitis, and food sensitivity with gastrointestinal symptoms.  She reports no known problems with foods that she knows of and has not been keeping a journal of what she is eaten recently to see how it affects her skin.  She reports the reason that she wants skin testing to select foods is because for the past couple years she has been breaking out on her chest, neck, hair and hands.  She has had patch testing and  then 2 weeks ago we did environmental skin testing.  Her skin is actually under good control right now with Dupixent. She was previously been on Rinvoq.  She reports that the breakouts are itchy and if she scratches them they hurt.  Denies any concomitant cardiorespiratory and gastrointestinal symptoms.  She has also met her deductible this year and would like to get skin testing to all foods to make sure that they are not causing any problems with her skin.  She does not eat any seafood so she does not want to get tested for this.   Drug Allergies:  Allergies  Allergen Reactions   Cefdinir Itching   Dairy Aid [Tilactase]     Unsure of reaction.    Gluten Meal Other (See Comments)    Unsure of reaction.   Levaquin [Levofloxacin In D5w] Diarrhea and Nausea And Vomiting   Other     PT STATES NO BLOOD PRODUCTS OR BLOOD TRANSFUSIONS    Topamax [Topiramate]     Causes anger   Wheat Other (See Comments)    Unsure of reaction.    Review of Systems: Review of Systems   Constitutional:  Negative for chills and fever.  HENT:         Denies rhinorrhea, nasal congestion, and postnasal drip  Eyes:        Reports itchy watery eyes sometimes when mowing grass  Respiratory:  Negative for cough, shortness of breath and wheezing.   Cardiovascular:  Negative for chest pain and palpitations.  Gastrointestinal:        Denies heartburn or reflux symptoms  Skin:  Positive for itching and rash.       Reports itchy rash that then burns after scratching. It is gotten better with Dupixent.  She was previously also on Rinvoq.  She reports her skin looks better now.  This has been ongoing for couple of years  Neurological:  Positive for headaches.       Reports a migraine since Saturday and she plans to go back to the ER for this after today's visit.  Endo/Heme/Allergies:  Positive for environmental allergies.     Physical Exam: BP 122/80   Pulse 83   Temp 97.9 F (36.6 C) (Temporal)   Resp 12   Wt 191 lb 6.4 oz (86.8 kg)   SpO2 99%   BMI 31.85 kg/m    Physical Exam Constitutional:      Appearance: Normal appearance.  HENT:     Head: Normocephalic and atraumatic.  Comments: Pharynx normal, eyes normal, ears normal, nose normal    Right Ear: Tympanic membrane, ear canal and external ear normal.     Left Ear: Tympanic membrane, ear canal and external ear normal.     Nose: Nose normal.     Mouth/Throat:     Mouth: Mucous membranes are moist.     Pharynx: Oropharynx is clear.  Eyes:     Conjunctiva/sclera: Conjunctivae normal.  Cardiovascular:     Rate and Rhythm: Regular rhythm.     Heart sounds: Normal heart sounds.  Pulmonary:     Effort: Pulmonary effort is normal.     Breath sounds: Normal breath sounds.     Comments: Lungs clear to auscultation Musculoskeletal:     Cervical back: Neck supple.  Skin:    General: Skin is warm.     Comments: Redness with areas of hypopigmentation noted on chest. Small erythematous macules noted on posterior  aspect of neck  Neurological:     Mental Status: She is alert and oriented to person, place, and time.  Psychiatric:        Mood and Affect: Mood normal.        Behavior: Behavior normal.        Thought Content: Thought content normal.        Judgment: Judgment normal.     Diagnostics:  Skin testing to select foods today is negative with adequate controls.   Food Adult Perc - 11/16/22 1400     Time Antigen Placed 1402    Allergen Manufacturer Waynette Buttery    Location Back    Number of allergen test 60     Control-buffer 50% Glycerol Negative    Control-Histamine 1 mg/ml 2+    1. Peanut Negative    2. Soybean Negative    3. Wheat Negative    4. Sesame Negative    5. Milk, cow Negative    6. Egg White, Chicken Negative    7. Casein Negative    8. Shellfish Mix Negative    9. Fish Mix Negative    10. Cashew Negative    11. Pecan Food Negative    12. Walnut Food Negative    13. Almond Negative    14. Hazelnut Negative    15. [place] nut Negative    16. Coconut Negative    17. Pistachio Negative    30. Barley Negative    31. Oat  Negative    32. Rye  Negative    33. Hops Negative    34. Rice Negative    35. Cottonseed Negative    36. Saccharomyces Cerevisiae  Negative    37. Pork Negative    38. [place] Meat Negative    39. Chicken Meat Negative    40. Beef Negative    41. Lamb Negative    42. Tomato Negative    43. White Potato Negative    44. Sweet Potato Negative    45. Pea, Green/English Negative    46. Navy Bean Negative    47. Mushrooms Negative    48. Avocado Negative    49. Onion Negative    50. Cabbage Negative    51. Carrots Negative    52. Celery Negative    53. Corn Negative    54. Cucumber Negative    55. Grape (White seedless) Negative    56. Orange  Negative    57. Banana Negative    58. Apple Negative    59. Peach Negative  60. Strawberry Negative    61. Cantaloupe Negative    62. Watermelon Negative    63. Pineapple Negative    64.  Chocolate/Cacao bean Negative    65. Karaya Gum Negative    66. Acacia (Arabic Gum) Negative    67. Cinnamon Negative    68. Nutmeg Negative    69. Ginger Negative    70. Garlic Negative    71. Pepper, black Negative    72. Mustard Negative              Assessment and Plan: 1. Atopic dermatitis, unspecified type   2. Exercise-induced bronchospasm   3. Allergic contact dermatitis due to other agents   4. Seasonal and perennial allergic rhinitis     No orders of the defined types were placed in this encounter.   Patient Instructions  Allergic rhinitis Skin testing on 10/26/22 was positive to grass pollen and dog  Continue avoidance measures are listed below Continue an antihistamine once a day as needed for runny nose or itch Continue Flonase or Nasacort 1 spray in each nostril once a day as needed for stuffy nose.  In the right nostril, point the applicator out toward the right ear. In the left nostril, point the applicator out toward the left ear Consider saline nasal rinses as needed for nasal symptoms. Use this before any medicated nasal sprays for best result  Exercise-induced bronchospasm Continue albuterol 2 puffs once every 4 hours as needed for cough or wheeze You may use albuterol 2 puffs 5 to 15 minutes before activity to decrease cough or wheeze  Atopic dermatitis Continue a twice a day moisturizing routine Continue Dupixent injections 300 mg once every 14 days Continue triamcinolone to red or itchy areas below your face up to twice a day as needed Continue clobetasol shampoo once a day as needed  Contact dermatitis Continue to avoid contact with the following substances: p-tert butylphenol formaldehyde resin as well as Cl+ Me-Isothiazolinone and gold   Food allergy versus sensitivity Skin testing to select foods today was negative with adequate controls. Discussed how histamine would be smaller in size due to Dupixent injections Copy of skin test  given   Call the clinic if this treatment plan is not working well for you.  Follow up in 3-4 months or sooner if needed.  Reducing Pollen Exposure The American Academy of Allergy, Asthma and Immunology suggests the following steps to reduce your exposure to pollen during allergy seasons. Do not hang sheets or clothing out to dry; pollen may collect on these items. Do not mow lawns or spend time around freshly cut grass; mowing stirs up pollen. Keep windows closed at night.  Keep car windows closed while driving. Minimize morning activities outdoors, a time when pollen counts are usually at their highest. Stay indoors as much as possible when pollen counts or humidity is high and on windy days when pollen tends to remain in the air longer. Use air conditioning when possible.  Many air conditioners have filters that trap the pollen spores. Use a HEPA room air filter to remove pollen form the indoor air you breathe.  Control of Dog or Cat Allergen Avoidance is the best way to manage a dog or cat allergy. If you have a dog or cat and are allergic to dog or cats, consider removing the dog or cat from the home. If you have a dog or cat but don't want to find it a new home, or if your family wants  a pet even though someone in the household is allergic, here are some strategies that may help keep symptoms at bay:  Keep the pet out of your bedroom and restrict it to only a few rooms. Be advised that keeping the dog or cat in only one room will not limit the allergens to that room. Don't pet, hug or kiss the dog or cat; if you do, wash your hands with soap and water. High-efficiency particulate air (HEPA) cleaners run continuously in a bedroom or living room can reduce allergen levels over time. Regular use of a high-efficiency vacuum cleaner or a central vacuum can reduce allergen levels. Giving your dog or cat a bath at least once a week can reduce airborne allergen.   Return in about 3 months  (around 02/15/2023), or if symptoms worsen or fail to improve.    Thank you for the opportunity to care for this patient.  Please do not hesitate to contact me with questions.  Nehemiah Settle, FNP [facility]

## 2022-11-18 ENCOUNTER — Telehealth: Payer: Self-pay

## 2022-11-18 NOTE — Transitions of Care (Post Inpatient/ED Visit) (Signed)
   11/18/2022  Name: [name] MRN: [id number] DOB: 1971/04/22  Today's TOC FU Call Status: Today's TOC FU Call Status:: Successful TOC FU Call Competed TOC FU Call Complete Date: 11/18/22  Transition Care Management Follow-up Telephone Call Date of Discharge: 11/17/22 Discharge Facility: Other ([facility]) Name of Other ([facility]) [facility] Facility: [facility] Type of Discharge: Emergency Department Reason for ED Visit: Other: (headache) How have you been since you were released from the hospital?: Better Any questions or concerns?: No  Items Reviewed: Did you receive and understand the discharge instructions provided?: Yes Medications obtained and verified?: Yes (Medications Reviewed) Any new allergies since your discharge?: No Dietary orders reviewed?: Yes Do you have support at home?: Yes People in Home: spouse  Home Care and Equipment/Supplies: Were Home Health Services Ordered?: NA Any new equipment or medical supplies ordered?: NA  Functional Questionnaire: Do you need assistance with bathing/showering or dressing?: No Do you need assistance with meal preparation?: No Do you need assistance with eating?: No Do you have difficulty maintaining continence: No Do you need assistance with getting out of bed/getting out of a chair/moving?: No Do you have difficulty managing or taking your medications?: No  Follow up appointments reviewed: Specialist Hospital Follow-up appointment confirmed?: NA Do you need transportation to your follow-up appointment?: No Do you understand care options if your condition(s) worsen?: Yes-patient verbalized understanding    SIGNATURE Karena Addison, LPN [facility] Nurse Health Advisor Direct Dial [phone]

## 2022-11-21 ENCOUNTER — Other Ambulatory Visit: Payer: Self-pay | Admitting: Family Medicine

## 2022-11-21 ENCOUNTER — Other Ambulatory Visit: Payer: Self-pay | Admitting: Internal Medicine

## 2022-11-21 ENCOUNTER — Other Ambulatory Visit: Payer: Self-pay | Admitting: Allergy & Immunology

## 2022-11-22 ENCOUNTER — Encounter: Payer: Self-pay | Admitting: Family Medicine

## 2022-11-22 ENCOUNTER — Ambulatory Visit (INDEPENDENT_AMBULATORY_CARE_PROVIDER_SITE_OTHER): Payer: 59 | Admitting: Family Medicine

## 2022-11-22 VITALS — BP 106/68 | HR 90 | Ht 65.0 in | Wt 194.2 lb

## 2022-11-22 DIAGNOSIS — R509 Fever, unspecified: Secondary | ICD-10-CM

## 2022-11-22 DIAGNOSIS — D72829 Elevated white blood cell count, unspecified: Secondary | ICD-10-CM | POA: Diagnosis not present

## 2022-11-22 DIAGNOSIS — N390 Urinary tract infection, site not specified: Secondary | ICD-10-CM | POA: Diagnosis not present

## 2022-11-22 NOTE — Progress Notes (Signed)
   Subjective:    Patient ID: [name], female    DOB: [birth year], 52 y.o.   MRN: [id number]  HPI Patient arrives for hospital follow up Hospital records reviewed Labs test reviewed  Review of Systems     Objective:   Physical Exam General-in no acute distress Eyes-no discharge Lungs-respiratory rate normal, CTA CV-no murmurs,RRR Extremities skin warm dry no edema Neuro grossly normal Behavior normal, alert        Assessment & Plan:  Hospital follow-up More than likely her headache fever was from UTI As for the repetitive UTIs I do not feel she has a persistent UTI but does have frequent UTIs we did discuss ways to try to combat this It is reasonable to get a consultation with urology to see if any other measures I would not recommend long-term Macrobid because of increased risk of pulmonary fibrosis Urinary tract infection without hematuria, site unspecified  Febrile illness  Leukocytosis, unspecified type  Frequent UTI We will have patient do lab work for follow-up regarding elevated white blood count basic chemistries and urinalysis and urine culture done through [facility]  Patient will have follow-up visit later in August sooner if any problems

## 2022-11-24 NOTE — Telephone Encounter (Signed)
Weird-I already sent in these but please send them again 6 months on each medicine but the Crestor needs to be 20 mg daily thank you

## 2022-12-08 ENCOUNTER — Other Ambulatory Visit: Payer: Self-pay | Admitting: Family Medicine

## 2022-12-08 ENCOUNTER — Ambulatory Visit (INDEPENDENT_AMBULATORY_CARE_PROVIDER_SITE_OTHER): Payer: 59 | Admitting: Urology

## 2022-12-08 ENCOUNTER — Encounter: Payer: Self-pay | Admitting: Urology

## 2022-12-08 VITALS — BP 144/96 | HR 93 | Ht 65.0 in | Wt 190.0 lb

## 2022-12-08 DIAGNOSIS — Z09 Encounter for follow-up examination after completed treatment for conditions other than malignant neoplasm: Secondary | ICD-10-CM | POA: Diagnosis not present

## 2022-12-08 DIAGNOSIS — Z87440 Personal history of urinary (tract) infections: Secondary | ICD-10-CM | POA: Diagnosis not present

## 2022-12-08 DIAGNOSIS — N390 Urinary tract infection, site not specified: Secondary | ICD-10-CM

## 2022-12-08 LAB — URINALYSIS, ROUTINE W REFLEX MICROSCOPIC
Bilirubin, UA: NEGATIVE
Glucose, UA: NEGATIVE
Ketones, UA: NEGATIVE
Leukocytes,UA: NEGATIVE
Nitrite, UA: NEGATIVE
Protein,UA: NEGATIVE
Specific Gravity, UA: 1.015 (ref 1.005–1.030)
Urobilinogen, Ur: 0.2 mg/dL (ref 0.2–1.0)
pH, UA: 6 (ref 5.0–7.5)

## 2022-12-08 LAB — MICROSCOPIC EXAMINATION
Cast Type: NONE SEEN
Casts: NONE SEEN /lpf
Crystal Type: NONE SEEN
Crystals: NONE SEEN
Mucus, UA: NONE SEEN
Trichomonas, UA: NONE SEEN
Yeast, UA: NONE SEEN

## 2022-12-08 MED ORDER — SULFAMETHOXAZOLE-TRIMETHOPRIM 400-80 MG PO TABS: 1.0000 | ORAL_TABLET | Freq: Every evening | ORAL | 3 refills | Status: DC

## 2022-12-08 NOTE — Progress Notes (Signed)
Assessment: 1. Recurrent UTI      Plan: Today I had a long discussion with the patient regarding UTI prevention strategies.  Educational material was provided. At this time I recommended low-dose suppression with Bactrim to break her UTI cycle. She is already on HRT but will assess need for additional local estrogen. Urinary probiotic-lactobacillus recommended Follow-up 6 to 8 weeks for female exam and cystoscopy.  Chief Complaint:  Chief Complaint  Patient presents with   Frequent UTI    History of Present Illness:  [name] is a 52 y.o. female who is seen in consultation from Babs Sciara, MD for evaluation of recurrent UTI. Patient has had a long history of recurrent urinary tract infections including a history of Pilo when she was in her 5s and recently also questionable febrile UTI.  She has had at least 4 UTIs in the past year and her typical symptoms are dysuria frequency and urgency.   She feels very strongly that her UTIs are temporally related to sexual intercourse.  She typically has intercourse several times a week. She has previously been on long-term Macrodantin suppression. She is currently on HRT-implant pellets  CT A/P 10/2022 at [facility]-- IMPRESSION:  1. Gastric wall thickening may represent gastritis or other etiologies. Distal esophageal wall thickening may represent esophagitis or other etiologies. Small hiatal hernia. Follow-up recommended.  2.   Tiny nonobstructing left renal stone(s) measuring 2-3 mm.   Past Medical History:  Past Medical History:  Diagnosis Date   Allergy    Anemia    hx   Anxiety    takes Klonopin daily as needed.takes Celexa daily   Arthritis    left foot and hands   Asthma    exercise induced   Back pain    Bilateral swelling of feet    Complication of anesthesia    slow to wake  up   Diabetes mellitus without complication (HCC)    Diastolic dysfunction 11/2019   per patient    Granuloma annulare    Herpes  infection    High cholesterol    takes Pravastatin daily   History of bronchitis    several yrs ago   History of migraine    last one about 2 wks ago   Hypertension    IBS (irritable bowel syndrome)    takes Trulance and ProBiotic Daily   Inflammation of hair follicles    takes Minocycline daily   Insomnia    takes Melatonin nightly   Joint pain    Lactose intolerance    Liver fibrosis    Menopausal state    Migraine    NASH (nonalcoholic steatohepatitis)    Prediabetes 01/01/2020   Sleep apnea    sleep study > 5 yrs ago. no CPAP use    Sleep apnea    Spastic bladder    takes Myrbetriq daily   Tingling    r/t neck. Pseudoarthrosis    Tubular adenoma of colon    UTI (urinary tract infection)    hx of but takes Macrodantin daily. has been on for 2 1/2 yrs     Past Surgical History:  Past Surgical History:  Procedure Laterality Date   ABDOMINAL HYSTERECTOMY  2005   complete hysterectomy both ovaries removed   BIOPSY  10/14/2021   Procedure: BIOPSY;  Surgeon: Beverley Fiedler, MD;  Location: [facility];  Service: Gastroenterology;;   BREAST REDUCTION SURGERY  2009   CARPAL TUNNEL RELEASE     carpel  tunnel Bilateral 02/2017, 2021   CERVICAL DISC SURGERY  2007, 2009, 2014, 2017   anterior  x 3 , posterior x 2    CESAREAN SECTION  94/03   x 2   COLONOSCOPY     COLONOSCOPY WITH PROPOFOL N/A 10/14/2021   Procedure: COLONOSCOPY WITH PROPOFOL;  Surgeon: Beverley Fiedler, MD;  Location: [facility];  Service: Gastroenterology;  Laterality: N/A;   ESOPHAGOGASTRODUODENOSCOPY     POLYPECTOMY  10/14/2021   Procedure: POLYPECTOMY;  Surgeon: Beverley Fiedler, MD;  Location: [facility];  Service: Gastroenterology;;   POSTERIOR CERVICAL FUSION/FORAMINOTOMY N/A 01/07/2013   Procedure: POSTERIOR CERVICAL FUSION/FORAMINOTOMY LEVEL 2;  Surgeon: Hewitt Shorts, MD;  Location: [facility];  Service: Neurosurgery;  Laterality: N/A;  Posterior Cervical Five-Six/Six-Seven Arthrodesis with  Instrumentation   POSTERIOR CERVICAL FUSION/FORAMINOTOMY N/A 07/04/2016   Procedure: CERVICAL THREE-FOUR POSTERIOR CERVICAL ARTHRODESIS;  Surgeon: Shirlean Kelly, MD;  Location: [facility];  Service: Neurosurgery;  Laterality: N/A;  C3-C4 POSTERIOR CERVICAL ARTHRODESIS   tummy tuck     TYMPANOSTOMY TUBE PLACEMENT Bilateral    As a baby   UPPER GASTROINTESTINAL ENDOSCOPY      Allergies:  Allergies  Allergen Reactions   Cefdinir Itching   Dairy Aid [Tilactase]     Unsure of reaction.    Dog Epithelium Itching   Gluten Meal Other (See Comments)    Unsure of reaction.   Grass Pollen(K-O-R-T-Swt Vern) Itching    migraine   Levaquin [Levofloxacin In D5w] Diarrhea and Nausea And Vomiting   Other     PT STATES NO BLOOD PRODUCTS OR BLOOD TRANSFUSIONS    Topamax [Topiramate]     Causes anger   Wheat Other (See Comments)    Unsure of reaction.    Family History:  Family History  Problem Relation Age of Onset   Ovarian cancer Mother    Hypertension Mother    Irritable bowel syndrome Mother    Neuropathy Mother    Hyperlipidemia Mother    Cancer Mother    Cirrhosis Father        alcohol abuse   Diabetes Father    Hypertension Father    Alcoholism Father    Hyperlipidemia Father    Liver disease Father    Alcohol abuse Father    Drug abuse Brother    Colon cancer Maternal Grandmother        age unknown-3's   CAD Maternal Grandfather    Colon polyps Neg Hx    Esophageal cancer Neg Hx    Rectal cancer Neg Hx    Stomach cancer Neg Hx     Social History:  Social History   Tobacco Use   Smoking status: Never   Smokeless tobacco: Never  Vaping Use   Vaping Use: Never used  Substance Use Topics   Alcohol use: Yes    Alcohol/week: 1.0 standard drink of alcohol    Types: 1 Glasses of wine per week    Comment: occasional   Drug use: No    Review of symptoms:  Constitutional:  Negative for unexplained weight loss, night sweats, fever, chills ENT:  Negative for nose  bleeds, sinus pain, painful swallowing CV:  Negative for chest pain, shortness of breath, exercise intolerance, palpitations, loss of consciousness Resp:  Negative for cough, wheezing, shortness of breath GI:  Negative for nausea, vomiting, diarrhea, bloody stools GU:  Positives noted in HPI; otherwise negative for gross hematuria, dysuria, urinary incontinence Neuro:  Negative for seizures, poor balance, limb weakness,  slurred speech Psych:  Negative for lack of energy, depression, anxiety Endocrine:  Negative for polydipsia, polyuria, symptoms of hypoglycemia (dizziness, hunger, sweating) Hematologic:  Negative for anemia, purpura, petechia, prolonged or excessive bleeding, use of anticoagulants  Allergic:  Negative for difficulty breathing or choking as a result of exposure to anything; no shellfish allergy; no allergic response (rash/itch) to materials, foods  Physical exam: BP (!) 144/96   Pulse 93   Ht 5\' 5"  (1.651 m)   Wt 190 lb (86.2 kg)   BMI 31.62 kg/m  GENERAL APPEARANCE:  Well appearing, well developed, well nourished, NAD   Results: UA neg for infection

## 2023-01-13 ENCOUNTER — Other Ambulatory Visit: Payer: Self-pay | Admitting: Family Medicine

## 2023-01-19 ENCOUNTER — Ambulatory Visit (INDEPENDENT_AMBULATORY_CARE_PROVIDER_SITE_OTHER): Payer: 59 | Admitting: Urology

## 2023-01-19 ENCOUNTER — Other Ambulatory Visit: Payer: Self-pay | Admitting: Family Medicine

## 2023-01-19 DIAGNOSIS — N3281 Overactive bladder: Secondary | ICD-10-CM | POA: Diagnosis not present

## 2023-01-19 DIAGNOSIS — Z87440 Personal history of urinary (tract) infections: Secondary | ICD-10-CM

## 2023-01-19 DIAGNOSIS — R82998 Other abnormal findings in urine: Secondary | ICD-10-CM | POA: Diagnosis not present

## 2023-01-19 DIAGNOSIS — N390 Urinary tract infection, site not specified: Secondary | ICD-10-CM

## 2023-01-19 LAB — URINALYSIS, ROUTINE W REFLEX MICROSCOPIC
Bilirubin, UA: NEGATIVE
Glucose, UA: NEGATIVE
Ketones, UA: NEGATIVE
Leukocytes,UA: NEGATIVE
Nitrite, UA: NEGATIVE
Protein,UA: NEGATIVE
Specific Gravity, UA: 1.015 (ref 1.005–1.030)
Urobilinogen, Ur: 0.2 mg/dL (ref 0.2–1.0)
pH, UA: 6 (ref 5.0–7.5)

## 2023-01-19 LAB — MICROSCOPIC EXAMINATION
Cast Type: NONE SEEN
Casts: NONE SEEN /lpf
Crystal Type: NONE SEEN
Crystals: NONE SEEN
Mucus, UA: NONE SEEN
Renal Epithel, UA: NONE SEEN /hpf
Trichomonas, UA: NONE SEEN
Yeast, UA: NONE SEEN

## 2023-01-19 MED ORDER — FLUCONAZOLE 100 MG PO TABS: 100.0000 mg | ORAL_TABLET | Freq: Every day | ORAL | 0 refills | Status: DC

## 2023-01-19 MED ORDER — SULFAMETHOXAZOLE-TRIMETHOPRIM 400-80 MG PO TABS: 1.0000 | ORAL_TABLET | Freq: Every evening | ORAL | 3 refills | Status: DC

## 2023-01-19 MED ORDER — CIPROFLOXACIN HCL 500 MG PO TABS
500.0000 mg | ORAL_TABLET | Freq: Once | ORAL | Status: DC
Start: 2023-01-19 — End: 2023-08-09

## 2023-01-19 MED ORDER — ESTRADIOL 0.1 MG/GM VA CREA: TOPICAL_CREAM | VAGINAL | 12 refills | Status: AC

## 2023-01-19 NOTE — Addendum Note (Signed)
Addended by: Tilden Dome on: 01/19/2023 01:26 PM   Modules accepted: Orders

## 2023-01-19 NOTE — Progress Notes (Signed)
Assessment: 1. Recurrent UTI   2. OAB (overactive bladder)     Plan: Following the cysto today I sat down and had a long discussion with the patient and her husband. Cath urine for culture today Will cover with diflucan 100mg  daily for yeast in urine Continue vesicare and myrbetriq for oab  UTI prevention again reviewed in detail and patient edu materials provided-- Start vag estrogen cream (Rx estrace 3x weekly) Urinary probiotic Will transition to post-coital prophylaxis FU 57mo for recheck  Chief Complaint: Chief Complaint  Patient presents with   Cysto    HPI: [name] is a 52 y.o. female who presents for continued evaluation of recurrent UTI and OAB (on vesicare and myrbetriq). Has been on LD bactrim suppression past month.  Doing well but has noticed cloudy urine last day or so. UA today dip neg; yeast present Cath urine sent for culture   Portions of the above documentation were copied from a prior visit for review purposes only.  Allergies: Allergies  Allergen Reactions   Cefdinir Itching   Dairy Aid [Tilactase]     Unsure of reaction.    Dog Epithelium Itching   Gluten Meal Other (See Comments)    Unsure of reaction.   Grass Pollen(K-O-R-T-Swt Vern) Itching    migraine   Levaquin [Levofloxacin In D5w] Diarrhea and Nausea And Vomiting   Other     PT STATES NO BLOOD PRODUCTS OR BLOOD TRANSFUSIONS    Topamax [Topiramate]     Causes anger   Wheat Other (See Comments)    Unsure of reaction.    PMH: Past Medical History:  Diagnosis Date   Allergy    Anemia    hx   Anxiety    takes Klonopin daily as needed.takes Celexa daily   Arthritis    left foot and hands   Asthma    exercise induced   Back pain    Bilateral swelling of feet    Complication of anesthesia    slow to wake  up   Diabetes mellitus without complication (HCC)    Diastolic dysfunction 11/2019   per patient    Granuloma annulare    Herpes infection    High  cholesterol    takes Pravastatin daily   History of bronchitis    several yrs ago   History of migraine    last one about 2 wks ago   Hypertension    IBS (irritable bowel syndrome)    takes Trulance and ProBiotic Daily   Inflammation of hair follicles    takes Minocycline daily   Insomnia    takes Melatonin nightly   Joint pain    Lactose intolerance    Liver fibrosis    Menopausal state    Migraine    NASH (nonalcoholic steatohepatitis)    Prediabetes 01/01/2020   Sleep apnea    sleep study > 5 yrs ago. no CPAP use    Sleep apnea    Spastic bladder    takes Myrbetriq daily   Tingling    r/t neck. Pseudoarthrosis    Tubular adenoma of colon    UTI (urinary tract infection)    hx of but takes Macrodantin daily. has been on for 2 1/2 yrs     PSH: Past Surgical History:  Procedure Laterality Date   ABDOMINAL HYSTERECTOMY  2005   complete hysterectomy both ovaries removed   BIOPSY  10/14/2021   Procedure: BIOPSY;  Surgeon: Beverley Fiedler, MD;  Location: [facility];  Service: Gastroenterology;;   BREAST REDUCTION SURGERY  2009   CARPAL TUNNEL RELEASE     carpel tunnel Bilateral 02/2017, 2021   CERVICAL DISC SURGERY  2007, 2009, 2014, 2017   anterior  x 3 , posterior x 2    CESAREAN SECTION  94/03   x 2   COLONOSCOPY     COLONOSCOPY WITH PROPOFOL N/A 10/14/2021   Procedure: COLONOSCOPY WITH PROPOFOL;  Surgeon: Beverley Fiedler, MD;  Location: [facility];  Service: Gastroenterology;  Laterality: N/A;   ESOPHAGOGASTRODUODENOSCOPY     POLYPECTOMY  10/14/2021   Procedure: POLYPECTOMY;  Surgeon: Beverley Fiedler, MD;  Location: [facility];  Service: Gastroenterology;;   POSTERIOR CERVICAL FUSION/FORAMINOTOMY N/A 01/07/2013   Procedure: POSTERIOR CERVICAL FUSION/FORAMINOTOMY LEVEL 2;  Surgeon: Hewitt Shorts, MD;  Location: [facility];  Service: Neurosurgery;  Laterality: N/A;  Posterior Cervical Five-Six/Six-Seven Arthrodesis with Instrumentation   POSTERIOR CERVICAL  FUSION/FORAMINOTOMY N/A 07/04/2016   Procedure: CERVICAL THREE-FOUR POSTERIOR CERVICAL ARTHRODESIS;  Surgeon: Shirlean Kelly, MD;  [facility];  Service: Neurosurgery;  Laterality: N/A;  C3-C4 POSTERIOR CERVICAL ARTHRODESIS   tummy tuck     TYMPANOSTOMY TUBE PLACEMENT Bilateral    As a baby   UPPER GASTROINTESTINAL ENDOSCOPY      SH: Social History   Tobacco Use   Smoking status: Never   Smokeless tobacco: Never  Vaping Use   Vaping Use: Never used  Substance Use Topics   Alcohol use: Yes    Alcohol/week: 1.0 standard drink of alcohol    Types: 1 Glasses of wine per week    Comment: occasional   Drug use: No    ROS: Constitutional:  Negative for fever, chills, weight loss CV: Negative for chest pain, previous MI, hypertension Respiratory:  Negative for shortness of breath, wheezing, sleep apnea, frequent cough GI:  Negative for nausea, vomiting, bloody stool, GERD  PE: There were no vitals taken for this visit. GENERAL APPEARANCE:  Well appearing, well developed, well nourished, NAD  PROCEDURE:  FEMALE CYSTOSCOPY  INDICATION:  RECURRENT UTI  DESCRIPTION OF PROCEDURE: The procedure was again reviewed with the patient and informed consent obtained preprocedural timeout was performed.  I began by performing a female exam with speculum.  The patient is noted to have mild atrophic vaginitis.  There is good pelvic support without evidence of any prolapse.  No leakage with cough or Valsalva.  No palpable masses.  Flexible cystoscopy was subsequently performed.  This reveals a normal urethra.  The bladder was entered and carefully inspected.  There is noted to be whitish debris consistent with yeast.  There were no focal mucosal lesions appreciated.  Patient tolerated the procedure well.   Results: Results for orders placed or performed in visit on 01/19/23 (from the past 24 hour(s))  Microscopic Examination   Collection Time: 01/19/23 10:31 AM   Urine  Result Value  Ref Range   WBC, UA 0-5 0 - 5 /hpf   RBC, Urine 0-2 0 - 2 /hpf   Epithelial Cells (non renal) 0-10 0 - 10 /hpf   Renal Epithel, UA None seen None seen /hpf   Casts None seen None seen /lpf   Cast Type None seen N/A   Crystals None seen N/A   Crystal Type None seen N/A   Mucus, UA None seen Not Estab.   Bacteria, UA Many (A) None seen/Few   Yeast, UA None seen None seen   Trichomonas, UA None seen None seen  Urinalysis,  Routine w reflex microscopic   Collection Time: 01/19/23 10:31 AM  Result Value Ref Range   Specific Gravity, UA 1.015 1.005 - 1.030   pH, UA 6.0 5.0 - 7.5   Color, UA Yellow Yellow   Appearance Ur Hazy (A) Clear   Leukocytes,UA Negative Negative   Protein,UA Negative Negative/Trace   Glucose, UA Negative Negative   Ketones, UA Negative Negative   RBC, UA Trace (A) Negative   Bilirubin, UA Negative Negative   Urobilinogen, Ur 0.2 0.2 - 1.0 mg/dL   Nitrite, UA Negative Negative   Microscopic Examination See below:

## 2023-01-20 NOTE — Telephone Encounter (Signed)
Patient may have 6 months on acyclovir as for the statin because that is not on the med list do not refill

## 2023-01-21 LAB — URINE CULTURE

## 2023-01-23 ENCOUNTER — Other Ambulatory Visit: Payer: Self-pay | Admitting: Family Medicine

## 2023-01-23 ENCOUNTER — Encounter: Payer: Self-pay | Admitting: Family Medicine

## 2023-01-23 NOTE — Telephone Encounter (Signed)
Please clarify with patient which dose of rosuvastatin is she taking 10 mg or 20 mg I sent her a MyChart message today I have not seen her response Her record reflects both 10 mg as well as 20 mg

## 2023-01-23 NOTE — Telephone Encounter (Signed)
Nurses Please order [name], lipid, liver, metabolic 7, urine ACR, TSH, free T4  Diagnosis Hypothyroidism, diabetes, hypertension, hyperlipidemia She can do these lab work several days before her follow-up visit in August Also let her know that we did send in her Crestor 20 mg daily thank you

## 2023-01-25 ENCOUNTER — Other Ambulatory Visit: Payer: Self-pay

## 2023-01-25 DIAGNOSIS — E781 Pure hyperglyceridemia: Secondary | ICD-10-CM

## 2023-01-25 DIAGNOSIS — R748 Abnormal levels of other serum enzymes: Secondary | ICD-10-CM

## 2023-01-25 DIAGNOSIS — E039 Hypothyroidism, unspecified: Secondary | ICD-10-CM

## 2023-01-25 DIAGNOSIS — E119 Type 2 diabetes mellitus without complications: Secondary | ICD-10-CM

## 2023-01-25 DIAGNOSIS — E785 Hyperlipidemia, unspecified: Secondary | ICD-10-CM

## 2023-02-27 ENCOUNTER — Encounter: Payer: Self-pay | Admitting: Family Medicine

## 2023-02-27 ENCOUNTER — Ambulatory Visit (INDEPENDENT_AMBULATORY_CARE_PROVIDER_SITE_OTHER): Payer: Managed Care, Other (non HMO) | Admitting: Family Medicine

## 2023-02-27 VITALS — BP 123/81 | HR 87 | Temp 97.9°F | Ht 65.0 in | Wt 201.0 lb

## 2023-02-27 DIAGNOSIS — E785 Hyperlipidemia, unspecified: Secondary | ICD-10-CM

## 2023-02-27 DIAGNOSIS — E039 Hypothyroidism, unspecified: Secondary | ICD-10-CM

## 2023-02-27 DIAGNOSIS — E875 Hyperkalemia: Secondary | ICD-10-CM

## 2023-02-27 DIAGNOSIS — R0789 Other chest pain: Secondary | ICD-10-CM | POA: Diagnosis not present

## 2023-02-27 DIAGNOSIS — I10 Essential (primary) hypertension: Secondary | ICD-10-CM

## 2023-02-27 DIAGNOSIS — R7989 Other specified abnormal findings of blood chemistry: Secondary | ICD-10-CM

## 2023-02-27 DIAGNOSIS — E119 Type 2 diabetes mellitus without complications: Secondary | ICD-10-CM

## 2023-02-27 DIAGNOSIS — K7581 Nonalcoholic steatohepatitis (NASH): Secondary | ICD-10-CM

## 2023-02-27 NOTE — Progress Notes (Signed)
   Subjective:    Patient ID: [name], female    DOB: Dec 12, 1970, 52 y.o.   MRN: [id number]  HPI  6 month follow up  Patient still has unusual spells where she does not really blackout she just loses awareness of what is going on in terms of being alert and oriented these do not happen frequently.  She has been seen by cardiology and neurology and had seizures ruled out.  She thinks it is due to her to Dupixent HTN, recent labs, and discuss continued blackouts - with incoherent speech once again the spells do not happen frequently  Her potassium slightly elevated she was taking a potassium supplement she was encouraged to stop her potassium supplement In addition to this she also relates tight feeling in her chest with shortness of breath with certain activities denies wheezing with it has history of asthma but has risk factors for heart disease   Review of Systems     Objective:   Physical Exam General-in no acute distress Eyes-no discharge Lungs-respiratory rate normal, CTA CV-no murmurs,RRR Extremities skin warm dry no edema Neuro grossly normal Behavior normal, alert  Creatinine slightly elevated Potassium slightly elevated, [name] 6.1 Urine micro protein normal LDL 114 liver enzymes normal      Assessment & Plan:  1. Chest tightness She describes some intermittent chest tightness when she is doing activity she thinks it may be her allergies but she denies chest pressure is just more of a tight feeling she has multiple risk factors for heart disease she also relates gets out of breath at the same time therefore would be reasonable to see cardiology for further evaluation-she would like to see the same cardiology group she saw previously - Ambulatory referral to Cardiology  2. Hyperkalemia Slightly elevated stop potassium supplement recheck lab again in 1 to 2 weeks - Basic Metabolic Panel  3. Acquired hypothyroidism Continue current medication  4.  Hyperlipidemia, unspecified hyperlipidemia type LDL above goal we will wait to see what workup from cardiology is if coronary artery disease we will need to bump up on her dose-given her history of elevated liver enzymes hold off on bumping up the dose of her statin currently  5. NASH (nonalcoholic steatohepatitis) Liver enzymes look good currently  6. Essential hypertension Blood pressure good control  7. Type 2 diabetes mellitus without complication, without long-term current use of insulin (HCC) Continue medication portion control regular physical activity  8. Elevated serum creatinine Check lab work in 1 to 2 weeks when well-hydrated

## 2023-03-06 ENCOUNTER — Telehealth: Payer: Self-pay

## 2023-03-06 NOTE — Telephone Encounter (Signed)
Fyi - Information received from psychiatrist doctor from [facility] , doctor Allyson Sabal [phone]  States that the patient was contacted and she stated that the required appts will conflict with the religious obligations that she must attend on Mondays, states patient more than likely will not agree to appts.

## 2023-03-10 NOTE — Telephone Encounter (Signed)
So noted thank you 

## 2023-03-10 NOTE — Telephone Encounter (Signed)
I did check her care everywhere as well as referrals I do not see anything regarding this particular area certainly we are available should patient reach out let us know if she needs additional resources

## 2023-04-05 ENCOUNTER — Other Ambulatory Visit: Payer: Self-pay | Admitting: Family Medicine

## 2023-04-11 ENCOUNTER — Ambulatory Visit: Payer: 59 | Admitting: Urology

## 2023-04-19 ENCOUNTER — Encounter: Payer: Self-pay | Admitting: Urology

## 2023-04-19 ENCOUNTER — Ambulatory Visit (INDEPENDENT_AMBULATORY_CARE_PROVIDER_SITE_OTHER): Payer: Managed Care, Other (non HMO) | Admitting: Urology

## 2023-04-19 VITALS — BP 138/92 | HR 83 | Ht 65.0 in | Wt 195.0 lb

## 2023-04-19 DIAGNOSIS — Z87440 Personal history of urinary (tract) infections: Secondary | ICD-10-CM

## 2023-04-19 DIAGNOSIS — N3281 Overactive bladder: Secondary | ICD-10-CM

## 2023-04-19 DIAGNOSIS — N390 Urinary tract infection, site not specified: Secondary | ICD-10-CM

## 2023-04-19 MED ORDER — SULFAMETHOXAZOLE-TRIMETHOPRIM 800-160 MG PO TABS: 1.0000 | ORAL_TABLET | Freq: Once | ORAL | 3 refills | Status: DC | PRN

## 2023-04-19 NOTE — Progress Notes (Signed)
Assessment: 1. Recurrent UTI   2. OAB (overactive bladder)     Plan: Patient will continue Vesicare for OAB-prescribed by her gynecologist  She will continue current UTI prevention program-- Urinary probiotic Cranberry supplement Vaginal estrogen cream  She will discontinue nightly low-dose suppression with Bactrim RS and will begin postcoital prophylaxis with Bactrim DS.  Follow-up 1 year or sooner if problems arise  Chief Complaint: Recurrent UTI/OAB  HPI: [name] is a 52 y.o. female who presents for continued evaluation of recurrent UTI and OAB.Marland Kitchen See my note 12/08/2022 at the time of initial visit for detailed history and exam. Patient continues to take vesicare 10mg  for oab (Rx by GYN).  Doing well from this standpoint.  She is also using UTI prevention strategies including urinary probiotic, cranberry suppl and vaginal estrogen cream and nightly LD bactrim suppression.  She does note a temporal relationship between UTIs and sexual intercourse as well.  Currently doing well without specific GU complaints.  Urinalysis today is entirely clear   Portions of the above documentation were copied from a prior visit for review purposes only.  Allergies: Allergies  Allergen Reactions   Cefdinir Itching   Dairy Aid [Tilactase]     Unsure of reaction.    Dog Epithelium Itching   Gluten Meal Other (See Comments)    Unsure of reaction.   Grass Pollen(K-O-R-T-Swt Vern) Itching    migraine   Levaquin [Levofloxacin In D5w] Diarrhea and Nausea And Vomiting   Other     PT STATES NO BLOOD PRODUCTS OR BLOOD TRANSFUSIONS    Topamax [Topiramate]     Causes anger   Wheat Other (See Comments)    Unsure of reaction.    PMH: Past Medical History:  Diagnosis Date   Allergy    Anemia    hx   Anxiety    takes Klonopin daily as needed.takes Celexa daily   Arthritis    left foot and hands   Asthma    exercise induced   Back pain    Bilateral swelling of feet     Complication of anesthesia    slow to wake  up   Diabetes mellitus without complication (HCC)    Diastolic dysfunction 11/2019   per patient    Granuloma annulare    Herpes infection    High cholesterol    takes Pravastatin daily   History of bronchitis    several yrs ago   History of migraine    last one about 2 wks ago   Hypertension    IBS (irritable bowel syndrome)    takes Trulance and ProBiotic Daily   Inflammation of hair follicles    takes Minocycline daily   Insomnia    takes Melatonin nightly   Joint pain    Lactose intolerance    Liver fibrosis    Menopausal state    Migraine    NASH (nonalcoholic steatohepatitis)    Prediabetes 01/01/2020   Sleep apnea    sleep study > 5 yrs ago. no CPAP use    Sleep apnea    Spastic bladder    takes Myrbetriq daily   Tingling    r/t neck. Pseudoarthrosis    Tubular adenoma of colon    UTI (urinary tract infection)    hx of but takes Macrodantin daily. has been on for 2 1/2 yrs     PSH: Past Surgical History:  Procedure Laterality Date   ABDOMINAL HYSTERECTOMY  2005   complete hysterectomy both ovaries  removed   BIOPSY  10/14/2021   Procedure: BIOPSY;  Surgeon: Beverley Fiedler, MD;  Location: [facility];  Service: Gastroenterology;;   BREAST REDUCTION SURGERY  2009   CARPAL TUNNEL RELEASE     carpel tunnel Bilateral 02/2017, 2021   CERVICAL DISC SURGERY  2007, 2009, 2014, 2017   anterior  x 3 , posterior x 2    CESAREAN SECTION  94/03   x 2   COLONOSCOPY     COLONOSCOPY WITH PROPOFOL N/A 10/14/2021   Procedure: COLONOSCOPY WITH PROPOFOL;  Surgeon: Beverley Fiedler, MD;  Location: [facility];  Service: Gastroenterology;  Laterality: N/A;   ESOPHAGOGASTRODUODENOSCOPY     POLYPECTOMY  10/14/2021   Procedure: POLYPECTOMY;  Surgeon: Beverley Fiedler, MD;  Location: [facility];  Service: Gastroenterology;;   POSTERIOR CERVICAL FUSION/FORAMINOTOMY N/A 01/07/2013   Procedure: POSTERIOR CERVICAL FUSION/FORAMINOTOMY LEVEL 2;   Surgeon: Hewitt Shorts, MD;  Location: [facility];  Service: Neurosurgery;  Laterality: N/A;  Posterior Cervical Five-Six/Six-Seven Arthrodesis with Instrumentation   POSTERIOR CERVICAL FUSION/FORAMINOTOMY N/A 07/04/2016   Procedure: CERVICAL THREE-FOUR POSTERIOR CERVICAL ARTHRODESIS;  Surgeon: Shirlean Kelly, MD;  Location: [facility];  Service: Neurosurgery;  Laterality: N/A;  C3-C4 POSTERIOR CERVICAL ARTHRODESIS   tummy tuck     TYMPANOSTOMY TUBE PLACEMENT Bilateral    As a baby   UPPER GASTROINTESTINAL ENDOSCOPY      SH: Social History   Tobacco Use   Smoking status: Never   Smokeless tobacco: Never  Vaping Use   Vaping status: Never Used  Substance Use Topics   Alcohol use: Yes    Alcohol/week: 1.0 standard drink of alcohol    Types: 1 Glasses of wine per week    Comment: occasional   Drug use: No    ROS: Constitutional:  Negative for fever, chills, weight loss CV: Negative for chest pain, previous MI, hypertension Respiratory:  Negative for shortness of breath, wheezing, sleep apnea, frequent cough GI:  Negative for nausea, vomiting, bloody stool, GERD  PE: BP (!) 138/92   Pulse 83   Ht 5\' 5"  (1.651 m)   Wt 195 lb (88.5 kg)   BMI 32.45 kg/m  GENERAL APPEARANCE:  Well appearing, well developed, well nourished, NAD    Results: UA clear

## 2023-04-24 LAB — URINALYSIS, ROUTINE W REFLEX MICROSCOPIC
Bilirubin, UA: NEGATIVE
Glucose, UA: NEGATIVE
Ketones, UA: NEGATIVE
Leukocytes,UA: NEGATIVE
Nitrite, UA: NEGATIVE
Protein,UA: NEGATIVE
RBC, UA: NEGATIVE
Specific Gravity, UA: 1.01 (ref 1.005–1.030)
Urobilinogen, Ur: 0.2 mg/dL (ref 0.2–1.0)
pH, UA: 6 (ref 5.0–7.5)

## 2023-04-27 ENCOUNTER — Ambulatory Visit: Payer: 59 | Admitting: Allergy & Immunology

## 2023-04-27 ENCOUNTER — Encounter: Payer: Self-pay | Admitting: Allergy & Immunology

## 2023-04-27 ENCOUNTER — Other Ambulatory Visit: Payer: Self-pay

## 2023-04-27 VITALS — BP 138/82 | HR 80 | Temp 98.1°F | Resp 18

## 2023-04-27 DIAGNOSIS — T7819XA Other adverse food reactions, not elsewhere classified, initial encounter: Secondary | ICD-10-CM

## 2023-04-27 DIAGNOSIS — J45990 Exercise induced bronchospasm: Secondary | ICD-10-CM

## 2023-04-27 DIAGNOSIS — L2089 Other atopic dermatitis: Secondary | ICD-10-CM

## 2023-04-27 DIAGNOSIS — T781XXA Other adverse food reactions, not elsewhere classified, initial encounter: Secondary | ICD-10-CM

## 2023-04-27 DIAGNOSIS — L299 Pruritus, unspecified: Secondary | ICD-10-CM

## 2023-04-27 DIAGNOSIS — L2389 Allergic contact dermatitis due to other agents: Secondary | ICD-10-CM

## 2023-04-27 NOTE — Progress Notes (Signed)
FOLLOW UP  Date of Service/Encounter:  04/27/23   Assessment:   Exercise-induced bronchospasm - with normal spirometry    Rash - improving with Dupixent   Failed multiple topical medications including Eucrisa, Protopic, hydrocortisone, clobetasol, and triamcinolone   Contact dermatitis - with sensitizations to p-tert butylphenol formaldehyde resin as well as Cl+ Me-Isothiazolinone and gold   Hypercholesterolemia - now on a statin with improvement in numbers   Mild transaminitis - with a history of NASH    Plan/Recommendations:    1. Rash - I am so bummed about the Dupixent reaction since this was working so well. - We are going to work on getting Adbry approved. - This is a 4 injection loading dose and 2 injections every two weeks. - BUT it is approved to wean to two injections every month once symptoms are under better control. - Consent signed today.  - Continue with on clobetasol shampoo twice daily as needed.  - Continue with triamcinolone cream twice daily as needed (for the upper chest). - [name] will be in touch with the approval process.   2. Exercise induced bronchospasm - Lung testing not done today.  - Daily controller medication(s): NOTHING - Prior to physical activity: albuterol 2 puffs 10-15 minutes before physical activity. - Rescue medications: albuterol 4 puffs every 4-6 hours as needed - Asthma control goals:  * Full participation in all desired activities (may need albuterol before activity) * Albuterol use two time or less a week on average (not counting use with activity) * Cough interfering with sleep two time or less a month * Oral steroids no more than once a year * No hospitalizations  3. Seasonal and perennial allergic rhinitis (grass, dog) - Continue with cetirizine 10mg  daily. - Continue with levocetirizine 5 mg daily. - Continue with Flonase one spray per nostril daily as needed.   4. Contact dermatitis - with sensitizations to p-tert  butylphenol formaldehyde resin as well as Cl+ Me-Isothiazolinone and gold - Continue to avoid all of your triggering chemicals.  5. Return in about 3 months (around 07/28/2023).    Subjective:   [name] is a 52 y.o. female presenting today for follow up of  Chief Complaint  Patient presents with   Medication Management   Eczema    [name] has a history of the following: Patient Active Problem List   Diagnosis Date Noted   Alcohol use disorder, severe, dependence (HCC) 10/17/2022   Allergic contact dermatitis due to chemical 05/20/2022   Syncope 01/19/2022   Family history of colon cancer    Chronic diarrhea    Benign neoplasm of ascending colon    Benign neoplasm of transverse colon    Benign neoplasm of sigmoid colon    Allergic drug reaction 10/09/2020   Pruritic condition 10/09/2020   Vitamin D deficiency 05/20/2020   Hypertension    Spastic bladder    Prediabetes 01/01/2020   Diastolic dysfunction 12/09/2019   DDD (degenerative disc disease), lumbar 11/13/2019   Spinal stenosis of lumbar region 09/20/2019   Numbness of hand 05/20/2019   Acquired hypothyroidism 07/13/2017   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   Gluten-sensitive enteropathy 07/13/2017   Lactose intolerance in adult 07/13/2017   History of decompression of median nerve 02/17/2017   Carpal tunnel syndrome 12/21/2016   Pseudoarthrosis of cervical spine (HCC) 07/04/2016   History of arthrodesis 08/25/2015   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 07/16/2015   Spinal cord disease (HCC)  07/10/2015   Cervical spondylosis with myelopathy 07/10/2015   DDD (degenerative disc disease), cervical 07/10/2015   Fibromyalgia 07/07/2015   Lumbar spondylosis 03/05/2014   Degeneration of lumbar intervertebral disc 03/05/2014   Paresthesia of lower extremity 02/04/2014   Obesity, unspecified 05/09/2013   Bilateral renal cysts 05/09/2013    Dyspareunia 02/25/2013   Mild intermittent asthma without complication 12/29/2012   Hyperglycemia 11/07/2012   Other malaise and fatigue 11/07/2012   Anxiety state 04/22/2010   GERD 04/22/2010   ILEUS 04/22/2010   IRRITABLE BOWEL SYNDROME 04/22/2010   Sleep apnea 04/22/2010   LIVER FUNCTION TESTS, ABNORMAL, HX OF 04/22/2010   Generalized anxiety disorder 04/22/2010   Hyperlipidemia 12/03/2009   Essential hypertension 12/03/2009   Liver cirrhosis secondary to NASH (HCC) 12/03/2009   CHEST PAIN UNSPECIFIED 12/03/2009    History obtained from: chart review and patient.  Discussed the use of AI scribe software for clinical note transcription with the patient, who gave verbal consent to proceed.  [name] is a 52 y.o. female presenting for a follow up visit.  The patient, with a history of atopic dermatitis, presents with a recurrence of skin symptoms after discontinuation of Dupixent injections. She had been on Dupixent for approximately a year and a half, but stopped the medication two months ago due to concerning side effects. The patient reported episodes of altered consciousness and unusual behavior occurring two days after each injection, with the first episode resulting in a fall that required stitches and staples. She did not lose consciousness during these episodes but described feeling "not there" and engaging in actions she would not normally do, such as attempting to leave their bedroom unclothed. She was also noted to be saying certain things that are out of character for her. She has not had the Dupixent in two months and she has not had one of these episodes at all.   Since discontinuing Dupixent, the patient has noticed a return of her dermatitis symptoms, including bumps and rashes on her arms, chest, and face. She has been attempting to manage these symptoms with clobetasol shampoo, but reports it has not been effective. The patient also reports that her skin appears to be  thinning, as she has noticed increased bruising and bleeding with minor trauma. She has been using the topical steroid, but even before using the topical steroid, she has problems with skin thinning.   In addition to her dermatitis, the patient experienced a fall unrelated to her Dupixent-induced episodes, during which she injured her knee. She reports that her knee "gave out," but denies any associated loss of consciousness or intoxication.  The patient was previously on Rinvoq in addition to Dupixent, but this was discontinued due to elevated cholesterol levels. Since discontinuing both medications, the patient reports a gradual return of her dermatitis symptoms. She is open to trying a different injectable medication.     Otherwise, there have been no changes to her past medical history, surgical history, family history, or social history.    Review of systems otherwise negative other than that mentioned in the HPI.    Objective:   Blood pressure 138/82, pulse 80, temperature 98.1 F (36.7 C), temperature source Temporal, resp. rate 18, SpO2 96%. There is no height or weight on file to calculate BMI.    Physical Exam Vitals reviewed.  Constitutional:      Appearance: She is well-developed.  HENT:     Head: Normocephalic and atraumatic.     Right Ear: Tympanic membrane, ear canal  and external ear normal.     Left Ear: Tympanic membrane, ear canal and external ear normal.     Nose: No nasal deformity, septal deviation, mucosal edema or rhinorrhea.     Right Turbinates: Enlarged, swollen and pale.     Left Turbinates: Enlarged, swollen and pale.     Right Sinus: No maxillary sinus tenderness or frontal sinus tenderness.     Left Sinus: No maxillary sinus tenderness or frontal sinus tenderness.     Mouth/Throat:     Mouth: Mucous membranes are not pale and not dry.     Pharynx: Uvula midline.  Eyes:     General: Lids are normal. No allergic shiner.       Right eye: No  discharge.        Left eye: No discharge.     Conjunctiva/sclera: Conjunctivae normal.     Right eye: Right conjunctiva is not injected. No chemosis.    Left eye: Left conjunctiva is not injected. No chemosis.    Pupils: Pupils are equal, round, and reactive to light.  Cardiovascular:     Rate and Rhythm: Normal rate and regular rhythm.     Heart sounds: Normal heart sounds.  Pulmonary:     Effort: Pulmonary effort is normal. No tachypnea, accessory muscle usage or respiratory distress.     Breath sounds: Normal breath sounds. No wheezing, rhonchi or rales.     Comments: Moving air well in all lung fields Chest:     Chest wall: No tenderness.  Lymphadenopathy:     Cervical: No cervical adenopathy.  Skin:    General: Skin is warm.     Capillary Refill: Capillary refill takes less than 2 seconds.     Coloration: Skin is not pale.     Findings: Rash present. No abrasion or erythema. Rash is not papular, urticarial or vesicular.     Comments: Rash is slowly coming back. It appears like a sunburn once again on her upper chest and her arms. The skin is not as thickened as it was the first time that I saw her.   Neurological:     Mental Status: She is alert.  Psychiatric:        Behavior: Behavior is cooperative.      Diagnostic studies: none      Malachi Bonds, MD  [facility]

## 2023-04-27 NOTE — Patient Instructions (Addendum)
1. Rash - I am so bummed about the Dupixent reaction since this was working so well. - We are going to work on getting Adbry approved. - This is a 4 injection loading dose and 2 injections every two weeks. - BUT it is approved to wean to two injections every month once symptoms are under better control. - Consent signed today.  - Continue with on clobetasol shampoo twice daily as needed.  - Continue with triamcinolone cream twice daily as needed (for the upper chest). - Tammy will be in touch with the approval process.   2. Exercise induced bronchospasm - Lung testing not done today.  - Daily controller medication(s): NOTHING - Prior to physical activity: albuterol 2 puffs 10-15 minutes before physical activity. - Rescue medications: albuterol 4 puffs every 4-6 hours as needed - Asthma control goals:  * Full participation in all desired activities (may need albuterol before activity) * Albuterol use two time or less a week on average (not counting use with activity) * Cough interfering with sleep two time or less a month * Oral steroids no more than once a year * No hospitalizations  3. Seasonal and perennial allergic rhinitis (grass, dog) - Continue with cetirizine 10mg  daily. - Continue with levocetirizine 5 mg daily. - Continue with Flonase one spray per nostril daily as needed.   4. Contact dermatitis - with sensitizations to p-tert butylphenol formaldehyde resin as well as Cl+ Me-Isothiazolinone and gold - Continue to avoid all of your triggering chemicals.  5. Return in about 3 months (around 07/28/2023).    Please inform us of any Emergency Department visits, hospitalizations, or changes in symptoms. Call us before going to the ED for breathing or allergy symptoms since we might be able to fit you in for a sick visit. Feel free to contact us anytime with any questions, problems, or concerns.  It was a pleasure to see you again. Say hi to your MOM for me!!   Websites that  have reliable patient information: 1. American Academy of Asthma, Allergy, and Immunology: www.aaaai.org 2. Food Allergy Research and Education (FARE): foodallergy.org 3. Mothers of Asthmatics: http://www.asthmacommunitynetwork.org 4. American College of Allergy, Asthma, and Immunology: www.acaai.org   COVID-19 Vaccine Information can be found at: [email or web address] For questions related to vaccine distribution or appointments, please email [email or web address] or call [phone].   We realize that you might be concerned about having an allergic reaction to the COVID19 vaccines. To help with that concern, WE ARE OFFERING THE COVID19 VACCINES IN OUR OFFICE! Ask the front desk for dates!     "Like" [place] on Facebook and Instagram for our latest updates!      A healthy democracy works best when [employer] participate! Make sure you are registered to vote! If you have moved or changed any of your contact information, you will need to get this updated before voting!  In some cases, you MAY be able to register to vote online: [email or web address]

## 2023-04-28 ENCOUNTER — Encounter: Payer: Self-pay | Admitting: Allergy & Immunology

## 2023-04-28 MED ORDER — LEVOCETIRIZINE DIHYDROCHLORIDE 5 MG PO TABS: 5.0000 mg | ORAL_TABLET | Freq: Every day | ORAL | 5 refills | Status: DC

## 2023-04-28 MED ORDER — TRIAMCINOLONE ACETONIDE 0.5 % EX OINT: 1.0000 | TOPICAL_OINTMENT | Freq: Two times a day (BID) | CUTANEOUS | 5 refills | Status: DC | PRN

## 2023-04-28 MED ORDER — FLUTICASONE PROPIONATE 50 MCG/ACT NA SUSP: 1.0000 | Freq: Every day | NASAL | 5 refills | Status: DC | PRN

## 2023-04-28 MED ORDER — CETIRIZINE HCL 10 MG PO TABS: 10.0000 mg | ORAL_TABLET | Freq: Every day | ORAL | 5 refills | Status: DC

## 2023-04-28 MED ORDER — ALBUTEROL SULFATE HFA 108 (90 BASE) MCG/ACT IN AERS: 2.0000 | INHALATION_SPRAY | RESPIRATORY_TRACT | 1 refills | Status: AC | PRN

## 2023-04-28 MED ORDER — CLOBETASOL PROPIONATE 0.05 % EX SHAM: 1.0000 | MEDICATED_SHAMPOO | Freq: Every day | CUTANEOUS | 5 refills | Status: DC | PRN

## 2023-05-08 ENCOUNTER — Other Ambulatory Visit: Payer: Self-pay | Admitting: Family Medicine

## 2023-05-08 ENCOUNTER — Ambulatory Visit: Payer: 59 | Admitting: Allergy & Immunology

## 2023-05-10 ENCOUNTER — Other Ambulatory Visit: Payer: Self-pay | Admitting: Obstetrics & Gynecology

## 2023-05-10 DIAGNOSIS — N3941 Urge incontinence: Secondary | ICD-10-CM

## 2023-05-26 NOTE — Telephone Encounter (Signed)
Called patient and advised tried to get Adbry approved with Ins but denied due to not trial of 2 immunosuppresive agents. I will sed PAP app to patient to see if we can get it that way.

## 2023-05-26 NOTE — Telephone Encounter (Signed)
-----   Message from Alfonse Spruce sent at 04/28/2023 12:33 PM EDT ----- Can we change to Adbry?

## 2023-05-29 NOTE — Telephone Encounter (Signed)
Her insurance is the worst. Another option is to go back on Rinvoq. She is now on cholesterol medication from her PCP, so I would feel a bit better about doing the Rinvoq.   Malachi Bonds, MD [facility]

## 2023-06-01 ENCOUNTER — Other Ambulatory Visit: Payer: Self-pay | Admitting: Family Medicine

## 2023-06-02 NOTE — Telephone Encounter (Signed)
I have sent her Adbry PAP application to return to me

## 2023-07-02 ENCOUNTER — Other Ambulatory Visit: Payer: Self-pay | Admitting: Allergy & Immunology

## 2023-07-19 ENCOUNTER — Other Ambulatory Visit: Payer: Self-pay | Admitting: Family Medicine

## 2023-07-19 ENCOUNTER — Other Ambulatory Visit: Payer: Self-pay | Admitting: Internal Medicine

## 2023-07-27 ENCOUNTER — Other Ambulatory Visit: Payer: Self-pay

## 2023-07-27 ENCOUNTER — Ambulatory Visit: Payer: 59 | Admitting: Allergy & Immunology

## 2023-07-27 ENCOUNTER — Other Ambulatory Visit: Payer: Self-pay | Admitting: Family Medicine

## 2023-07-27 ENCOUNTER — Encounter: Payer: Self-pay | Admitting: Allergy & Immunology

## 2023-07-27 VITALS — BP 154/118 | HR 107 | Temp 97.7°F | Resp 18

## 2023-07-27 DIAGNOSIS — L299 Pruritus, unspecified: Secondary | ICD-10-CM

## 2023-07-27 DIAGNOSIS — T781XXD Other adverse food reactions, not elsewhere classified, subsequent encounter: Secondary | ICD-10-CM | POA: Diagnosis not present

## 2023-07-27 DIAGNOSIS — J302 Other seasonal allergic rhinitis: Secondary | ICD-10-CM

## 2023-07-27 DIAGNOSIS — L2089 Other atopic dermatitis: Secondary | ICD-10-CM | POA: Diagnosis not present

## 2023-07-27 DIAGNOSIS — T781XXA Other adverse food reactions, not elsewhere classified, initial encounter: Secondary | ICD-10-CM

## 2023-07-27 DIAGNOSIS — J45990 Exercise induced bronchospasm: Secondary | ICD-10-CM | POA: Diagnosis not present

## 2023-07-27 DIAGNOSIS — T148XXA Other injury of unspecified body region, initial encounter: Secondary | ICD-10-CM

## 2023-07-27 DIAGNOSIS — J3089 Other allergic rhinitis: Secondary | ICD-10-CM

## 2023-07-27 NOTE — Patient Instructions (Addendum)
 1. Rash - I am going to try to get you on Rinvoq. - Samples provided today.  - Your insurance is going to make us  fail mycophenolate, methotrexate, cyclosporine , or azathioprine  before they approved one of the biologics.  - Unfortunately, these are unlikely to help and have more serious side effects than any of the treatments that I had you on before.  - We will try to get you on the patient assistance program through Rinvoq.  - Consent signed today.  - Continue with on clobetasol  shampoo twice daily as needed.  - Continue with triamcinolone  cream twice daily as needed (for the upper chest). - Tammy will be in touch with the approval process.  - We are ordering some labs today (complete blood count and clotting studies). - Bring the orders to your next appt with the [facility] and see if they can collect the blood at the same time (this will also ensure that we are not doubling up on your labs and causing an increase in cost).   2. Blacking out episodes - I would touch base with Dr. Norleen Blumenthal again to see if there is anything else that needs to be done. - I would definitely avoid alcohol for now until we figure it out. - Maybe you could do another ambulatory EEG and try drinking a glass of alcohol to see if you can recreate it.   3. Exercise induced bronchospasm - Lung testing not done today.  - Daily controller medication(s): NOTHING - Prior to physical activity: albuterol  2 puffs 10-15 minutes before physical activity. - Rescue medications: albuterol  4 puffs every 4-6 hours as needed - Asthma control goals:  * Full participation in all desired activities (may need albuterol  before activity) * Albuterol  use two time or less a week on average (not counting use with activity) * Cough interfering with sleep two time or less a month * Oral steroids no more than once a year * No hospitalizations  4. Seasonal and perennial allergic rhinitis (grass, dog) - Continue with cetirizine  10mg   daily. - Continue with levocetirizine 5 mg daily. - Continue with Flonase  one spray per nostril daily as needed.   5. Contact dermatitis - with sensitizations to p-tert butylphenol formaldehyde resin as well as Cl+ Me-Isothiazolinone and gold - Continue to avoid all of your triggering chemicals.  6. Return in about 3 months (around 10/25/2023). You can have the follow up appointment with Dr. Iva or a Nurse Practicioner (our Nurse Practitioners are excellent and always have Physician oversight!).    Please inform us  of any Emergency Department visits, hospitalizations, or changes in symptoms. Call us  before going to the ED for breathing or allergy  symptoms since we might be able to fit you in for a sick visit. Feel free to contact us  anytime with any questions, problems, or concerns.  It was a pleasure to see you again today!  Websites that have reliable patient information: 1. American Academy of Asthma, Allergy , and Immunology: www.aaaai.org 2. Food Allergy  Research and Education (FARE): foodallergy.org 3. Mothers of Asthmatics: http://www.asthmacommunitynetwork.org 4. [employer] of Allergy , Asthma, and Immunology: www.acaai.org      "Like" us  on Facebook and Instagram for our latest updates!      A healthy democracy works best when [employer] participate! Make sure you are registered to vote! If you have moved or changed any of your contact information, you will need to get this updated before voting! Scan the QR codes below to learn more!

## 2023-07-27 NOTE — Progress Notes (Signed)
 FOLLOW UP  Date of Service/Encounter:  07/27/23   Assessment:   Exercise-induced bronchospasm - with normal spirometry    Rash - previously improving with Dupixent  (starting Rinvoq today)  Perennial and seasonal allergic rhinitis (grass, dog)   Failed multiple topical medications including Eucrisa , Protopic , hydrocortisone, clobetasol , and triamcinolone    Contact dermatitis - with sensitizations to p-tert butylphenol formaldehyde resin as well as Cl+ Me-Isothiazolinone and gold   Hypercholesterolemia - now on a statin with improvement in numbers   Mild transaminitis - with a history of NASH  Currently receiving hormonal replacement therapy  New onset bruising - getting CBC and bleeding studies  Plan/Recommendations:   1. Rash - I am going to try to get you on Rinvoq. - Samples provided today.  - Your insurance is going to make us  fail mycophenolate, methotrexate, cyclosporine , or azathioprine  before they approved one of the biologics.  - Unfortunately, these are unlikely to help and have more serious side effects than any of the treatments that I had you on before.  - We will try to get you on the patient assistance program through Rinvoq.  - Consent signed today.  - Continue with on clobetasol  shampoo twice daily as needed.  - Continue with triamcinolone  cream twice daily as needed (for the upper chest). - [name] will be in touch with the approval process.  - We are ordering some labs today (complete blood count and clotting studies). - Bring the orders to your next appt with the [facility] and see if they can collect the blood at the same time (this will also ensure that we are not doubling up on your labs and causing an increase in cost).   2. Blacking out episodes - I would touch base with Dr. Norleen Li again to see if there is anything else that needs to be done. - I would definitely avoid alcohol for now until we figure it out. - Maybe you could do another  ambulatory EEG and try drinking a glass of alcohol to see if you can recreate it.   3. Exercise induced bronchospasm - Lung testing not done today.  - Daily controller medication(s): NOTHING - Prior to physical activity: albuterol  2 puffs 10-15 minutes before physical activity. - Rescue medications: albuterol  4 puffs every 4-6 hours as needed - Asthma control goals:  * Full participation in all desired activities (may need albuterol  before activity) * Albuterol  use two time or less a week on average (not counting use with activity) * Cough interfering with sleep two time or less a month * Oral steroids no more than once a year * No hospitalizations  4. Seasonal and perennial allergic rhinitis (grass, dog) - Continue with cetirizine  10mg  daily. - Continue with levocetirizine 5 mg daily. - Continue with Flonase  one spray per nostril daily as needed.   5. Contact dermatitis - with sensitizations to p-tert butylphenol formaldehyde resin as well as Cl+ Me-Isothiazolinone and gold - Continue to avoid all of your triggering chemicals.  6. Return in about 3 months (around 10/25/2023). You can have the follow up appointment with Dr. Iva or a Nurse Practicioner (our Nurse Practitioners are excellent and always have Physician oversight!).    Subjective:   [name] is a 53 y.o. female presenting today for follow up of  Chief Complaint  Patient presents with   Dermatitis    [name] has a history of the following: Patient Active Problem List   Diagnosis Date Noted  Alcohol use disorder, severe, dependence (HCC) 10/17/2022   Allergic contact dermatitis due to chemical 05/20/2022   Syncope 01/19/2022   Family history of colon cancer    Chronic diarrhea    Benign neoplasm of ascending colon    Benign neoplasm of transverse colon    Benign neoplasm of sigmoid colon    Allergic drug reaction 10/09/2020   Pruritic condition 10/09/2020   Vitamin D  deficiency  05/20/2020   Hypertension    Spastic bladder    Prediabetes 01/01/2020   Diastolic dysfunction 12/09/2019   DDD (degenerative disc disease), lumbar 11/13/2019   Spinal stenosis of lumbar region 09/20/2019   Numbness of hand 05/20/2019   Acquired hypothyroidism 07/13/2017   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   Gluten-sensitive enteropathy 07/13/2017   Lactose intolerance in adult 07/13/2017   History of decompression of median nerve 02/17/2017   Carpal tunnel syndrome 12/21/2016   Pseudoarthrosis of cervical spine (HCC) 07/04/2016   History of arthrodesis 08/25/2015   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 07/16/2015   Spinal cord disease (HCC) 07/10/2015   Cervical spondylosis with myelopathy 07/10/2015   DDD (degenerative disc disease), cervical 07/10/2015   Fibromyalgia 07/07/2015   Lumbar spondylosis 03/05/2014   Degeneration of lumbar intervertebral disc 03/05/2014   Paresthesia of lower extremity 02/04/2014   Obesity, unspecified 05/09/2013   Bilateral renal cysts 05/09/2013   Dyspareunia 02/25/2013   Mild intermittent asthma without complication 12/29/2012   Hyperglycemia 11/07/2012   Other malaise and fatigue 11/07/2012   Anxiety state 04/22/2010   GERD 04/22/2010   ILEUS 04/22/2010   IRRITABLE BOWEL SYNDROME 04/22/2010   Sleep apnea 04/22/2010   LIVER FUNCTION TESTS, ABNORMAL, HX OF 04/22/2010   Generalized anxiety disorder 04/22/2010   Hyperlipidemia 12/03/2009   Essential hypertension 12/03/2009   Liver cirrhosis secondary to NASH (HCC) 12/03/2009   CHEST PAIN UNSPECIFIED 12/03/2009    History obtained from: chart review and patient.  Discussed the use of AI scribe software for clinical note transcription with the patient and/or guardian, who gave verbal consent to proceed.  [name] is a 53 y.o. female presenting for a follow up visit.  She was last seen in October 2024.  At that time, we stopped her  Dupixent  because she was having these blackout reactions following her injections.  We talked about adding on Adbry instead.  Unfortunately, it does not seem that she has actually ever started it.  I talked to [name], and [name] said that the company has had trouble reaching the patient to talk about the drug assistance program.  The patient's insurance is 1 of those that requires multiple chemotherapeutic agents before they will approve Dupixent  another injection.  She did undergo testing to the entire food panel in April 2024.  This was all negative.  She also underwent testing to the environmental allergy  panel in May 2024.  This was positive to grass mix and dog.  Since last visit, she has been about the same.  Asthma/Respiratory Symptom History: She has not used her albuterol  in quite some time.  She has never been on a controller medication for her asthma.  Allergic Rhinitis Symptom History: The patient has also developed a new allergy  to dogs in the last couple of years, which causes her to itch. She has been managing this with Benadryl.  Long-acting antihistamines did not seem to help.  Skin Symptom History: She presents with concerns about a lack of healing.  However, physical exam shows more bruising than  anything else.  She reports that the rash has been present for a couple of months and has not improved despite the use of antibiotic ointment. The patient also mentions a recent incident where she experienced prolonged bleeding after a blood draw, which is unusual for her. She denies any history of bleeding issues.  Her rash on her chest has been about the same.    he was on Dupixent  for her rash but had to discontinue due to an adverse reaction. She reports episodes of blacking out after taking Dupixent , which have continued even after discontinuation of the medication. Despite extensive workup including EEG and Holter monitor, no cause for these episodes has been identified.  The patient also  mentions a history of alcohol consumption, specifically beer, wine, and coconut rum. She reports an incident on New Year's Eve where she blacked out and does not remember the events of the night.   Renise is awaiting a call back regarding starting Adbry for her rash. She has not started this medication yet. She has been using a cream for the rash, which she finds easier to use than the ointment.  The patient also reports starting hormone therapy (progesterone and estrogen) about a year ago for menopausal symptoms. She notes that this therapy has been helpful in reducing sweating. She started Progestin and Estrogen around one year ago. This is to help her with menopause.  This is all managed through [facility], which is a cash pay hormone clinic.   Otherwise, there have been no changes to her past medical history, surgical history, family history, or social history.    Review of systems otherwise negative other than that mentioned in the HPI.    Objective:   Blood pressure (!) 154/118, pulse (!) 107, temperature 97.7 F (36.5 C), temperature source Temporal, resp. rate 18, SpO2 96%. There is no height or weight on file to calculate BMI.    Physical Exam Vitals reviewed.  Constitutional:      Appearance: She is well-developed.  HENT:     Head: Normocephalic and atraumatic.     Right Ear: Tympanic membrane, ear canal and external ear normal.     Left Ear: Tympanic membrane, ear canal and external ear normal.     Nose: No nasal deformity, septal deviation, mucosal edema or rhinorrhea.     Right Turbinates: Enlarged, swollen and pale.     Left Turbinates: Enlarged, swollen and pale.     Right Sinus: No maxillary sinus tenderness or frontal sinus tenderness.     Left Sinus: No maxillary sinus tenderness or frontal sinus tenderness.     Mouth/Throat:     Mouth: Mucous membranes are not pale and not dry.     Pharynx: Uvula midline.  Eyes:     General: Lids are normal. No  allergic shiner.       Right eye: No discharge.        Left eye: No discharge.     Conjunctiva/sclera: Conjunctivae normal.     Right eye: Right conjunctiva is not injected. No chemosis.    Left eye: Left conjunctiva is not injected. No chemosis.    Pupils: Pupils are equal, round, and reactive to light.  Cardiovascular:     Rate and Rhythm: Normal rate and regular rhythm.     Heart sounds: Normal heart sounds.  Pulmonary:     Effort: Pulmonary effort is normal. No tachypnea, accessory muscle usage or respiratory distress.     Breath sounds: Normal breath  sounds. No wheezing, rhonchi or rales.     Comments: Moving air well in all lung fields Chest:     Chest wall: No tenderness.  Lymphadenopathy:     Cervical: No cervical adenopathy.  Skin:    General: Skin is warm.     Capillary Refill: Capillary refill takes less than 2 seconds.     Coloration: Skin is not pale.     Findings: Rash present. No abrasion or erythema. Rash is not papular, urticarial or vesicular.     Comments: Rash is slowly coming back. It appears like a sunburn once again on her upper chest and her arms.  The skin on her upper chest is more raised like it was when I first saw her.  Neurological:     Mental Status: She is alert.  Psychiatric:        Behavior: Behavior is cooperative.      Diagnostic studies: labs sent instead        Marty Shaggy, MD  [facility] 

## 2023-07-28 ENCOUNTER — Encounter: Payer: Self-pay | Admitting: Allergy & Immunology

## 2023-07-28 LAB — CBC WITH DIFFERENTIAL/PLATELET
Basophils Absolute: 0 10*3/uL (ref 0.0–0.2)
Basos: 0 %
EOS (ABSOLUTE): 0.1 10*3/uL (ref 0.0–0.4)
Eos: 1 %
Hematocrit: 41.4 % (ref 34.0–46.6)
Hemoglobin: 13.9 g/dL (ref 11.1–15.9)
Immature Grans (Abs): 0.1 10*3/uL (ref 0.0–0.1)
Immature Granulocytes: 1 %
Lymphocytes Absolute: 2.4 10*3/uL (ref 0.7–3.1)
Lymphs: 23 %
MCH: 32.9 pg (ref 26.6–33.0)
MCHC: 33.6 g/dL (ref 31.5–35.7)
MCV: 98 fL — ABNORMAL HIGH (ref 79–97)
Monocytes Absolute: 0.8 10*3/uL (ref 0.1–0.9)
Monocytes: 8 %
Neutrophils Absolute: 7.1 10*3/uL — ABNORMAL HIGH (ref 1.4–7.0)
Neutrophils: 67 %
Platelets: 363 10*3/uL (ref 150–450)
RBC: 4.22 x10E6/uL (ref 3.77–5.28)
RDW: 12.9 % (ref 11.7–15.4)
WBC: 10.6 10*3/uL (ref 3.4–10.8)

## 2023-07-28 LAB — PT AND PTT
INR: 1 (ref 0.9–1.2)
Prothrombin Time: 11 s (ref 9.1–12.0)
aPTT: 30 s (ref 24–33)

## 2023-07-31 ENCOUNTER — Encounter: Payer: Self-pay | Admitting: Allergy & Immunology

## 2023-08-01 ENCOUNTER — Ambulatory Visit: Payer: 59 | Admitting: Family Medicine

## 2023-08-08 ENCOUNTER — Other Ambulatory Visit: Payer: Self-pay | Admitting: Family Medicine

## 2023-08-08 LAB — BASIC METABOLIC PANEL
BUN/Creatinine Ratio: 19 (ref 9–23)
BUN: 18 mg/dL (ref 6–24)
CO2: 23 mmol/L (ref 20–29)
Calcium: 9 mg/dL (ref 8.7–10.2)
Chloride: 97 mmol/L (ref 96–106)
Creatinine, Ser: 0.95 mg/dL (ref 0.57–1.00)
Glucose: 96 mg/dL (ref 70–99)
Potassium: 4.7 mmol/L (ref 3.5–5.2)
Sodium: 137 mmol/L (ref 134–144)
eGFR: 71 mL/min/{1.73_m2} (ref >59)

## 2023-08-09 ENCOUNTER — Ambulatory Visit (INDEPENDENT_AMBULATORY_CARE_PROVIDER_SITE_OTHER): Payer: Managed Care, Other (non HMO) | Admitting: Family Medicine

## 2023-08-09 ENCOUNTER — Encounter: Payer: Self-pay | Admitting: Family Medicine

## 2023-08-09 VITALS — BP 137/89 | HR 112 | Temp 97.2°F | Ht 65.0 in | Wt 208.0 lb

## 2023-08-09 DIAGNOSIS — D539 Nutritional anemia, unspecified: Secondary | ICD-10-CM

## 2023-08-09 DIAGNOSIS — E119 Type 2 diabetes mellitus without complications: Secondary | ICD-10-CM

## 2023-08-09 DIAGNOSIS — K7581 Nonalcoholic steatohepatitis (NASH): Secondary | ICD-10-CM | POA: Diagnosis not present

## 2023-08-09 DIAGNOSIS — N390 Urinary tract infection, site not specified: Secondary | ICD-10-CM

## 2023-08-09 DIAGNOSIS — R350 Frequency of micturition: Secondary | ICD-10-CM

## 2023-08-09 DIAGNOSIS — Z7984 Long term (current) use of oral hypoglycemic drugs: Secondary | ICD-10-CM

## 2023-08-09 DIAGNOSIS — E1169 Type 2 diabetes mellitus with other specified complication: Secondary | ICD-10-CM

## 2023-08-09 DIAGNOSIS — R7989 Other specified abnormal findings of blood chemistry: Secondary | ICD-10-CM

## 2023-08-09 DIAGNOSIS — R404 Transient alteration of awareness: Secondary | ICD-10-CM

## 2023-08-09 DIAGNOSIS — E039 Hypothyroidism, unspecified: Secondary | ICD-10-CM

## 2023-08-09 DIAGNOSIS — D72829 Elevated white blood cell count, unspecified: Secondary | ICD-10-CM

## 2023-08-09 DIAGNOSIS — R252 Cramp and spasm: Secondary | ICD-10-CM

## 2023-08-09 DIAGNOSIS — E785 Hyperlipidemia, unspecified: Secondary | ICD-10-CM

## 2023-08-09 DIAGNOSIS — I10 Essential (primary) hypertension: Secondary | ICD-10-CM

## 2023-08-09 LAB — POCT URINALYSIS DIP (CLINITEK)
Blood, UA: NEGATIVE
Glucose, UA: NEGATIVE mg/dL
Ketones, POC UA: NEGATIVE mg/dL
Nitrite, UA: NEGATIVE
POC PROTEIN,UA: NEGATIVE
Spec Grav, UA: 1.01 (ref 1.010–1.025)
Urobilinogen, UA: 0.2 U/dL
pH, UA: 6 (ref 5.0–8.0)

## 2023-08-09 MED ORDER — METFORMIN HCL ER 500 MG PO TB24: ORAL_TABLET | ORAL | 1 refills | Status: DC

## 2023-08-09 MED ORDER — CLONAZEPAM 1 MG PO TABS: ORAL_TABLET | ORAL | 4 refills | Status: DC

## 2023-08-09 NOTE — Telephone Encounter (Signed)
Patient being seen by provider.

## 2023-08-09 NOTE — Progress Notes (Signed)
Subjective:    Patient ID: RAMIRA LY, female    DOB: [birth year], 53 y.o.   MRN: [id number]  HPI Discussed the use of AI scribe software for clinical note transcription with the patient, who gave verbal consent to proceed.  History of Present Illness   The patient presented with a chief complaint of persistent right-sided abdominal pain, which had previously led to a hospital visit. The pain was described as deep and localized to the right lower quadrant, radiating to the pelvic bone. The patient reported that the pain had been severe enough to prevent standing upright and had required morphine for relief. The pain had since improved but was still present and beginning to affect the left side as well.  In addition to the abdominal pain, the patient reported experiencing headaches, nausea, and migraines, which had begun prior to the Christmas holiday. The headaches were still ongoing and were managed with either ibuprofen or Excedrin Migraine. The patient also reported experiencing nightly fevers, chills, and sweats, which were initially attributed to menopause but were later associated with a fever.  The patient also reported experiencing cognitive changes, particularly in the evenings, characterized by memory loss and confusion. These episodes were severe enough to cause significant concern for the patient's partner. The patient also reported experiencing muscle cramps in the legs and feet, occurring daily and at any time.  The patient had a history of diabetes and was on metformin, which she was considering discontinuing due to potential side effects. The patient also reported a history of polyps, some of which were precancerous, and was due for a colonoscopy in 2028. The patient had a complex cyst on the left kidney, which had been stable in size over the past few years.  The patient's lifestyle habits included a recent return to a healthier diet and cessation of alcohol consumption.  The patient had a history of alcohol use but had stopped due to concerns about its potential impact on her health. The patient was also taking clonazepam, which was due for a refill.        Review of Systems     Objective:   Physical Exam General-in no acute distress Eyes-no discharge Lungs-respiratory rate normal, CTA CV-no murmurs,RRR Extremities skin warm dry no edema Neuro grossly normal Behavior normal, alert Abdomen soft no masses tenderness Hips good range of motion       Assessment & Plan:  Assessment and Plan    Abdominal Pain Severe right-sided abdominal pain, radiating to the pelvis. Pain has improved but persists. No clear etiology identified on CT scan. -Continue symptomatic management with ibuprofen or Excedrin as needed.  Possible Urinary Tract Infection Reports of urinary pressure and odor. Urinalysis showed few white blood cells, but dipstick was negative. -Culture urine and initiate antibiotics if culture is positive.  Cognitive Changes Reports of cognitive changes, particularly in the evenings. No clear etiology identified. -Consider MRI of the brain if cognitive changes persist after reducing Metformin dose.  Muscle Cramps Daily muscle cramps in legs and feet. -Check magnesium and thyroid function. -Advise stretching exercises 2-3 times a day.  Metformin Side Effects Possible side effects from Metformin, including skin changes and cognitive changes. -Reduce Metformin dose to 500mg  once daily. -Check [name], folic acid level, B12 level, and kidney function.  Colon Polyps History of precancerous tubular adenomas. -Continue regular colonoscopy screenings, next due in 2028.  Follow-up in 6-7 weeks. Complete blood work within the next 7-10 days, including cholesterol, liver function, CBC, [name], folic  acid level, B12 level, magnesium, kidney function, sodium, potassium, and thyroid function.      1. Urinary frequency (Primary) Urine at the hospital  did not show infection but today does have a few WBCs therefore we will do a culture - POCT URINALYSIS DIP (CLINITEK) - Urine Culture  2. Acquired hypothyroidism Because hypothyroidism continue medication but check lab work - TSH + free T4  3. Essential hypertension Blood pressure under decent control continue current measures  4. NASH (nonalcoholic steatohepatitis) Patient encouraged strongly to stay away from alcohol she has stayed away from alcohol recently and trying to work hard on doing so - Hepatic Function Panel  5. Elevated serum creatinine Repeat kidney functions - Hepatic Function Panel - Magnesium - Basic Metabolic Panel  6. Muscle cramps Stretches were shown lab work ordered  7. Macrocytic anemia Macrocytic anemia on previous lab work through the hospital also with elevated WBC therefore repeat to look at the WBC but also look at folate and B12 - Folate - Vitamin B12 - CBC with Differential  8. Episodic altered awareness Patient is having unusual spells more often in the evening where she does things and does not remember them or acts like she is out of it.  Previously months ago this was related more so on the evening where she may have a drink of alcohol recently she has not been having any alcohol yet but is having some of the spells no seizure activity occurs with this does not happen during the day but she does relate that her processing skills and memory skills are not as good as they have been.  Need to rule out the possibility of mini strokes.  May need consultation with neurology - Magnesium - Basic Metabolic Panel - MR Brain W Wo Contrast  9. Type 2 diabetes mellitus without complication, without long-term current use of insulin (HCC) Previous [name] under decent control check lab work - Hemoglobin [name]  10. Frequent UTI Check culture - Urine Culture  11. Leukocytosis, unspecified type Check CBC await the results - Lipid Panel With LDL/HDL Ratio -  Urine Culture  12. Hyperlipidemia, unspecified hyperlipidemia type Check labs await the results  Patient has been on clonazepam for years unlikely to be causing her cognitive issues Patient was strongly encouraged to stay totally away from alcohol

## 2023-08-13 LAB — URINE CULTURE

## 2023-08-14 ENCOUNTER — Encounter: Payer: Self-pay | Admitting: Family Medicine

## 2023-08-14 ENCOUNTER — Other Ambulatory Visit: Payer: Self-pay | Admitting: Family Medicine

## 2023-08-14 MED ORDER — NITROFURANTOIN MONOHYD MACRO 100 MG PO CAPS
100.0000 mg | ORAL_CAPSULE | Freq: Two times a day (BID) | ORAL | 0 refills | Status: DC
Start: 2023-08-14 — End: 2023-08-29

## 2023-08-15 ENCOUNTER — Encounter: Payer: Self-pay | Admitting: Family Medicine

## 2023-08-16 ENCOUNTER — Other Ambulatory Visit: Payer: Self-pay

## 2023-08-16 DIAGNOSIS — R300 Dysuria: Secondary | ICD-10-CM

## 2023-08-16 MED ORDER — PHENAZOPYRIDINE HCL 100 MG PO TABS: 100.0000 mg | ORAL_TABLET | Freq: Three times a day (TID) | ORAL | 0 refills | Status: AC | PRN

## 2023-08-16 NOTE — Telephone Encounter (Signed)
Nurses-May send Pyridium 100 mg 1 taken 3 times daily as needed for dysuria, #12  If any ongoing troubles let us know thank you

## 2023-08-21 ENCOUNTER — Ambulatory Visit (HOSPITAL_COMMUNITY)
Admission: RE | Admit: 2023-08-21 | Discharge: 2023-08-21 | Disposition: A | Discharge status: Home / Self Care | Payer: Managed Care, Other (non HMO) | Source: Ambulatory Visit | Attending: Family Medicine | Admitting: Family Medicine

## 2023-08-21 ENCOUNTER — Other Ambulatory Visit: Payer: Self-pay

## 2023-08-21 ENCOUNTER — Encounter: Payer: Self-pay | Admitting: Family Medicine

## 2023-08-21 ENCOUNTER — Other Ambulatory Visit: Payer: Self-pay | Admitting: Family Medicine

## 2023-08-21 DIAGNOSIS — N390 Urinary tract infection, site not specified: Secondary | ICD-10-CM

## 2023-08-21 DIAGNOSIS — R404 Transient alteration of awareness: Secondary | ICD-10-CM | POA: Diagnosis present

## 2023-08-21 MED ORDER — ONDANSETRON HCL 8 MG PO TABS: 8.0000 mg | ORAL_TABLET | Freq: Three times a day (TID) | ORAL | 0 refills | Status: AC | PRN

## 2023-08-21 MED ORDER — CEFDINIR 300 MG PO CAPS: 300.0000 mg | ORAL_CAPSULE | Freq: Two times a day (BID) | ORAL | 0 refills | Status: DC

## 2023-08-21 MED ORDER — GADOBUTROL 1 MMOL/ML IV SOLN
9.0000 mL | Freq: Once | INTRAVENOUS | Status: AC | PRN
  Administered 2023-08-21: 9 mL via INTRAVENOUS

## 2023-08-21 MED ORDER — LEVOFLOXACIN 500 MG PO TABS: ORAL_TABLET | ORAL | 0 refills | Status: DC

## 2023-08-21 NOTE — Telephone Encounter (Signed)
Levaquin 500 mg 1 p.o. daily for 5 days  May use Zofran tablet 8 mg 1 every 8 hours as needed nausea, #15, 1 refill  Try taking the antibiotic with a snack if having ongoing troubles worsening issues or problems let us know

## 2023-08-21 NOTE — Telephone Encounter (Signed)
Nurses Based on previous sensitivities I would recommend cefdinir 300 mg 1 twice daily for 10 days  Should show signs of improvement over the next 72 hours   if ongoing troubles or worse such as fever severe pain or persistent illness would need to be seen

## 2023-08-28 ENCOUNTER — Encounter: Payer: Self-pay | Admitting: Family Medicine

## 2023-08-29 ENCOUNTER — Encounter: Payer: Self-pay | Admitting: Family Medicine

## 2023-08-29 ENCOUNTER — Other Ambulatory Visit: Payer: Self-pay | Admitting: Family Medicine

## 2023-08-29 DIAGNOSIS — R197 Diarrhea, unspecified: Secondary | ICD-10-CM

## 2023-08-29 MED ORDER — VANCOMYCIN HCL 125 MG PO CAPS: 125.0000 mg | ORAL_CAPSULE | Freq: Four times a day (QID) | ORAL | 0 refills | Status: AC

## 2023-08-29 NOTE — Telephone Encounter (Signed)
 Did speak with the patient and she is willing to go ahead with the stool collection but wasn't sure about the turn around time, and with her abdominal pain and some nausea she would also like to have the medication [facility] [place]. She will pick up her containers from her local [facility] and proceed with the stool collection.

## 2023-08-29 NOTE — Telephone Encounter (Signed)
 Nurses Prescription for vancomycin  was sent to the pharmacy [facility] [address] [place] 1 taken 4 times daily for 10 days It is important to hold off on starting the medicine until she has collected the stool specimen Once she has collected stool specimens she may start the medication thank you  Certainly if high fevers bloody stools and dramatically worse in some cases emergency department is necessary Keep us  posted Thank you-Dr. Glendia Lanius may share message with patient

## 2023-08-29 NOTE — Telephone Encounter (Signed)
 Nurses In the situations we prefer to do a stool specimen for C. difficile-that is so that we are more certain that the diagnosis C. difficile toxin A and B with reflex-[facility] test 156  If patient finds it impossible to do stool test through [facility] then I would consider treating presumptively with 1 round of antibiotics.  Please connect with patient-if she can do the stool test that would be the best approach first and once result comes back and we can move forward  If she feels the stool test cannot be done please let me know and if we need to treat presumptively we will

## 2023-08-31 ENCOUNTER — Encounter: Payer: Self-pay | Admitting: Family Medicine

## 2023-09-03 ENCOUNTER — Encounter: Payer: Self-pay | Admitting: Family Medicine

## 2023-09-03 LAB — C DIFFICILE TOXINS A+B W/RFLX: C difficile Toxins A+B, EIA: NEGATIVE

## 2023-09-03 LAB — C DIFFICILE, CYTOTOXIN B

## 2023-09-05 ENCOUNTER — Ambulatory Visit: Payer: Managed Care, Other (non HMO) | Admitting: Obstetrics & Gynecology

## 2023-09-11 ENCOUNTER — Encounter: Payer: Self-pay | Admitting: Family Medicine

## 2023-09-15 ENCOUNTER — Ambulatory Visit: Payer: 59 | Admitting: Family Medicine

## 2023-09-16 ENCOUNTER — Other Ambulatory Visit: Payer: Self-pay | Admitting: Urology

## 2023-09-17 ENCOUNTER — Other Ambulatory Visit: Payer: Self-pay | Admitting: Internal Medicine

## 2023-09-17 ENCOUNTER — Other Ambulatory Visit: Payer: Self-pay | Admitting: Family Medicine

## 2023-09-18 ENCOUNTER — Other Ambulatory Visit: Payer: Self-pay | Admitting: Allergy & Immunology

## 2023-09-20 ENCOUNTER — Encounter: Payer: Self-pay | Admitting: Urology

## 2023-09-20 ENCOUNTER — Ambulatory Visit: Payer: Managed Care, Other (non HMO) | Admitting: Family Medicine

## 2023-09-20 ENCOUNTER — Ambulatory Visit (INDEPENDENT_AMBULATORY_CARE_PROVIDER_SITE_OTHER): Payer: Managed Care, Other (non HMO) | Admitting: Urology

## 2023-09-20 VITALS — BP 140/91 | HR 97 | Ht 65.0 in | Wt 195.0 lb

## 2023-09-20 DIAGNOSIS — Z87440 Personal history of urinary (tract) infections: Secondary | ICD-10-CM

## 2023-09-20 DIAGNOSIS — N281 Cyst of kidney, acquired: Secondary | ICD-10-CM

## 2023-09-20 DIAGNOSIS — N390 Urinary tract infection, site not specified: Secondary | ICD-10-CM

## 2023-09-20 DIAGNOSIS — N2889 Other specified disorders of kidney and ureter: Secondary | ICD-10-CM

## 2023-09-20 LAB — URINALYSIS
Bilirubin, UA: NEGATIVE
Glucose, UA: NEGATIVE
Ketones, UA: NEGATIVE
Leukocytes,UA: NEGATIVE
Nitrite, UA: NEGATIVE
Protein,UA: NEGATIVE
RBC, UA: NEGATIVE
Specific Gravity, UA: 1.015 (ref 1.005–1.030)
Urobilinogen, Ur: 0.2 mg/dL (ref 0.2–1.0)
pH, UA: 6 (ref 5.0–7.5)

## 2023-09-20 NOTE — Progress Notes (Signed)
 Assessment: 1. Recurrent UTI   2. Complex renal cyst     Plan: Continue current UTI prevention strategy with vaginal estrogen cream, lactobacillus probiotic, and postcoital prophylaxis. Follow-up 3 to 4 months for recheck.  Consider additional OAB medication. MRI abd prior to visit to assess left renal complex cyst  Chief Complaint: Recurrent uti  HPI: [name] is a 53 y.o. female who presents for continued evaluation of recurrent UTI. See my note 12/08/2022 at the time of initial visit for detailed history and exam. Patient continues to take vesicare 10mg  for oab (Rx by GYN).  Doing well from this standpoint.   She is also using UTI prevention strategies including urinary probiotic, cranberry suppl and vaginal estrogen cream and post coital bactrim.  She does note a temporal relationship between UTIs and sexual intercourse as well.   Last mo the patient had a citrobacter UTI.  OAB symptoms worse since UTI. UA today negative  Had CT a/p with contrast 08/03/23 at [facility]-- IMPRESSION:   1. Diffuse urinary bladder wall thickening, concerning for infectious or inflammatory cystitis.  2.  Tiny locules of gas within the urinary bladder, which can be seen with recent instrumentation/catheterization if there is recent history of such. Otherwise, gas forming infection is a possibility.  3.  Nonobstructing left renal calculi. No hydronephrosis.  4.  Lesion measuring 1.7 cm within the interpolar region of the left kidney. While possibly a complicated renal cyst, nonemergent outpatient renal MRI is suggested for further evaluation to exclude renal neoplasm.  5.  Ancillary and chronic findings successful.   Portions of the above documentation were copied from a prior visit for review purposes only.  Allergies: Allergies  Allergen Reactions   Cefdinir Itching   Dairy Aid [Tilactase]     Unsure of reaction.    Dog Epithelium Itching   Gluten Meal Other (See Comments)    Unsure  of reaction.   Grass Pollen(K-O-R-T-Swt Vern) Itching    migraine   Levaquin [Levofloxacin In D5w] Diarrhea and Nausea And Vomiting   Other     PT STATES NO BLOOD PRODUCTS OR BLOOD TRANSFUSIONS    Topamax [Topiramate]     Causes anger   Wheat Other (See Comments)    Unsure of reaction.    PMH: Past Medical History:  Diagnosis Date   Allergy    Anemia    hx   Anxiety    takes Klonopin daily as needed.takes Celexa daily   Arthritis    left foot and hands   Asthma    exercise induced   Back pain    Bilateral swelling of feet    Complication of anesthesia    slow to wake  up   Diabetes mellitus without complication (HCC)    Diastolic dysfunction 11/2019   per patient    Granuloma annulare    Herpes infection    High cholesterol    takes Pravastatin daily   History of bronchitis    several yrs ago   History of migraine    last one about 2 wks ago   Hypertension    IBS (irritable bowel syndrome)    takes Trulance and ProBiotic Daily   Inflammation of hair follicles    takes Minocycline daily   Insomnia    takes Melatonin nightly   Joint pain    Lactose intolerance    Liver fibrosis    Menopausal state    Migraine    NASH (nonalcoholic steatohepatitis)    Prediabetes  01/01/2020   Sleep apnea    sleep study > 5 yrs ago. no CPAP use    Sleep apnea    Spastic bladder    takes Myrbetriq daily   Tingling    r/t neck. Pseudoarthrosis    Tubular adenoma of colon    UTI (urinary tract infection)    hx of but takes Macrodantin daily. has been on for 2 1/2 yrs     PSH: Past Surgical History:  Procedure Laterality Date   ABDOMINAL HYSTERECTOMY  2005   complete hysterectomy both ovaries removed   BIOPSY  10/14/2021   Procedure: BIOPSY;  Surgeon: Beverley Fiedler, MD;  Location: [facility];  Service: Gastroenterology;;   BREAST REDUCTION SURGERY  2009   CARPAL TUNNEL RELEASE     carpel tunnel Bilateral 02/2017, 2021   CERVICAL DISC SURGERY  2007, 2009, 2014, 2017    anterior  x 3 , posterior x 2    CESAREAN SECTION  94/03   x 2   COLONOSCOPY     COLONOSCOPY WITH PROPOFOL N/A 10/14/2021   Procedure: COLONOSCOPY WITH PROPOFOL;  Surgeon: Beverley Fiedler, MD;  Location: [facility];  Service: Gastroenterology;  Laterality: N/A;   ESOPHAGOGASTRODUODENOSCOPY     POLYPECTOMY  10/14/2021   Procedure: POLYPECTOMY;  Surgeon: Beverley Fiedler, MD;  Location: [facility];  Service: Gastroenterology;;   POSTERIOR CERVICAL FUSION/FORAMINOTOMY N/A 01/07/2013   Procedure: POSTERIOR CERVICAL FUSION/FORAMINOTOMY LEVEL 2;  Surgeon: Hewitt Shorts, MD;  Location: [facility];  Service: Neurosurgery;  Laterality: N/A;  Posterior Cervical Five-Six/Six-Seven Arthrodesis with Instrumentation   POSTERIOR CERVICAL FUSION/FORAMINOTOMY N/A 07/04/2016   Procedure: CERVICAL THREE-FOUR POSTERIOR CERVICAL ARTHRODESIS;  Surgeon: Shirlean Kelly, MD;  Location: [facility];  Service: Neurosurgery;  Laterality: N/A;  C3-C4 POSTERIOR CERVICAL ARTHRODESIS   tummy tuck     TYMPANOSTOMY TUBE PLACEMENT Bilateral    As a baby   UPPER GASTROINTESTINAL ENDOSCOPY      SH: Social History   Tobacco Use   Smoking status: Never   Smokeless tobacco: Never  Vaping Use   Vaping status: Never Used  Substance Use Topics   Alcohol use: Yes    Alcohol/week: 1.0 standard drink of alcohol    Types: 1 Glasses of wine per week    Comment: occasional   Drug use: No    ROS: Constitutional:  Negative for fever, chills, weight loss CV: Negative for chest pain, previous MI, hypertension Respiratory:  Negative for shortness of breath, wheezing, sleep apnea, frequent cough GI:  Negative for nausea, vomiting, bloody stool, GERD  PE: BP (!) 140/91   Pulse 97   Ht 5\' 5"  (1.651 m)   Wt 195 lb (88.5 kg)   BMI 32.45 kg/m  GENERAL APPEARANCE:  Well appearing, well developed, well nourished, NAD    Results: UA neg

## 2023-09-22 ENCOUNTER — Encounter: Payer: Self-pay | Admitting: Family Medicine

## 2023-09-22 ENCOUNTER — Other Ambulatory Visit: Payer: Self-pay | Admitting: Family Medicine

## 2023-09-22 MED ORDER — NIRMATRELVIR/RITONAVIR (PAXLOVID)TABLET: 3.0000 | ORAL_TABLET | Freq: Two times a day (BID) | ORAL | 0 refills | Status: AC

## 2023-09-22 NOTE — Telephone Encounter (Signed)
 Patient She is having headache sore throat cough head congestion no wheezing or shortness of breath currently Warning signs were discussed in detail Her GFR is good Paxlovid was sent in with instructions Patient aware to not take Crestor or Vesicare while on this medicine She has a follow-up on Wednesday

## 2023-09-27 ENCOUNTER — Encounter: Payer: Self-pay | Admitting: Family Medicine

## 2023-09-27 ENCOUNTER — Ambulatory Visit: Payer: Managed Care, Other (non HMO) | Admitting: Family Medicine

## 2023-09-27 VITALS — BP 132/94 | HR 97 | Temp 98.2°F | Ht 65.0 in | Wt 197.8 lb

## 2023-09-27 DIAGNOSIS — E039 Hypothyroidism, unspecified: Secondary | ICD-10-CM

## 2023-09-27 DIAGNOSIS — K7581 Nonalcoholic steatohepatitis (NASH): Secondary | ICD-10-CM | POA: Diagnosis not present

## 2023-09-27 DIAGNOSIS — R300 Dysuria: Secondary | ICD-10-CM | POA: Diagnosis not present

## 2023-09-27 DIAGNOSIS — F1011 Alcohol abuse, in remission: Secondary | ICD-10-CM

## 2023-09-27 DIAGNOSIS — E119 Type 2 diabetes mellitus without complications: Secondary | ICD-10-CM

## 2023-09-27 DIAGNOSIS — E785 Hyperlipidemia, unspecified: Secondary | ICD-10-CM

## 2023-09-27 DIAGNOSIS — E1169 Type 2 diabetes mellitus with other specified complication: Secondary | ICD-10-CM

## 2023-09-27 DIAGNOSIS — F411 Generalized anxiety disorder: Secondary | ICD-10-CM

## 2023-09-27 DIAGNOSIS — I10 Essential (primary) hypertension: Secondary | ICD-10-CM | POA: Diagnosis not present

## 2023-09-27 LAB — POCT URINALYSIS DIP (CLINITEK)
Bilirubin, UA: NEGATIVE
Blood, UA: NEGATIVE
Glucose, UA: NEGATIVE mg/dL
Ketones, POC UA: NEGATIVE mg/dL
Leukocytes, UA: NEGATIVE
Nitrite, UA: NEGATIVE
POC PROTEIN,UA: NEGATIVE
Spec Grav, UA: <=1.005 — AB (ref 1.010–1.025)
Urobilinogen, UA: 0.2 U/dL — NL
pH, UA: 7 (ref 5.0–8.0)

## 2023-09-27 NOTE — Progress Notes (Signed)
   Subjective:    Patient ID: [name], female    DOB: 1971-02-02, 53 y.o.   MRN: [id number]  HPI Dysuria - Plan: POCT URINALYSIS DIP (CLINITEK)  Essential hypertension  NASH (nonalcoholic steatohepatitis)  Acquired hypothyroidism  Type 2 diabetes mellitus without complication, without long-term current use of insulin (HCC)  Alcohol use disorder, mild, in early remission  Hyperlipidemia associated with type 2 diabetes mellitus (HCC)  Patient here for follow-up regarding cholesterol.    Patient relates taking medication on a regular basis Denies problems with medication Importance of dietary measures discussed Regular lab work regarding lipid and liver was checked and if needing additional labs was appropriately ordered  Patient for blood pressure check up.  The patient does have hypertension.   Patient relates dietary measures try to minimize salt The importance of healthy diet and activity were discussed Patient relates compliance  The patient was seen today as part of a comprehensive diabetic check up. Patient has diabetes Patient relates good compliance with taking the medication. We discussed their diet and exercise activities  We also discussed the importance of notifying us if any excessively high glucoses or low sugars.    Patient also dealing with obesity and elevated liver enzymes with NASH  Patient is trying her best stay away from alcohol is only had a little bit of wine at the start of January otherwise to stay away from alcohol  Has underlying anxiety and depression under decent control currently but uses clonazepam twice daily she has been on this for a long span of time and it allows her to stay functioning/calm we will continue this medicine she states it does not cause drowsiness.  Review of Systems     Objective:   Physical Exam General-in no acute distress Eyes-no discharge Lungs-respiratory rate normal, CTA CV-no  murmurs,RRR Extremities skin warm dry no edema Neuro grossly normal Behavior normal, alert        Assessment & Plan:   1. Dysuria (Primary) Urine under the microscope did not show any obvious infection  - POCT URINALYSIS DIP (CLINITEK)  2. Essential hypertension HTN continue medication watch diet resume medications currently being treated for COVID no sign of COVID complications  3. NASH (nonalcoholic steatohepatitis) Patient to have follow-up lab work we will see what the liver enzymes are showing  4. Acquired hypothyroidism Continue medication check lab work await results  5. Type 2 diabetes mellitus without complication, without long-term current use of insulin (HCC) Continue indication healthy diet check lab work await results  6. Alcohol use disorder, mild, in early remission Patient doing a good job staying away from alcohol  7. Hyperlipidemia associated with type 2 diabetes mellitus (HCC) Continue medication check lab work await results  8. GAD (generalized anxiety disorder) Continue Celexa also continue clonazepam twice daily  Target follow-up 6 months

## 2023-10-05 NOTE — Telephone Encounter (Signed)
 Spoke with patient and she does not currently have any urinary symptoms , but will notify us if she does to then proceed with drs recommendations.

## 2023-10-05 NOTE — Telephone Encounter (Signed)
 Nurses With a urine looking normal under microscopic I did not send it for culture. Certainly if she is having symptoms of burning discomfort and not feeling good in regards to bladder symptoms Then this could be treated with Bactrim DS 1 twice daily for 3 days  If she would like to resubmit a urine to send for culture we can do so as well thank you

## 2023-10-10 ENCOUNTER — Other Ambulatory Visit: Payer: Self-pay | Admitting: Family Medicine

## 2023-10-10 LAB — LIPID PANEL WITH LDL/HDL RATIO
Cholesterol, Total: 238 mg/dL — ABNORMAL HIGH (ref 100–199)
HDL: 40 mg/dL (ref >39)
LDL Chol Calc (NIH): 146 mg/dL — ABNORMAL HIGH (ref 0–99)
LDL/HDL Ratio: 3.7 ratio — ABNORMAL HIGH (ref 0.0–3.2)
Triglycerides: 284 mg/dL — ABNORMAL HIGH (ref 0–149)
VLDL Cholesterol Cal: 52 mg/dL — ABNORMAL HIGH (ref 5–40)

## 2023-10-10 LAB — HEPATIC FUNCTION PANEL
ALT: 22 IU/L (ref 0–32)
AST: 23 IU/L (ref 0–40)
Albumin: 4.5 g/dL (ref 3.8–4.9)
Alkaline Phosphatase: 78 IU/L (ref 44–121)
Bilirubin Total: 0.4 mg/dL (ref 0.0–1.2)
Bilirubin, Direct: 0.12 mg/dL (ref 0.00–0.40)
Total Protein: 7 g/dL (ref 6.0–8.5)

## 2023-10-10 LAB — CBC WITH DIFFERENTIAL/PLATELET
Basophils Absolute: 0 10*3/uL (ref 0.0–0.2)
Basos: 0 %
EOS (ABSOLUTE): 0.1 10*3/uL (ref 0.0–0.4)
Eos: 1 %
Hematocrit: 43.9 % (ref 34.0–46.6)
Hemoglobin: 14.5 g/dL (ref 11.1–15.9)
Immature Grans (Abs): 0 10*3/uL (ref 0.0–0.1)
Immature Granulocytes: 0 %
Lymphocytes Absolute: 2.5 10*3/uL (ref 0.7–3.1)
Lymphs: 35 %
MCH: 31.7 pg (ref 26.6–33.0)
MCHC: 33 g/dL (ref 31.5–35.7)
MCV: 96 fL (ref 79–97)
Monocytes Absolute: 0.6 10*3/uL (ref 0.1–0.9)
Monocytes: 9 %
Neutrophils Absolute: 4 10*3/uL (ref 1.4–7.0)
Neutrophils: 55 %
Platelets: 288 10*3/uL (ref 150–450)
RBC: 4.57 x10E6/uL (ref 3.77–5.28)
RDW: 12.1 % (ref 11.7–15.4)
WBC: 7.2 10*3/uL (ref 3.4–10.8)

## 2023-10-10 LAB — FOLATE: Folate: 3.7 ng/mL (ref >3.0)

## 2023-10-10 LAB — TSH+FREE T4
Free T4: 0.8 ng/dL — ABNORMAL LOW (ref 0.82–1.77)
TSH: 1.5 u[IU]/mL (ref 0.450–4.500)

## 2023-10-10 LAB — BASIC METABOLIC PANEL
BUN/Creatinine Ratio: 11 (ref 9–23)
BUN: 11 mg/dL (ref 6–24)
CO2: 25 mmol/L (ref 20–29)
Calcium: 9.9 mg/dL (ref 8.7–10.2)
Chloride: 102 mmol/L (ref 96–106)
Creatinine, Ser: 0.98 mg/dL (ref 0.57–1.00)
Glucose: 107 mg/dL — ABNORMAL HIGH (ref 70–99)
Potassium: 5.1 mmol/L (ref 3.5–5.2)
Sodium: 140 mmol/L (ref 134–144)
eGFR: 69 mL/min/{1.73_m2} (ref >59)

## 2023-10-10 LAB — HEMOGLOBIN A1C
Est. average glucose Bld gHb Est-mCnc: 123 mg/dL
Hgb A1c MFr Bld: 5.9 % — ABNORMAL HIGH (ref 4.8–5.6)

## 2023-10-10 LAB — VITAMIN B12: Vitamin B-12: 872 pg/mL (ref 232–1245)

## 2023-10-10 LAB — MAGNESIUM: Magnesium: 2.1 mg/dL (ref 1.6–2.3)

## 2023-10-17 ENCOUNTER — Other Ambulatory Visit: Payer: Self-pay | Admitting: Allergy & Immunology

## 2023-10-26 ENCOUNTER — Encounter: Payer: Self-pay | Admitting: Allergy & Immunology

## 2023-10-26 ENCOUNTER — Other Ambulatory Visit: Payer: Self-pay

## 2023-10-26 ENCOUNTER — Ambulatory Visit (INDEPENDENT_AMBULATORY_CARE_PROVIDER_SITE_OTHER): Payer: 59 | Admitting: Allergy & Immunology

## 2023-10-26 VITALS — BP 126/88 | HR 86 | Temp 98.3°F | Wt 199.8 lb

## 2023-10-26 DIAGNOSIS — L2089 Other atopic dermatitis: Secondary | ICD-10-CM | POA: Diagnosis not present

## 2023-10-26 DIAGNOSIS — J3089 Other allergic rhinitis: Secondary | ICD-10-CM | POA: Diagnosis not present

## 2023-10-26 DIAGNOSIS — J45990 Exercise induced bronchospasm: Secondary | ICD-10-CM | POA: Diagnosis not present

## 2023-10-26 DIAGNOSIS — L299 Pruritus, unspecified: Secondary | ICD-10-CM

## 2023-10-26 DIAGNOSIS — J302 Other seasonal allergic rhinitis: Secondary | ICD-10-CM

## 2023-10-26 DIAGNOSIS — L2389 Allergic contact dermatitis due to other agents: Secondary | ICD-10-CM

## 2023-10-26 MED ORDER — CYCLOSPORINE MODIFIED (NEORAL) 100 MG PO CAPS: 100.0000 mg | ORAL_CAPSULE | Freq: Two times a day (BID) | ORAL | 0 refills | Status: DC

## 2023-10-26 NOTE — Patient Instructions (Addendum)
 1. Rash - It looks like we have to fail TWO of these medications: mycophenolate, methotrexate, cyclosporine, or azathioprine - Start cyclosporine 100mg  twice daily and continue for two weeks. - We will touch base in two weeks and see how you are doing with it. - Continue with on clobetasol shampoo twice daily as needed.  - Continue with triamcinolone cream twice daily as needed (for the upper chest).  2. Blacking out episodes - I would touch base with Dr. Trena Platt.  - I would definitely avoid alcohol for now until we figure it out.  3. Exercise induced bronchospasm - Lung testing not done today.  - Daily controller medication(s): NOTHING - Prior to physical activity: albuterol 2 puffs 10-15 minutes before physical activity. - Rescue medications: albuterol 4 puffs every 4-6 hours as needed - Asthma control goals:  * Full participation in all desired activities (may need albuterol before activity) * Albuterol use two time or less a week on average (not counting use with activity) * Cough interfering with sleep two time or less a month * Oral steroids no more than once a year * No hospitalizations  4. Seasonal and perennial allergic rhinitis (grass, dog) - Continue with cetirizine 10mg  daily. - Continue with levocetirizine 5 mg daily. - Continue with Flonase one spray per nostril daily as needed.   5. Contact dermatitis - with sensitizations to p-tert butylphenol formaldehyde resin as well as Cl+ Me-Isothiazolinone and gold - Continue to avoid all of your triggering chemicals.  6. Return in about 2 weeks (around 11/09/2023). You can have the follow up appointment with Dr. Dellis Anes or a Nurse Practicioner (our Nurse Practitioners are excellent and always have Physician oversight!).    Please inform us of any Emergency Department visits, hospitalizations, or changes in symptoms. Call us before going to the ED for breathing or allergy symptoms since we might be able to fit you in for a  sick visit. Feel free to contact us anytime with any questions, problems, or concerns.  It was a pleasure to see you again today!  Websites that have reliable patient information: 1. American Academy of Asthma, Allergy, and Immunology: www.aaaai.org 2. Food Allergy Research and Education (FARE): foodallergy.org 3. Mothers of Asthmatics: http://www.asthmacommunitynetwork.org 4. American College of Allergy, Asthma, and Immunology: www.acaai.org      "Like" [place] on Facebook and Instagram for our latest updates!      A healthy democracy works best when [employer] participate! Make sure you are registered to vote! If you have moved or changed any of your contact information, you will need to get this updated before voting! Scan the QR codes below to learn more!

## 2023-10-26 NOTE — Progress Notes (Signed)
 FOLLOW UP  Date of Service/Encounter:  10/26/23   Assessment:   Exercise-induced bronchospasm - with normal spirometry    Rash - previously improving with Dupixent (starting Rinvoq today)   Perennial and seasonal allergic rhinitis (grass, dog)   Failed multiple topical medications including Eucrisa, Protopic, hydrocortisone, clobetasol, and triamcinolone   Contact dermatitis - with sensitizations to p-tert butylphenol formaldehyde resin as well as Cl+ Me-Isothiazolinone and gold   Hypercholesterolemia - now on a statin with improvement in numbers   Mild transaminitis - with a history of NASH   Currently receiving hormonal replacement therapy   New onset bruising - getting CBC and bleeding studies  Plan/Recommendations:   1. Rash - It looks like we have to fail TWO of these medications: mycophenolate, methotrexate, cyclosporine, or azathioprine - Start cyclosporine 100mg  twice daily and continue for two weeks. - We will touch base in two weeks and see how you are doing with it. - Continue with on clobetasol shampoo twice daily as needed.  - Continue with triamcinolone cream twice daily as needed (for the upper chest).  2. Blacking out episodes - I would touch base with Dr. Trena Platt.  - I would definitely avoid alcohol for now until we figure it out.  3. Exercise induced bronchospasm - Lung testing not done today.  - Daily controller medication(s): NOTHING - Prior to physical activity: albuterol 2 puffs 10-15 minutes before physical activity. - Rescue medications: albuterol 4 puffs every 4-6 hours as needed - Asthma control goals:  * Full participation in all desired activities (may need albuterol before activity) * Albuterol use two time or less a week on average (not counting use with activity) * Cough interfering with sleep two time or less a month * Oral steroids no more than once a year * No hospitalizations  4. Seasonal and perennial allergic rhinitis  (grass, dog) - Continue with cetirizine 10mg  daily. - Continue with levocetirizine 5 mg daily. - Continue with Flonase one spray per nostril daily as needed.   5. Contact dermatitis - with sensitizations to p-tert butylphenol formaldehyde resin as well as Cl+ Me-Isothiazolinone and gold - Continue to avoid all of your triggering chemicals.  6. Return in about 2 weeks (around 11/09/2023). You can have the follow up appointment with Dr. Dellis Anes or a Nurse Practicioner (our Nurse Practitioners are excellent and always have Physician oversight!).   Subjective:   [name] is a 53 y.o. female presenting today for follow up of  Chief Complaint  Patient presents with   Seasonal and perennial allergic rhinitis   Exercise induced bronchospasm   Allergic atopic dermatitis due to chemical   Food sensitivity with gastrointestinal symptoms   Follow-up    [name] has a history of the following: Patient Active Problem List   Diagnosis Date Noted   Alcohol use disorder, severe, dependence (HCC) 10/17/2022   Allergic contact dermatitis due to chemical 05/20/2022   Syncope 01/19/2022   Family history of colon cancer    Chronic diarrhea    Benign neoplasm of ascending colon    Benign neoplasm of transverse colon    Benign neoplasm of sigmoid colon    Allergic drug reaction 10/09/2020   Pruritic condition 10/09/2020   Vitamin D deficiency 05/20/2020   Hypertension    Spastic bladder    Prediabetes 01/01/2020   Diastolic dysfunction 12/09/2019   DDD (degenerative disc disease), lumbar 11/13/2019   Spinal stenosis of lumbar region 09/20/2019   Numbness of hand  05/20/2019   Acquired hypothyroidism 07/13/2017   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   Gluten-sensitive enteropathy 07/13/2017   Lactose intolerance in adult 07/13/2017   History of decompression of median nerve 02/17/2017   Carpal tunnel syndrome 12/21/2016   Pseudoarthrosis of  cervical spine (HCC) 07/04/2016   History of arthrodesis 08/25/2015   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 07/16/2015   Spinal cord disease (HCC) 07/10/2015   Cervical spondylosis with myelopathy 07/10/2015   DDD (degenerative disc disease), cervical 07/10/2015   Fibromyalgia 07/07/2015   Lumbar spondylosis 03/05/2014   Degeneration of lumbar intervertebral disc 03/05/2014   Paresthesia of lower extremity 02/04/2014   Obesity, unspecified 05/09/2013   Bilateral renal cysts 05/09/2013   Dyspareunia 02/25/2013   Mild intermittent asthma without complication 12/29/2012   Hyperglycemia 11/07/2012   Other malaise and fatigue 11/07/2012   Anxiety state 04/22/2010   GERD 04/22/2010   ILEUS 04/22/2010   IRRITABLE BOWEL SYNDROME 04/22/2010   Sleep apnea 04/22/2010   LIVER FUNCTION TESTS, ABNORMAL, HX OF 04/22/2010   Generalized anxiety disorder 04/22/2010   Hyperlipidemia associated with type 2 diabetes mellitus (HCC) 12/03/2009   Essential hypertension 12/03/2009   Liver cirrhosis secondary to NASH (HCC) 12/03/2009   CHEST PAIN UNSPECIFIED 12/03/2009    History obtained from: chart review and patient.  Discussed the use of AI scribe software for clinical note transcription with the patient and/or guardian, who gave verbal consent to proceed.  [name] is a 53 y.o. female presenting for a follow up visit.  She was last seen in January 2025.  At that time, we got her on red Volk after giving her some samples.  We continue with clobetasol shampoo twice daily as needed as well as triamcinolone twice daily as needed.  We did order some labs at the last visit which looked normal.  She had some blacking out episodes with Dupixent, which is why we changed to written Volk.  We did not do lung testing.  We continue with albuterol as needed.  For her allergic rhinitis, we continue with cetirizine, levocetirizine, and Flonase.  She continue to avoid all of her contact  dermatitis triggers.  Since last visit, she has done fairly well.   She describes redness on her arms and chest, which she suspects might be related to metformin use. She is in the process of weaning off metformin over the next two weeks. A dermatologist attributed the redness to a lack of collagen due to sun damage, but no biopsy was performed. She experiences prolonged bleeding and slow healing of wounds, requiring extended use of Band-Aids.  She has a history of using Dupixent, which was discontinued due to episodes of blacking out. Since stopping Dupixent and ceasing alcohol consumption in February, she has not experienced further blackouts. She has run out of Rinvoq, which she had been using for her dermatological condition. She received three bottles during her last visit, and while on it, she noted improvement in itching. However, she has experienced a few bumps that resolve spontaneously. She has not heard back from the office regarding a refill.  Her current medications include Xyzal, pimetidine, and Zyrtec for allergies, and she uses Flonase. She occasionally uses an albuterol inhaler, particularly during a recent COVID-19 infection when she experienced chest pain. She mentions experiencing COVID fatigue, which she hopes to address with B12 supplementation.  She works in [employer] and [place]. She has stopped consuming alcohol since  February, except for a recent occasion.  Otherwise, there have been no changes to her past medical history, surgical history, family history, or social history.    Review of systems otherwise negative other than that mentioned in the HPI.    Objective:   Blood pressure 126/88, pulse 86, temperature 98.3 F (36.8 C), temperature source Temporal, weight 199 lb 12.8 oz (90.6 kg), SpO2 97%. Body mass index is 33.25 kg/m.    Physical Exam Vitals reviewed.  Constitutional:      Appearance: She  is well-developed.  HENT:     Head: Normocephalic and atraumatic.     Right Ear: Tympanic membrane, ear canal and external ear normal.     Left Ear: Tympanic membrane, ear canal and external ear normal.     Nose: No nasal deformity, septal deviation, mucosal edema or rhinorrhea.     Right Turbinates: Enlarged, swollen and pale.     Left Turbinates: Enlarged, swollen and pale.     Right Sinus: No maxillary sinus tenderness or frontal sinus tenderness.     Left Sinus: No maxillary sinus tenderness or frontal sinus tenderness.     Comments: No nasal polyps noted.     Mouth/Throat:     Mouth: Mucous membranes are not pale and not dry.     Pharynx: Uvula midline.  Eyes:     General: Lids are normal. Allergic shiner present.        Right eye: No discharge.        Left eye: No discharge.     Conjunctiva/sclera: Conjunctivae normal.     Right eye: Right conjunctiva is not injected. No chemosis.    Left eye: Left conjunctiva is not injected. No chemosis.    Pupils: Pupils are equal, round, and reactive to light.  Cardiovascular:     Rate and Rhythm: Normal rate and regular rhythm.     Heart sounds: Normal heart sounds.  Pulmonary:     Effort: Pulmonary effort is normal. No tachypnea, accessory muscle usage or respiratory distress.     Breath sounds: Normal breath sounds. No wheezing, rhonchi or rales.     Comments: Moving air well in all lung fields. Chest:     Chest wall: No tenderness.  Lymphadenopathy:     Cervical: No cervical adenopathy.  Skin:    General: Skin is warm.     Capillary Refill: Capillary refill takes less than 2 seconds.     Coloration: Skin is not pale.     Findings: Rash present. No abrasion or erythema. Rash is not papular, urticarial or vesicular.     Comments: Rash is slowly coming back. It appears like a sunburn once again on her upper chest and her arms.  The skin on her upper chest is more raised like it was when I first saw her.  Neurological:     Mental  Status: She is alert.  Psychiatric:        Behavior: Behavior is cooperative.      Diagnostic studies: none    Malachi Bonds, MD  [facility]

## 2023-10-31 ENCOUNTER — Encounter: Payer: Self-pay | Admitting: Allergy & Immunology

## 2023-11-17 ENCOUNTER — Other Ambulatory Visit: Payer: Self-pay | Admitting: Allergy & Immunology

## 2023-11-21 ENCOUNTER — Encounter: Payer: Self-pay | Admitting: Allergy & Immunology

## 2023-11-21 ENCOUNTER — Other Ambulatory Visit: Payer: Self-pay | Admitting: Allergy & Immunology

## 2023-11-21 ENCOUNTER — Other Ambulatory Visit: Payer: Self-pay

## 2023-11-21 ENCOUNTER — Ambulatory Visit (INDEPENDENT_AMBULATORY_CARE_PROVIDER_SITE_OTHER): Admitting: Allergy & Immunology

## 2023-11-21 VITALS — BP 120/72 | HR 94 | Temp 97.7°F | Resp 19

## 2023-11-21 DIAGNOSIS — L2389 Allergic contact dermatitis due to other agents: Secondary | ICD-10-CM

## 2023-11-21 DIAGNOSIS — L299 Pruritus, unspecified: Secondary | ICD-10-CM | POA: Diagnosis not present

## 2023-11-21 DIAGNOSIS — J302 Other seasonal allergic rhinitis: Secondary | ICD-10-CM

## 2023-11-21 DIAGNOSIS — J3089 Other allergic rhinitis: Secondary | ICD-10-CM

## 2023-11-21 DIAGNOSIS — J45990 Exercise induced bronchospasm: Secondary | ICD-10-CM

## 2023-11-21 DIAGNOSIS — L2089 Other atopic dermatitis: Secondary | ICD-10-CM | POA: Diagnosis not present

## 2023-11-21 MED ORDER — LEVOCETIRIZINE DIHYDROCHLORIDE 5 MG PO TABS: 5.0000 mg | ORAL_TABLET | Freq: Every day | ORAL | 3 refills | Status: AC

## 2023-11-21 MED ORDER — TRIAMCINOLONE ACETONIDE 0.5 % EX OINT: 1.0000 | TOPICAL_OINTMENT | Freq: Two times a day (BID) | CUTANEOUS | 5 refills | Status: AC | PRN

## 2023-11-21 MED ORDER — AZATHIOPRINE 50 MG PO TABS
50.0000 mg | ORAL_TABLET | Freq: Every day | ORAL | 0 refills | Status: DC
Start: 2023-11-21 — End: 2023-12-14

## 2023-11-21 MED ORDER — FAMOTIDINE 20 MG PO TABS: 20.0000 mg | ORAL_TABLET | Freq: Two times a day (BID) | ORAL | 3 refills | Status: AC

## 2023-11-21 NOTE — Patient Instructions (Addendum)
 1. Rash - It looks like we have to fail TWO of these medications: mycophenolate, methotrexate, cyclosporine , or azathioprine - We have failed cyclosporine .  - Start azathioprine 50mg  once daily.  - Send a message or call in four weeks to let us  know if that worked. - Then we can get Rinvoq approved.  - Continue with on clobetasol  shampoo twice daily as needed.  - Continue with triamcinolone  cream twice daily as needed (for the upper chest).  2. Blacking out episodes - I would touch base with Dr. Mira Amend.  - I would definitely avoid alcohol for now until we figure it out.  3. Exercise induced bronchospasm - Lung testing not done today.  - Daily controller medication(s): NOTHING - Prior to physical activity: albuterol  2 puffs 10-15 minutes before physical activity. - Rescue medications: albuterol  4 puffs every 4-6 hours as needed - Asthma control goals:  * Full participation in all desired activities (may need albuterol  before activity) * Albuterol  use two time or less a week on average (not counting use with activity) * Cough interfering with sleep two time or less a month * Oral steroids no more than once a year * No hospitalizations  4. Seasonal and perennial allergic rhinitis (grass, dog) - Continue with cetirizine  10mg  daily. - Continue with levocetirizine 5 mg daily. - Continue with Flonase  one spray per nostril daily as needed.   5. Contact dermatitis - with sensitizations to p-tert butylphenol formaldehyde resin as well as Cl+ Me-Isothiazolinone and gold - Continue to avoid all of your triggering chemicals.  6. Return in about 3 months (around 02/20/2024). You can have the follow up appointment with Dr. Idolina Maker or a Nurse Practicioner (our Nurse Practitioners are excellent and always have Physician oversight!).    Please inform us  of any Emergency Department visits, hospitalizations, or changes in symptoms. Call us  before going to the ED for breathing or allergy   symptoms since we might be able to fit you in for a sick visit. Feel free to contact us  anytime with any questions, problems, or concerns.  It was a pleasure to see you again today!  Websites that have reliable patient information: 1. American Academy of Asthma, Allergy , and Immunology: www.aaaai.org 2. Food Allergy  Research and Education (FARE): foodallergy.org 3. Mothers of Asthmatics: http://www.asthmacommunitynetwork.org 4. [employer] of Allergy , Asthma, and Immunology: www.acaai.org      "Like" us  on Facebook and Instagram for our latest updates!      A healthy democracy works best when [employer] participate! Make sure you are registered to vote! If you have moved or changed any of your contact information, you will need to get this updated before voting! Scan the QR codes below to learn more!

## 2023-11-21 NOTE — Progress Notes (Unsigned)
 FOLLOW UP  Date of Service/Encounter:  11/21/23   Assessment:   Exercise-induced bronchospasm - with normal spirometry    Rash - previously improving with Dupixent  (starting Rinvoq today)   Perennial and seasonal allergic rhinitis (grass, dog)   Failed multiple topical medications including Eucrisa , Protopic , hydrocortisone, clobetasol , and triamcinolone    Contact dermatitis - with sensitizations to p-tert butylphenol formaldehyde resin as well as Cl+ Me-Isothiazolinone and gold   Hypercholesterolemia - now on a statin with improvement in numbers   Mild transaminitis - with a history of NASH   Currently receiving hormonal replacement therapy   New onset bruising - getting CBC and bleeding studies  Senile purpura   Plan/Recommendations:   There are no Patient Instructions on file for this visit.   Subjective:   [name] is a 53 y.o. female presenting today for follow up of  Chief Complaint  Patient presents with   Follow-up    No any better since last visit 2 weeks ago.     [name] has a history of the following: Patient Active Problem List   Diagnosis Date Noted   Alcohol use disorder, severe, dependence (HCC) 10/17/2022   Allergic contact dermatitis due to chemical 05/20/2022   Syncope 01/19/2022   Family history of colon cancer    Chronic diarrhea    Benign neoplasm of ascending colon    Benign neoplasm of transverse colon    Benign neoplasm of sigmoid colon    Allergic drug reaction 10/09/2020   Pruritic condition 10/09/2020   Vitamin D  deficiency 05/20/2020   Hypertension    Spastic bladder    Prediabetes 01/01/2020   Diastolic dysfunction 12/09/2019   DDD (degenerative disc disease), lumbar 11/13/2019   Spinal stenosis of lumbar region 09/20/2019   Numbness of hand 05/20/2019   Acquired hypothyroidism 07/13/2017   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   Gluten-sensitive enteropathy 07/13/2017    Lactose intolerance in adult 07/13/2017   History of decompression of median nerve 02/17/2017   Carpal tunnel syndrome 12/21/2016   Pseudoarthrosis of cervical spine (HCC) 07/04/2016   History of arthrodesis 08/25/2015   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 07/16/2015   Spinal cord disease (HCC) 07/10/2015   Cervical spondylosis with myelopathy 07/10/2015   DDD (degenerative disc disease), cervical 07/10/2015   Fibromyalgia 07/07/2015   Lumbar spondylosis 03/05/2014   Degeneration of lumbar intervertebral disc 03/05/2014   Paresthesia of lower extremity 02/04/2014   Obesity, unspecified 05/09/2013   Bilateral renal cysts 05/09/2013   Dyspareunia 02/25/2013   Mild intermittent asthma without complication 12/29/2012   Hyperglycemia 11/07/2012   Other malaise and fatigue 11/07/2012   Anxiety state 04/22/2010   GERD 04/22/2010   ILEUS 04/22/2010   IRRITABLE BOWEL SYNDROME 04/22/2010   Sleep apnea 04/22/2010   LIVER FUNCTION TESTS, ABNORMAL, HX OF 04/22/2010   Generalized anxiety disorder 04/22/2010   Hyperlipidemia associated with type 2 diabetes mellitus (HCC) 12/03/2009   Essential hypertension 12/03/2009   Liver cirrhosis secondary to NASH (HCC) 12/03/2009   CHEST PAIN UNSPECIFIED 12/03/2009    History obtained from: chart review and {Persons; PED relatives w/patient:19415::"patient"}.  Discussed the use of AI scribe software for clinical note transcription with the patient and/or guardian, who gave verbal consent to proceed.  [name] is a 53 y.o. female presenting for {Blank single:19197::"a food challenge","a drug challenge","skin testing","a sick visit","an evaluation of ***","a follow up visit"}.  She was last seen in April 2025.  At that time, we  started her on cyclosporine  100 mg twice a day.  We continue with the clobetasol  as well as triamcinolone .  For the blacking out episodes, she was talking to her neurologist.  For her lung testing, she  continue with albuterol  as needed.  She also continue with cetirizine  as well as levocetirizine and Flonase .  She had contact dermatitis and continue to avoid all of her triggers.  Since last visit,  Asthma/Respiratory Symptom History: ***  Allergic Rhinitis Symptom History: ***  Food Allergy  Symptom History: ***  Skin Symptom History: ***  GERD Symptom History: ***  Infection Symptom History: ***  Otherwise, there have been no changes to her past medical history, surgical history, family history, or social history.    Review of systems otherwise negative other than that mentioned in the HPI.    Objective:   Blood pressure 120/72, pulse 94, temperature 97.7 F (36.5 C), temperature source Temporal, resp. rate 19, SpO2 97%. There is no height or weight on file to calculate BMI.    Physical Exam   Diagnostic studies: {Blank single:19197::"none","deferred due to recent antihistamine use","deferred due to insurance stipulations that require a separate visit for testing","labs sent instead"," "}  Spirometry: {Blank single:19197::"results normal (FEV1: ***%, FVC: ***%, FEV1/FVC: ***%)","results abnormal (FEV1: ***%, FVC: ***%, FEV1/FVC: ***%)"}.    {Blank single:19197::"Spirometry consistent with mild obstructive disease","Spirometry consistent with moderate obstructive disease","Spirometry consistent with severe obstructive disease","Spirometry consistent with possible restrictive disease","Spirometry consistent with mixed obstructive and restrictive disease","Spirometry uninterpretable due to technique","Spirometry consistent with normal pattern"}. {Blank single:19197::"Albuterol /Atrovent nebulizer","Xopenex/Atrovent nebulizer","Albuterol  nebulizer","Albuterol  four puffs via MDI","Xopenex four puffs via MDI"} treatment given in clinic with {Blank single:19197::"significant improvement in FEV1 per ATS criteria","significant improvement in FVC per ATS criteria","significant  improvement in FEV1 and FVC per ATS criteria","improvement in FEV1, but not significant per ATS criteria","improvement in FVC, but not significant per ATS criteria","improvement in FEV1 and FVC, but not significant per ATS criteria","no improvement"}.  Allergy  Studies: {Blank single:19197::"none","deferred due to recent antihistamine use","deferred due to insurance stipulations that require a separate visit for testing","labs sent instead"," "}    {Blank single:19197::"Allergy  testing results were read and interpreted by myself, documented by clinical staff."," "}      Drexel Gentles, MD  [facility] 

## 2023-11-26 ENCOUNTER — Ambulatory Visit (HOSPITAL_BASED_OUTPATIENT_CLINIC_OR_DEPARTMENT_OTHER)
Admission: RE | Admit: 2023-11-26 | Discharge: 2023-11-26 | Disposition: A | Discharge status: Home / Self Care | Source: Ambulatory Visit | Attending: Urology | Admitting: Urology

## 2023-11-26 DIAGNOSIS — N2889 Other specified disorders of kidney and ureter: Secondary | ICD-10-CM | POA: Insufficient documentation

## 2023-11-26 MED ORDER — GADOBUTROL 1 MMOL/ML IV SOLN
9.0000 mL | Freq: Once | INTRAVENOUS | Status: AC | PRN
  Administered 2023-11-26: 9 mL via INTRAVENOUS

## 2023-12-14 ENCOUNTER — Other Ambulatory Visit: Payer: Self-pay | Admitting: Allergy & Immunology

## 2023-12-20 ENCOUNTER — Encounter: Payer: Self-pay | Admitting: Urology

## 2023-12-20 ENCOUNTER — Ambulatory Visit (INDEPENDENT_AMBULATORY_CARE_PROVIDER_SITE_OTHER): Payer: Managed Care, Other (non HMO) | Admitting: Urology

## 2023-12-20 VITALS — BP 155/111 | HR 78 | Ht 65.0 in | Wt 195.0 lb

## 2023-12-20 DIAGNOSIS — N3281 Overactive bladder: Secondary | ICD-10-CM

## 2023-12-20 DIAGNOSIS — N281 Cyst of kidney, acquired: Secondary | ICD-10-CM | POA: Diagnosis not present

## 2023-12-20 DIAGNOSIS — N390 Urinary tract infection, site not specified: Secondary | ICD-10-CM

## 2023-12-20 DIAGNOSIS — Z87440 Personal history of urinary (tract) infections: Secondary | ICD-10-CM

## 2023-12-20 LAB — URINALYSIS, ROUTINE W REFLEX MICROSCOPIC
Bilirubin, UA: NEGATIVE
Glucose, UA: NEGATIVE
Ketones, UA: NEGATIVE
Leukocytes,UA: NEGATIVE
Nitrite, UA: NEGATIVE
Protein,UA: NEGATIVE
RBC, UA: NEGATIVE
Specific Gravity, UA: 1.02 (ref 1.005–1.030)
Urobilinogen, Ur: 0.2 mg/dL (ref 0.2–1.0)
pH, UA: 7 (ref 5.0–7.5)

## 2023-12-20 MED ORDER — NITROFURANTOIN MONOHYD MACRO 100 MG PO CAPS: 100.0000 mg | ORAL_CAPSULE | ORAL | 2 refills | Status: AC | PRN

## 2023-12-20 NOTE — Progress Notes (Signed)
 Assessment: 1. Recurrent UTI   2. Complex renal cyst   3. OAB (overactive bladder)     Plan: I personally reviewed the patient's chart including provider notes, lab and imaging results. Recent MRI results discussed with the patient today.  The renal cysts do not require further evaluation.  I reviewed methods to reduce the risk of UTIs with the patient today.  We discussed increase fluid intake, timed and double voiding, daily cranberry supplement, daily probiotic, vaginal estrogen cream and postcoital prophylaxis. I encouraged her to use the Estrace  vaginal cream 2-3 times per week. Will change to Macrodantin  for postcoital prophylaxis.  If this is not successful in preventing her UTIs, will consider resuming daily Macrobid . Continue solifenacin  10 mg daily. Return to office in 2 months.  Records from urgent care in [place] requested for review.  Chief Complaint: Chief Complaint  Patient presents with   Recurrent UTI    HPI: [name] is a 53 y.o. female who presents for continued evaluation of recurrent UTI. She was initially seen by Dr. Del Favia in May 2024. Patient has had a long history of recurrent urinary tract infections including a history of pyelonephritis when she was in her 15s and recently also questionable febrile UTI.  She has had at least 4 UTIs in the past year and her typical symptoms include dysuria, frequency, and urgency.   Her UTIs are temporally related to sexual intercourse.  She typically has intercourse several times a week. She has previously been on long-term Macrodantin  suppression. She is currently on HRT-implant pellets Patient continues to take vesicare  10mg  for oab (Rx by GYN).  Doing well from this standpoint.   She is also using UTI prevention strategies including urinary probiotic, cranberry suppl and vaginal estrogen cream and post coital bactrim .  She does note a temporal relationship between UTIs and sexual intercourse as well.   In  January 2025, she was diagnosed with a citrobacter UTI.  OAB symptoms worse since UTI.   Had CT a/p with contrast 08/03/23 at [facility]-- IMPRESSION:   1. Diffuse urinary bladder wall thickening, concerning for infectious or inflammatory cystitis.  2.  Tiny locules of gas within the urinary bladder, which can be seen with recent instrumentation/catheterization if there is recent history of such. Otherwise, gas forming infection is a possibility.  3.  Nonobstructing left renal calculi. No hydronephrosis.  4.  Lesion measuring 1.7 cm within the interpolar region of the left kidney. While possibly a complicated renal cyst, nonemergent outpatient renal MRI is suggested for further evaluation to exclude renal neoplasm.  5.  Ancillary and chronic findings successful.   MRI abdomen with and without contrast from 11/27/2023 showed multiple simple and thinly septated renal cortical cysts bilaterally consistent with Bosniak 1 and 2 cysts.  She returns today for follow-up.  She continues on Bactrim  postcoital.  She is using Estrace  vaginal cream intermittently.  She continues on solifenacin  10 mg daily with adequate control of her bladder symptoms.  She was recently diagnosed at urgent care with a UTI.  She was started on nitrofurantoin  x 1 week.  Her symptoms have improved.  She is unsure if a culture was obtained.  Portions of the above documentation were copied from a prior visit for review purposes only.  Allergies: Allergies  Allergen Reactions   Cefdinir  Itching   Dairy Aid [Tilactase]     Unsure of reaction.    Dog Epithelium Itching   Gluten Meal Other (See Comments)    Unsure of  reaction.   Grass Pollen(K-O-R-T-Swt Vern) Itching    migraine   Levaquin  [Levofloxacin  In D5w] Diarrhea and Nausea And Vomiting   Other     PT STATES NO BLOOD PRODUCTS OR BLOOD TRANSFUSIONS    Topamax  [Topiramate ]     Causes anger   Wheat Other (See Comments)    Unsure of reaction.    PMH: Past Medical History:   Diagnosis Date   Allergy     Anemia    hx   Anxiety    takes Klonopin  daily as needed.takes Celexa  daily   Arthritis    left foot and hands   Asthma    exercise induced   Back pain    Bilateral swelling of feet    Complication of anesthesia    slow to wake  up   Diabetes mellitus without complication (HCC)    Diastolic dysfunction 11/2019   per patient    Granuloma annulare    Herpes infection    High cholesterol    takes Pravastatin  daily   History of bronchitis    several yrs ago   History of migraine    last one about 2 wks ago   Hypertension    IBS (irritable bowel syndrome)    takes Trulance  and ProBiotic Daily   Inflammation of hair follicles    takes Minocycline daily   Insomnia    takes Melatonin nightly   Joint pain    Lactose intolerance    Liver fibrosis    Menopausal state    Migraine    NASH (nonalcoholic steatohepatitis)    Prediabetes 01/01/2020   Sleep apnea    sleep study > 5 yrs ago. no CPAP use    Sleep apnea    Spastic bladder    takes Myrbetriq  daily   Tingling    r/t neck. Pseudoarthrosis    Tubular adenoma of colon    UTI (urinary tract infection)    hx of but takes Macrodantin  daily. has been on for 2 1/2 yrs     PSH: Past Surgical History:  Procedure Laterality Date   ABDOMINAL HYSTERECTOMY  2005   complete hysterectomy both ovaries removed   BIOPSY  10/14/2021   Procedure: BIOPSY;  Surgeon: Nannette Babe, MD;  Location: [facility];  Service: Gastroenterology;;   BREAST REDUCTION SURGERY  2009   CARPAL TUNNEL RELEASE     carpel tunnel Bilateral 02/2017, 2021   CERVICAL DISC SURGERY  2007, 2009, 2014, 2017   anterior  x 3 , posterior x 2    CESAREAN SECTION  94/03   x 2   COLONOSCOPY     COLONOSCOPY WITH PROPOFOL  N/A 10/14/2021   Procedure: COLONOSCOPY WITH PROPOFOL ;  Surgeon: Nannette Babe, MD;  Location: [facility];  Service: Gastroenterology;  Laterality: N/A;   ESOPHAGOGASTRODUODENOSCOPY     POLYPECTOMY  10/14/2021    Procedure: POLYPECTOMY;  Surgeon: Nannette Babe, MD;  Location: [facility];  Service: Gastroenterology;;   POSTERIOR CERVICAL FUSION/FORAMINOTOMY N/A 01/07/2013   Procedure: POSTERIOR CERVICAL FUSION/FORAMINOTOMY LEVEL 2;  Surgeon: Corrina Dimitri, MD;  Location: [facility];  Service: Neurosurgery;  Laterality: N/A;  Posterior Cervical Five-Six/Six-Seven Arthrodesis with Instrumentation   POSTERIOR CERVICAL FUSION/FORAMINOTOMY N/A 07/04/2016   Procedure: CERVICAL THREE-FOUR POSTERIOR CERVICAL ARTHRODESIS;  Surgeon: Yvonna Herder, MD;  Location: [facility] OR;  Service: Neurosurgery;  Laterality: N/A;  C3-C4 POSTERIOR CERVICAL ARTHRODESIS   tummy tuck     TYMPANOSTOMY TUBE PLACEMENT Bilateral    As a baby  UPPER GASTROINTESTINAL ENDOSCOPY      SH: Social History   Tobacco Use   Smoking status: Never   Smokeless tobacco: Never  Vaping Use   Vaping status: Never Used  Substance Use Topics   Alcohol use: Yes    Alcohol/week: 1.0 standard drink of alcohol    Types: 1 Glasses of wine per week    Comment: occasional   Drug use: No    ROS: Constitutional:  Negative for fever, chills, weight loss CV: Negative for chest pain, previous MI, hypertension Respiratory:  Negative for shortness of breath, wheezing, sleep apnea, frequent cough GI:  Negative for nausea, vomiting, bloody stool, GERD  PE: BP (!) 155/111   Pulse 78   Ht 5\' 5"  (1.651 m)   Wt 195 lb (88.5 kg)   BMI 32.45 kg/m  GENERAL APPEARANCE:  Well appearing, well developed, well nourished, NAD HEENT:  Atraumatic, normocephalic, oropharynx clear NECK:  Supple without lymphadenopathy or thyromegaly ABDOMEN:  Soft, non-tender, no masses EXTREMITIES:  Moves all extremities well, without clubbing, cyanosis, or edema NEUROLOGIC:  Alert and oriented x 3, normal gait, CN II-XII grossly intact MENTAL STATUS:  appropriate BACK:  Non-tender to palpation, No CVAT SKIN:  Warm, dry, and intact   Results: U/A: Negative

## 2024-01-14 ENCOUNTER — Other Ambulatory Visit: Payer: Self-pay | Admitting: Urology

## 2024-01-20 ENCOUNTER — Other Ambulatory Visit: Payer: Self-pay | Admitting: Family Medicine

## 2024-01-30 ENCOUNTER — Ambulatory Visit (INDEPENDENT_AMBULATORY_CARE_PROVIDER_SITE_OTHER): Admitting: Allergy & Immunology

## 2024-01-30 ENCOUNTER — Encounter: Payer: Self-pay | Admitting: Allergy & Immunology

## 2024-01-30 ENCOUNTER — Other Ambulatory Visit: Payer: Self-pay

## 2024-01-30 VITALS — BP 130/98 | HR 100 | Temp 98.3°F | Resp 16

## 2024-01-30 DIAGNOSIS — J45990 Exercise induced bronchospasm: Secondary | ICD-10-CM

## 2024-01-30 DIAGNOSIS — L2389 Allergic contact dermatitis due to other agents: Secondary | ICD-10-CM | POA: Diagnosis not present

## 2024-01-30 DIAGNOSIS — L2089 Other atopic dermatitis: Secondary | ICD-10-CM

## 2024-01-30 DIAGNOSIS — J302 Other seasonal allergic rhinitis: Secondary | ICD-10-CM

## 2024-01-30 DIAGNOSIS — L299 Pruritus, unspecified: Secondary | ICD-10-CM

## 2024-01-30 DIAGNOSIS — J3089 Other allergic rhinitis: Secondary | ICD-10-CM

## 2024-01-30 NOTE — Progress Notes (Signed)
 FOLLOW UP  Date of Service/Encounter:  01/30/24   Assessment:   Exercise-induced bronchospasm - with normal spirometry    Rash - previously improving with Dupixent  and now transitioning to Rinvoq (failed cyclosporine  and azathioprine )   Perennial and seasonal allergic rhinitis (grass, dog)   Failed multiple topical medications including Eucrisa , Protopic , hydrocortisone, clobetasol , triamcinolone , and cyclosporine    Contact dermatitis - with sensitizations to p-tert butylphenol formaldehyde resin as well as Cl+ Me-Isothiazolinone and gold   Hypercholesterolemia - now on a statin with improvement in numbers   Mild transaminitis - with a history of NASH   Currently receiving hormonal replacement therapy   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 Rash - We failed cyclosporine  and azathioprine  - We can NOW get Rinvoq approved, hopefully.  - Samples provided today (start the 30mg  daily, but we will decrease down to 15mg  in the script to the pharmacy). - Repeat blood work sent today since the original blood work was two years old. - This is stuff we have to check before we restart the Rinvoq.  - Continue with on clobetasol  shampoo twice daily as needed.  - Continue with triamcinolone  cream twice daily as needed (for the upper chest).  2. Exercise induced bronchospasm - Lung testing not done today. - Glad you finished the antibiotic for your pneumonia. - Daily controller medication(s): NOTHING - Prior to physical activity: albuterol  2 puffs 10-15 minutes before physical activity. - Rescue medications: albuterol  4 puffs every 4-6 hours as needed - Asthma control goals:  * Full participation in all desired activities (may need albuterol  before activity) * Albuterol  use two time or less a week on average (not counting use with activity) * Cough interfering with sleep two time or less a month * Oral steroids no more than once a year * No hospitalizations  4.  Seasonal and perennial allergic rhinitis (grass, dog) - Continue with cetirizine  10mg  daily. - Continue with levocetirizine 5 mg daily. - Continue with Flonase  one spray per nostril daily as needed.   5. Contact dermatitis - with sensitizations to p-tert butylphenol formaldehyde resin as well as Cl+ Me-Isothiazolinone and gold - Continue to avoid all of your triggering chemicals.  6. Return in about 6 months (around 08/01/2024). You can have the follow up appointment with Dr. Iva or a Nurse Practicioner (our Nurse Practitioners are excellent and always have Physician oversight!).   Subjective:   [name] is a 53 y.o. female presenting today for follow up of  Chief Complaint  Patient presents with   Follow-up    Wants to discuss rinvoq.    [name] has a history of the following: Patient Active Problem List   Diagnosis Date Noted   Alcohol use disorder, severe, dependence (HCC) 10/17/2022   Allergic contact dermatitis due to chemical 05/20/2022   Syncope 01/19/2022   Family history of colon cancer    Chronic diarrhea    Benign neoplasm of ascending colon    Benign neoplasm of transverse colon    Benign neoplasm of sigmoid colon    Allergic drug reaction 10/09/2020   Pruritic condition 10/09/2020   Vitamin D  deficiency 05/20/2020   Hypertension    Spastic bladder    Prediabetes 01/01/2020   Diastolic dysfunction 12/09/2019   DDD (degenerative disc disease), lumbar 11/13/2019   Spinal stenosis of lumbar region 09/20/2019   Numbness of hand 05/20/2019   Acquired hypothyroidism 07/13/2017   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  Gluten-sensitive enteropathy 07/13/2017   Lactose intolerance in adult 07/13/2017   History of decompression of median nerve 02/17/2017   Carpal tunnel syndrome 12/21/2016   Pseudoarthrosis of cervical spine (HCC) 07/04/2016   History of arthrodesis 08/25/2015   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 07/16/2015   Spinal cord disease (HCC) 07/10/2015   Cervical spondylosis with myelopathy 07/10/2015   DDD (degenerative disc disease), cervical 07/10/2015   Fibromyalgia 07/07/2015   Lumbar spondylosis 03/05/2014   Degeneration of lumbar intervertebral disc 03/05/2014   Paresthesia of lower extremity 02/04/2014   Obesity, unspecified 05/09/2013   Bilateral renal cysts 05/09/2013   Dyspareunia 02/25/2013   Mild intermittent asthma without complication 12/29/2012   Hyperglycemia 11/07/2012   Other malaise and fatigue 11/07/2012   Anxiety state 04/22/2010   GERD 04/22/2010   ILEUS 04/22/2010   IRRITABLE BOWEL SYNDROME 04/22/2010   Sleep apnea 04/22/2010   LIVER FUNCTION TESTS, ABNORMAL, HX OF 04/22/2010   Generalized anxiety disorder 04/22/2010   Hyperlipidemia associated with type 2 diabetes mellitus (HCC) 12/03/2009   Essential hypertension 12/03/2009   Liver cirrhosis secondary to NASH (HCC) 12/03/2009   CHEST PAIN UNSPECIFIED 12/03/2009    History obtained from: chart review and patient.  Discussed the use of AI scribe software for clinical note transcription with the patient and/or guardian, who gave verbal consent to proceed.  [name] is a 53 y.o. female presenting for a follow up visit.  She was last seen in April 2025.  At that time, we started azathioprine  50 mg once daily.  We asked her to send a message in 4 weeks to let us  know whether it worked.  This was one of the steps we needed to get Rinvoq approved.  We also continue with clobetasol  and triamcinolone .  For her exercise-induced bronchospasm, we continue with albuterol  as needed.  For her allergic rhinitis, we will continue with cetirizine  as well as needed cetirizine  and Flonase .  She also has a history of contact dermatitis and has multiple triggers.  She had already failed cyclosporine .  Since last visit, she has been about the same.  She has been experiencing pneumonia for two  weeks. Initially diagnosed with a viral infection, she was treated with prednisone , an inhaler, and nasal spray, which did not alleviate her symptoms. A chest x-ray at urgent care led to a prescription of doxycycline , which she completed yesterday. Bed rest over the past two weeks has aided her recovery, and she is gradually regaining energy. No current breathing difficulties are noted after completing the antibiotic course.  She completed a course of doxycycline .  She reports skin issues characterized by redness and irritation on her upper chest.  She also has senile purpura on her bilateral arms.  She tells me this is from a lack of collagen.  She is a second dermatologist who did not diagnose her with 'senile purpura' this time. She has not been exposed to the sun, and her condition improved while bedridden. She is awaiting an appointment with another dermatologist who sees her husband, Lynwood, for similar skin issues.   She has been using bruise cream and drinking collagen and aloe vera juice without noticeable improvement. She recalls her skin condition might have been better when she was on Rinvoq, although she is unsure.  Regardless, anything she is using now is not really working.  She has failed cyclosporine .  We changed her to azathioprine  around 3 months ago.  She did 1 month of this and forgot to email  last, but in any case it did not change the trajectory of her rash.  She would like to go back on the Rinvoq.  I reviewed her labs and the last time we did them for starting Rinvoq was 2 years ago, so we should probably get those updated.  She has a history of needing a total knee replacement by the end of the year and recalls having COVID, which disrupted her previous medical evaluations. She has not completed patch testing for metal allergies, which is relevant to her upcoming surgery.     Otherwise, there have been no changes to her past medical history, surgical history, family history, or  social history.    Review of systems otherwise negative other than that mentioned in the HPI.    Objective:   Blood pressure (!) 130/98, pulse 100, temperature 98.3 F (36.8 C), resp. rate 16, SpO2 99%. There is no height or weight on file to calculate BMI.    Physical Exam Vitals reviewed.  Constitutional:      Appearance: She is well-developed.  HENT:     Head: Normocephalic and atraumatic.     Right Ear: Tympanic membrane, ear canal and external ear normal.     Left Ear: Tympanic membrane, ear canal and external ear normal.     Nose: No nasal deformity, septal deviation, mucosal edema or rhinorrhea.     Right Turbinates: Enlarged, swollen and pale.     Left Turbinates: Enlarged, swollen and pale.     Right Sinus: No maxillary sinus tenderness or frontal sinus tenderness.     Left Sinus: No maxillary sinus tenderness or frontal sinus tenderness.     Comments: No nasal polyps noted.     Mouth/Throat:     Lips: Pink.     Mouth: Mucous membranes are moist. Mucous membranes are not pale and not dry.     Pharynx: Uvula midline.  Eyes:     General: Lids are normal. Allergic shiner present.        Right eye: No discharge.        Left eye: No discharge.     Conjunctiva/sclera: Conjunctivae normal.     Right eye: Right conjunctiva is not injected. No chemosis.    Left eye: Left conjunctiva is not injected. No chemosis.    Pupils: Pupils are equal, round, and reactive to light.  Cardiovascular:     Rate and Rhythm: Normal rate and regular rhythm.     Heart sounds: Normal heart sounds.  Pulmonary:     Effort: Pulmonary effort is normal. No tachypnea, accessory muscle usage or respiratory distress.     Breath sounds: Normal breath sounds. No wheezing, rhonchi or rales.     Comments: Moving air well in all lung fields.  No crackles or wheezes noted. Chest:     Chest wall: No tenderness.  Lymphadenopathy:     Cervical: No cervical adenopathy.  Skin:    General: Skin is warm.      Capillary Refill: Capillary refill takes less than 2 seconds.     Coloration: Skin is not pale.     Findings: Rash present. No abrasion or erythema. Rash is not papular, urticarial or vesicular.     Comments: Rash is slowly coming back. It appears like a sunburn once again on her upper chest and her arms.  The skin on her upper chest is more raised like it was when I first saw her. It definitely looks worse today than other times that I  have seen her.   Neurological:     Mental Status: She is alert.  Psychiatric:        Behavior: Behavior is cooperative.      Diagnostic studies: labs sent instead       Marty Shaggy, MD  [facility] 

## 2024-01-30 NOTE — Patient Instructions (Addendum)
 1. Rash - We failed cyclosporine  and azathioprine  - We can NOW get Rinvoq approved, hopefully.  - Samples provided today (start the 30mg  daily, but we will decrease down to 15mg  in the script to the pharmacy). - Repeat blood work sent today since the original blood work was two years old. - This is stuff we have to check before we restart the Rinvoq.  - Continue with on clobetasol  shampoo twice daily as needed.  - Continue with triamcinolone  cream twice daily as needed (for the upper chest).  2. Exercise induced bronchospasm - Lung testing not done today. - Glad you finished the antibiotic for your pneumonia. - Daily controller medication(s): NOTHING - Prior to physical activity: albuterol  2 puffs 10-15 minutes before physical activity. - Rescue medications: albuterol  4 puffs every 4-6 hours as needed - Asthma control goals:  * Full participation in all desired activities (may need albuterol  before activity) * Albuterol  use two time or less a week on average (not counting use with activity) * Cough interfering with sleep two time or less a month * Oral steroids no more than once a year * No hospitalizations  4. Seasonal and perennial allergic rhinitis (grass, dog) - Continue with cetirizine  10mg  daily. - Continue with levocetirizine 5 mg daily. - Continue with Flonase  one spray per nostril daily as needed.   5. Contact dermatitis - with sensitizations to p-tert butylphenol formaldehyde resin as well as Cl+ Me-Isothiazolinone and gold - Continue to avoid all of your triggering chemicals.  6. Return in about 6 months (around 08/01/2024). You can have the follow up appointment with Dr. Iva or a Nurse Practicioner (our Nurse Practitioners are excellent and always have Physician oversight!).    Please inform us  of any Emergency Department visits, hospitalizations, or changes in symptoms. Call us  before going to the ED for breathing or allergy  symptoms since we might be able to fit you  in for a sick visit. Feel free to contact us  anytime with any questions, problems, or concerns.  It was a pleasure to see you again today!  Websites that have reliable patient information: 1. American Academy of Asthma, Allergy , and Immunology: www.aaaai.org 2. Food Allergy  Research and Education (FARE): foodallergy.org 3. Mothers of Asthmatics: http://www.asthmacommunitynetwork.org 4. [employer] of Allergy , Asthma, and Immunology: www.acaai.org      "Like" us  on Facebook and Instagram for our latest updates!      A healthy democracy works best when [employer] participate! Make sure you are registered to vote! If you have moved or changed any of your contact information, you will need to get this updated before voting! Scan the QR codes below to learn more!

## 2024-01-31 LAB — CMP14+EGFR
ALT: 18 IU/L (ref 0–32)
AST: 20 IU/L (ref 0–40)
Albumin: 4.6 g/dL (ref 3.8–4.9)
Alkaline Phosphatase: 83 IU/L (ref 44–121)
BUN/Creatinine Ratio: 13 (ref 9–23)
BUN: 12 mg/dL (ref 6–24)
Bilirubin Total: 0.6 mg/dL (ref 0.0–1.2)
CO2: 22 mmol/L (ref 20–29)
Calcium: 9.9 mg/dL (ref 8.7–10.2)
Chloride: 99 mmol/L (ref 96–106)
Creatinine, Ser: 0.93 mg/dL (ref 0.57–1.00)
Globulin, Total: 2.9 g/dL (ref 1.5–4.5)
Glucose: 89 mg/dL (ref 70–99)
Potassium: 4.4 mmol/L (ref 3.5–5.2)
Sodium: 140 mmol/L (ref 134–144)
Total Protein: 7.5 g/dL (ref 6.0–8.5)
eGFR: 73 mL/min/1.73 (ref >59)

## 2024-01-31 LAB — LIPID PANEL
Chol/HDL Ratio: 4.2 ratio (ref 0.0–4.4)
Cholesterol, Total: 242 mg/dL — ABNORMAL HIGH (ref 100–199)
HDL: 57 mg/dL (ref >39)
LDL Chol Calc (NIH): 121 mg/dL — ABNORMAL HIGH (ref 0–99)
Triglycerides: 368 mg/dL — ABNORMAL HIGH (ref 0–149)
VLDL Cholesterol Cal: 64 mg/dL — ABNORMAL HIGH (ref 5–40)

## 2024-01-31 LAB — ACUTE VIRAL HEPATITIS (HAV, HBV, HCV)
HCV Ab: NONREACTIVE
Hep A IgM: NEGATIVE
Hep B C IgM: NEGATIVE
Hepatitis B Surface Ag: NEGATIVE

## 2024-01-31 LAB — HCV INTERPRETATION

## 2024-02-01 LAB — QUANTIFERON-TB GOLD PLUS
QuantiFERON Mitogen Value: >10 [IU]/mL
QuantiFERON Nil Value: 0.02 [IU]/mL
QuantiFERON TB1 Ag Value: 0.07 [IU]/mL
QuantiFERON TB2 Ag Value: 0.07 [IU]/mL
QuantiFERON-TB Gold Plus: NEGATIVE

## 2024-02-01 NOTE — Addendum Note (Signed)
 Addended by: IVA MARTY SALTNESS on: 02/01/2024 03:39 PM   Modules accepted: Orders

## 2024-02-05 ENCOUNTER — Other Ambulatory Visit: Payer: Self-pay | Admitting: Family Medicine

## 2024-02-06 ENCOUNTER — Ambulatory Visit: Payer: Self-pay | Admitting: Allergy & Immunology

## 2024-02-06 DIAGNOSIS — R21 Rash and other nonspecific skin eruption: Secondary | ICD-10-CM

## 2024-02-06 DIAGNOSIS — T148XXA Other injury of unspecified body region, initial encounter: Secondary | ICD-10-CM

## 2024-02-07 NOTE — Addendum Note (Signed)
 Addended by: IVA MARTY SALTNESS on: 02/07/2024 05:13 PM   Modules accepted: Orders

## 2024-02-11 ENCOUNTER — Other Ambulatory Visit: Payer: Self-pay | Admitting: Urology

## 2024-02-15 ENCOUNTER — Ambulatory Visit: Admitting: Urology

## 2024-02-20 NOTE — ED Provider Notes (Signed)
 [facility]  ED Provider Note Medical screening exam initiated in triage. HPI reviewed. Vital signs reviewed. RLQ abdominal pain and diarrhea  Patient stable and in no distress in triage. Powell, PA-C 4:01 PM 02/20/2024    [name] 53 y.o. female DOB: 1971-03-29 MRN: [id number] History   Chief Complaint  Patient presents with  . Abdominal Pain    8 days of diarrhea with left lower quadrant pain   53 year old female presents to the emergency department for evaluation of 8-day history of diarrhea and right lower quadrant abdominal pain.  Has been constant.  Went to urgent care and was referred to the emergency department for further evaluation.  She states she has 5-10 episodes a day.'s been taking loperamide with limited relief.  Denies any urinary symptoms.  No vomiting.   History provided by:  Patient Language interpreter used: No   Diarrhea Severity:  Moderate Onset quality:  Gradual Timing:  Constant Progression:  Unchanged Relieved by:  Nothing Worsened by:  Nothing Associated symptoms: abdominal pain   Associated symptoms: no fever and no headaches        Past Medical History:  Diagnosis Date  . Anxiety   . Asthma (*)   . Bladder troubles   . Hyperlipidemia   . Hypertension   . IBS (irritable bowel syndrome)   . Migraine     Past Surgical History:  Procedure Laterality Date  . Cesarean section    . Hysterectomy    . Mouth surgery    . Neck surgery    . Reduction mammaplasty      Social History   Substance and Sexual Activity  Alcohol Use Yes   Tobacco Use History[1] E-Cigarettes  . Vaping Use Never User   . Start Date    . Cartridges/Day    . Quit Date     Social History   Substance and Sexual Activity  Drug Use No     Immunizations Up to Date?: Unknown   Allergies[2]  Home Medications   ACAMPROSATE  (CAMPRAL ) 333 MG TABLET    Take two tablets (666 mg dose) by mouth.   ACYCLOVIR  (ZOVIRAX )  400 MG TABLET    Take one tablet (400 mg dose) by mouth every morning.   ALBUTEROL  SULFATE HFA (PROVENTIL ,VENTOLIN ,PROAIR ) 108 (90 BASE) MCG/ACT INHALER    one puff as needed.   ALPHA-TOCOPHEROL (VITA-PLUS) 400 UNITS CAPSULE    Take one capsule (400 Units dose) by mouth 2 (two) times daily.   CHOLECALCIFEROL  (D-3-5,D-5000) 125 MCG (5000 UT) CAPSULE    Take one capsule (5,000 Units dose) by mouth every morning.   CITALOPRAM  (CELEXA ) 40 MG TABLET    Take one tablet (40 mg dose) by mouth every morning.   CLINDAMYCIN  (CLEOCIN  T) 1 % LOTION       CLOBETASOL  PROPIONATE (CLOBEX ) 0.05% SHAMPOO    Apply 1 Application topically daily as needed.   CLONAZEPAM  (KLONOPIN ) 1 MG TABLET    Take 0.5-1 mg by mouth 2 (two) times a day as needed for Anxiety.   CYCLOBENZAPRINE  (FLEXERIL ) 10 MG TABLET    TAKE ONE TABLET BY MOUTH 3 TIMES A DAY AS NEEDED FOR MUSCLE SPASMS FOR UP TO 30 DAYS.   DICYCLOMINE  (BENTYL ) 20 MG TABLET    Take one tablet (20 mg dose) by mouth 3 (three) times a day.   FAMOTIDINE  (PEPCID ) 20 MG TABLET    Take one tablet (20 mg dose) by mouth 2 (two) times daily.   FUROSEMIDE  (  LASIX ) 20 MG TABLET    Take one tablet (20 mg dose) by mouth every morning.   IBUPROFEN (ADVIL,MOTRIN) 800 MG TABLET    Take one tablet (800 mg dose) by mouth every 8 (eight) hours as needed for Pain.   LEVOCETIRIZINE (XYZAL ) 5 MG TABLET    Take one tablet (5 mg dose) by mouth 2 (two) times daily.   LIOTHYRONINE SODIUM (CYTOMEL) 5 MCG TABLET    Take one tablet (5 mcg dose) by mouth 2 (two) times daily.   LISINOPRIL  (PRINIVIL ,ZESTRIL ) 20 MG TABLET    Take one tablet (20 mg dose) by mouth every morning.   METFORMIN  ER (GLUCOPHAGE -XR) 500 MG 24 HR TABLET    Take one tablet (500 mg dose) by mouth 2 (two) times daily.   NAPROXEN (NAPROSYN) 500 MG TABLET    TAKE 1 TABLET BY MOUTH TWICE A DAY WITH FOOD   PROMETHAZINE  (PHENERGAN ) 25 MG TABLET    Take one tablet (25 mg dose) by mouth every 8 (eight) hours as needed for Nausea.    RESPIRATORY THERAPY SUPPLIES DEVI    Inhale into the lungs at bedtime. CPAP   ROSUVASTATIN  CALCIUM  (CRESTOR ) 20 MG TABLET    Take one tablet (20 mg dose) by mouth daily.   SOLIFENACIN  SUCCINATE (VESICARE ) 10 MG TABLET    Take one tablet (10 mg dose) by mouth daily.   TURMERIC (QC TUMERIC COMPLEX PO)    Take by mouth.   VALACYCLOVIR  (VALTREX ) 1000 MG TABLET    Take one tablet (1,000 mg dose) by mouth as needed.    Primary Survey  Primary Survey  Review of Systems   Review of Systems  Constitutional:  Negative for fever.  HENT:  Negative for sore throat.   Eyes:  Negative for pain.  Respiratory:  Negative for shortness of breath.   Cardiovascular:  Negative for chest pain.  Gastrointestinal:  Positive for abdominal pain and diarrhea.  Genitourinary:  Negative for dysuria.  Musculoskeletal:  Negative for back pain.  Skin:  Negative for rash.  Neurological:  Negative for headaches.  Psychiatric/Behavioral:  Negative for confusion.     Physical Exam   ED Triage Vitals [02/20/24 1602]  BP (!) 135/96  Heart Rate 75  Resp 18  SpO2 100 %  Temp 97.4 F (36.3 C)    Physical Exam  Constitutional: She does not appear distressed and no respiratory distress.  HENT:  Head: Normocephalic.  Eyes: Pupils are equal, round, and reactive to light.  Neck: Normal range of motion.  Cardiovascular: Normal rate and normal heart sounds.  Pulmonary/Chest: No respiratory distress. Respiratory effort normal.  Abdominal: Soft. There is moderate abdominal tenderness in the right lower quadrant. Abdomen not distended.  Musculoskeletal:     Cervical back: Normal range of motion.   Neurological: She is alert.  Skin: Skin is warm. Skin is dry.  No cyanosis.  Psychiatric: She has a normal mood and affect.     ED Course   Lab results:   CBC AND DIFFERENTIAL - Abnormal      Result Value   WBC 10.3     RBC 4.34     HGB 13.5     HCT 40.2     MCV 92.6     MCH 31.1     MCHC 33.6     Plt Ct  365     RDW SD 44.9     MPV 11.6     NRBC% 0.0     Absolute NRBC  Count 0.00     NEUTROPHIL % 54.6     LYMPHOCYTE % 32.9     MONOCYTE % 10.1     Eosinophil % 0.8     BASOPHIL % 0.5     IG% 1.1     ABSOLUTE NEUTROPHIL COUNT 5.60     ABSOLUTE LYMPHOCYTE COUNT 3.37     Absolute Monocyte Count 1.04 (*)    Absolute Eosinophil Count 0.08     Absolute Basophil Count 0.05     Absolute Immature Granulocyte Count 0.11 (*)   COMPREHENSIVE METABOLIC PANEL - Abnormal   Na 136     Potassium 4.0     Cl 102     CO2 22     AGAP 12     Glucose 88     BUN 8     Creatinine 1.00     Ca 8.9     ALK PHOS 83     T Bili 0.5     Total Protein 7.0     Alb 4.0     GLOBULIN 3.0     ALBUMIN/GLOBULIN RATIO 1.3     BUN/CREAT RATIO 8.0 (*)    ALT 32     AST 45 (*)    eGFR 67     Comment: Normal GFR (glomerular filtration rate) > 60 mL/min/1.73 meters squared, < 60 may include impaired kidney function. Calculation based on the Chronic Kidney Disease Epidemiology Collaboration (CK-EPI)equation refit without adjustment for race.  LIPASE - Abnormal   Lipase 65 (*)   URINALYSIS W/MICRO REFLEX CULTURE - SYMPTOMATIC - Normal   Urine Color Yellow     Urine Clarity Clear     Urine Specific Gravity 1.006     Urine pH 5.5     Urine Protein - Dipstick Negative     Urine Glucose Negative     Urine Ketones Negative     Urine Bilirubin Negative     Urine Blood Negative     Urine Urobilinogen <2     Urine Nitrite Negative     Urine Leukocyte Esterase Negative     UA Microscopic No Micro     Narrative:    Does not meet criteria for reflex to Urine Culture.  MAGNESIUM  - Normal   Mg 2.0    BIOFIRE GI PANEL  LIGHT BLUE TOP  LAVENDER TOP  MINT GREEN-TOP TUBE (PST GEL/LI HEP)  GOLD SST    Imaging:   CT ABDOMEN PELVIS W IV CONTRAST   Narrative:    INDICATION: abdominal pain  COMPARISON:  CT abdomen pelvis 08/03/2023   TECHNIQUE:  CT ABDOMEN PELVIS W IV CONTRAST - 60 mL  Isovue 370 were administered  intravenously. Dose reduction was utilized (automated exposure control, mA or kV adjustment based on patient size, or iterative image   FINDINGS:   Atelectasis or scarring the lung bases.  Stable mild hepatomegaly. No definite large liver lesion seen. No overt biliary ductal dilation seen. Gallbladder is present. Spleen, adrenal glands, pancreas are unremarkable. Kidneys are symmetric in size and enhancement without overt hydronephrosis. Renal cysts are present. Some renal hypodensities are too small to fully characterize. Nonobstructive left renal calculus measuring 6 mm. Shotty borderline size lymph nodes seen in the porta hepatis and gastrohepatic region measuring up to 8 mm on short axis. 16 mm indeterminate lesion within the left kidney.  No evidence of any small bowel obstruction. Fluid in the colon. Mild to moderate stool burden. No overt infectious/inflammatory bowel process seen. Bladder  is unremarkable. Uterus is absent. Scattered shotty nodes in the mesentery. No significant free air, free fluid, discrete abscess seen. Scattered degenerative osseous changes. No definite fracture identified.    Impression:    IMPRESSION: 1.  No definite overt infectious/inflammatory bowel process seen. Low-grade process is not excluded. Fluid in the colon; correlate for diarrheal illness. 2.  Redemonstration of indeterminate left renal 16 mm lesion. Recommend nonemergent MRI as previously recommended. 3.  Nonobstructive left renal calculus. 4.  Scattered shotty lymph nodes could be incidental or reactive. Remaining findings as above.  Electronically Signed by: Alicia Davidson, MD on 02/20/2024 6:31 PM      ECG: ECG Results   None                                                                     Pre-Sedation Procedures    Medical Decision Making 53 year old female presents emergency department for evaluation of right lower quadrant abdominal pain.   Differential diagnosis includes but not limited to diverticulitis, appendicitis, colitis.  CT scan obtained along with lab work.  She is in no acute distress here.  She declined any pain medication initially.  CT abdomen pelvis    IMPRESSION: 1.  No definite overt infectious/inflammatory bowel process seen. Low-grade process is not excluded. Fluid in the colon; correlate for diarrheal illness. 2.  Redemonstration of indeterminate left renal 16 mm lesion. Recommend nonemergent MRI as previously recommended. 3.  Nonobstructive left renal calculus. 4.  Scattered shotty lymph nodes could be incidental or reactive. Remaining findings as above.   My last evaluation prior to discharge, patient resting comfortably.  Abdomen nonsurgical.  She states that she does have a GI doc she can follow-up with.  Labs all reassuring, white count normal, doubt C. difficile.  Problems Addressed: Diarrhea, unspecified type: acute illness or injury  Amount and/or Complexity of Data Reviewed External Data Reviewed: notes. Labs: ordered. Decision-making details documented in ED Course. Radiology: ordered. Decision-making details documented in ED Course.  Risk Prescription drug management.            Provider Communication  New Prescriptions   HYDROCODONE -ACETAMINOPHEN  (NORCO) 5-325 MG PER TABLET    Take one tablet by mouth 2 (two) times a day as needed for Pain for up to 5 days. Max Daily Amount: 2 tablets      Quantity: 10 tablet    Refills: 0   ONDANSETRON  (ZOFRAN -ODT) 4 MG DISINTEGRATING TABLET    Take one tablet (4 mg dose) by mouth every 8 (eight) hours as needed for Nausea for up to 7 days.      Quantity: 12 tablet    Refills: 0    Modified Medications   No medications on file    Discontinued Medications   No medications on file    Clinical Impression Final diagnoses:  Diarrhea, unspecified type    ED Disposition     ED Disposition  Discharge   Condition  Stable   Comment  --                    Electronically signed by:       [1] Social History Tobacco Use  Smoking Status Never  . Passive exposure: Never  Smokeless Tobacco Never  [  2] Allergies Allergen Reactions  . Cefdinir  Itching  . Dog Epithelium Itching  . Grass Pollen(K-O-R-T-Swt Vern) Itching    migraine  . Levofloxacin  Diarrhea and Nausea And Vomiting  . Other Itching    PT STATES NO BLOOD PRODUCTS OR BLOOD TRANSFUSIONS   . Wheat Other    Gluten Sensitive   . Gluten Other  . Levofloxacin  In D5w Diarrhea and Nausea And Vomiting  . Topiramate  Confusion    Causes Anger Causes anger Causes anger   . Lactase Nausea Only  . Levaquin  Nausea Only    N/v/d   Debby CHRISTELLA Breed, MD 02/20/24 [phone]

## 2024-02-22 ENCOUNTER — Ambulatory Visit: Admitting: Urology

## 2024-02-22 NOTE — Progress Notes (Deleted)
 Assessment: 1. Recurrent UTI   2. Complex renal cyst   3. OAB (overactive bladder)     Plan: Recent MRI results discussed with the patient today.  The renal cysts do not require further evaluation.  I reviewed methods to reduce the risk of UTIs with the patient today.  We discussed increase fluid intake, timed and double voiding, daily cranberry supplement, daily probiotic, vaginal estrogen cream and postcoital prophylaxis. I encouraged her to use the Estrace  vaginal cream 2-3 times per week. Will change to Macrodantin  for postcoital prophylaxis.  If this is not successful in preventing her UTIs, will consider resuming daily Macrobid . Continue solifenacin  10 mg daily. Return to office in 2 months.  Records from urgent care in [place] requested for review.  Chief Complaint: No chief complaint on file.   HPI: [name] is a 53 y.o. female who presents for continued evaluation of recurrent UTI. She was initially seen by Dr. Shona in May 2024. Patient has had a long history of recurrent urinary tract infections including a history of pyelonephritis when she was in her 10s and recently also questionable febrile UTI.  She has had at least 4 UTIs in the past year and her typical symptoms include dysuria, frequency, and urgency.   Her UTIs are temporally related to sexual intercourse.  She typically has intercourse several times a week. She has previously been on long-term Macrodantin  suppression. She is currently on HRT-implant pellets Patient continues to take vesicare  10mg  for oab (Rx by GYN).  Doing well from this standpoint.   She is also using UTI prevention strategies including urinary probiotic, cranberry suppl and vaginal estrogen cream and post coital bactrim .  She does note a temporal relationship between UTIs and sexual intercourse as well.   In January 2025, she was diagnosed with a citrobacter UTI.  OAB symptoms worse since UTI.   Had CT a/p with contrast 08/03/23 at  [facility]-- IMPRESSION:   1. Diffuse urinary bladder wall thickening, concerning for infectious or inflammatory cystitis.  2.  Tiny locules of gas within the urinary bladder, which can be seen with recent instrumentation/catheterization if there is recent history of such. Otherwise, gas forming infection is a possibility.  3.  Nonobstructing left renal calculi. No hydronephrosis.  4.  Lesion measuring 1.7 cm within the interpolar region of the left kidney. While possibly a complicated renal cyst, nonemergent outpatient renal MRI is suggested for further evaluation to exclude renal neoplasm.  5.  Ancillary and chronic findings successful.   MRI abdomen with and without contrast from 11/27/2023 showed multiple simple and thinly septated renal cortical cysts bilaterally consistent with Bosniak 1 and 2 cysts.  At her visit in May 2025, she continued on Bactrim  postcoital.  She was using Estrace  vaginal cream intermittently.  She continued on solifenacin  10 mg daily with adequate control of her bladder symptoms.  She was recently diagnosed at urgent care with a UTI.  She was started on nitrofurantoin  x 1 week.  Her symptoms improved.  She was unsure if a culture was obtained.  Portions of the above documentation were copied from a prior visit for review purposes only.  Allergies: Allergies  Allergen Reactions   Cefdinir  Itching   Dairy Aid [Tilactase]     Unsure of reaction.    Dog Epithelium Itching   Gluten Meal Other (See Comments)    Unsure of reaction.   Grass Pollen(K-O-R-T-Swt Vern) Itching    migraine   Levaquin  [Levofloxacin  In D5w] Diarrhea and Nausea  And Vomiting   Other     PT STATES NO BLOOD PRODUCTS OR BLOOD TRANSFUSIONS    Topamax  [Topiramate ]     Causes anger   Wheat Other (See Comments)    Unsure of reaction.    PMH: Past Medical History:  Diagnosis Date   Allergy     Anemia    hx   Anxiety    takes Klonopin  daily as needed.takes Celexa  daily   Arthritis    left  foot and hands   Asthma    exercise induced   Back pain    Bilateral swelling of feet    Complication of anesthesia    slow to wake  up   Diabetes mellitus without complication (HCC)    Diastolic dysfunction 11/2019   per patient    Granuloma annulare    Herpes infection    High cholesterol    takes Pravastatin  daily   History of bronchitis    several yrs ago   History of migraine    last one about 2 wks ago   Hypertension    IBS (irritable bowel syndrome)    takes Trulance  and ProBiotic Daily   Inflammation of hair follicles    takes Minocycline daily   Insomnia    takes Melatonin nightly   Joint pain    Lactose intolerance    Liver fibrosis    Menopausal state    Migraine    NASH (nonalcoholic steatohepatitis)    Prediabetes 01/01/2020   Sleep apnea    sleep study > 5 yrs ago. no CPAP use    Sleep apnea    Spastic bladder    takes Myrbetriq  daily   Tingling    r/t neck. Pseudoarthrosis    Tubular adenoma of colon    UTI (urinary tract infection)    hx of but takes Macrodantin  daily. has been on for 2 1/2 yrs     PSH: Past Surgical History:  Procedure Laterality Date   ABDOMINAL HYSTERECTOMY  2005   complete hysterectomy both ovaries removed   BIOPSY  10/14/2021   Procedure: BIOPSY;  Surgeon: Albertus Gordy HERO, MD;  Location: [facility];  Service: Gastroenterology;;   BREAST REDUCTION SURGERY  2009   CARPAL TUNNEL RELEASE     carpel tunnel Bilateral 02/2017, 2021   CERVICAL DISC SURGERY  2007, 2009, 2014, 2017   anterior  x 3 , posterior x 2    CESAREAN SECTION  94/03   x 2   COLONOSCOPY     COLONOSCOPY WITH PROPOFOL  N/A 10/14/2021   Procedure: COLONOSCOPY WITH PROPOFOL ;  Surgeon: Albertus Gordy HERO, MD;  Location: [facility];  Service: Gastroenterology;  Laterality: N/A;   ESOPHAGOGASTRODUODENOSCOPY     POLYPECTOMY  10/14/2021   Procedure: POLYPECTOMY;  Surgeon: Albertus Gordy HERO, MD;  Location: [facility];  Service: Gastroenterology;;   POSTERIOR CERVICAL  FUSION/FORAMINOTOMY N/A 01/07/2013   Procedure: POSTERIOR CERVICAL FUSION/FORAMINOTOMY LEVEL 2;  Surgeon: Lamar LELON Peaches, MD;  Location: [facility];  Service: Neurosurgery;  Laterality: N/A;  Posterior Cervical Five-Six/Six-Seven Arthrodesis with Instrumentation   POSTERIOR CERVICAL FUSION/FORAMINOTOMY N/A 07/04/2016   Procedure: CERVICAL THREE-FOUR POSTERIOR CERVICAL ARTHRODESIS;  Surgeon: Lamar Peaches, MD;  Location: [facility] OR;  Service: Neurosurgery;  Laterality: N/A;  C3-C4 POSTERIOR CERVICAL ARTHRODESIS   tummy tuck     TYMPANOSTOMY TUBE PLACEMENT Bilateral    As a baby   UPPER GASTROINTESTINAL ENDOSCOPY      SH: Social History   Tobacco Use   Smoking status: Never  Smokeless tobacco: Never  Vaping Use   Vaping status: Never Used  Substance Use Topics   Alcohol use: Yes    Alcohol/week: 1.0 standard drink of alcohol    Types: 1 Glasses of wine per week    Comment: occasional   Drug use: No    ROS: Constitutional:  Negative for fever, chills, weight loss CV: Negative for chest pain, previous MI, hypertension Respiratory:  Negative for shortness of breath, wheezing, sleep apnea, frequent cough GI:  Negative for nausea, vomiting, bloody stool, GERD  PE: There were no vitals taken for this visit. GENERAL APPEARANCE:  Well appearing, well developed, well nourished, NAD HEENT:  Atraumatic, normocephalic, oropharynx clear NECK:  Supple without lymphadenopathy or thyromegaly ABDOMEN:  Soft, non-tender, no masses EXTREMITIES:  Moves all extremities well, without clubbing, cyanosis, or edema NEUROLOGIC:  Alert and oriented x 3, normal gait, CN II-XII grossly intact MENTAL STATUS:  appropriate BACK:  Non-tender to palpation, No CVAT SKIN:  Warm, dry, and intact   Results: U/A:

## 2024-02-26 ENCOUNTER — Telehealth: Payer: Self-pay | Admitting: *Deleted

## 2024-02-26 NOTE — Telephone Encounter (Signed)
-----   Message from [name] sent at 01/30/2024  1:31 PM EDT ----- [name]

## 2024-02-26 NOTE — Telephone Encounter (Signed)
 L/m for patient to contact me. Trying to get auth from Ins for the 2nd attempt and received fax patient has another Rx coverage

## 2024-03-08 ENCOUNTER — Telehealth: Payer: Self-pay

## 2024-03-08 NOTE — Telephone Encounter (Signed)
 Patient needs blood work ordered for her up coming appt August 20th

## 2024-03-09 LAB — IRON,TIBC AND FERRITIN PANEL
Ferritin: 173 ng/mL — ABNORMAL HIGH (ref 15–150)
Iron Saturation: 92 % (ref 15–55)
Iron: 278 ug/dL (ref 27–159)
Total Iron Binding Capacity: 302 ug/dL (ref 250–450)
UIBC: 24 ug/dL — ABNORMAL LOW (ref 131–425)

## 2024-03-09 LAB — CBC WITH DIFFERENTIAL/PLATELET
Basophils Absolute: 0 x10E3/uL (ref 0.0–0.2)
Basos: 0 %
EOS (ABSOLUTE): 0.1 x10E3/uL (ref 0.0–0.4)
Eos: 1 %
Hematocrit: 44.1 % (ref 34.0–46.6)
Hemoglobin: 14.3 g/dL (ref 11.1–15.9)
Immature Grans (Abs): 0 x10E3/uL (ref 0.0–0.1)
Immature Granulocytes: 0 %
Lymphocytes Absolute: 4.3 x10E3/uL — ABNORMAL HIGH (ref 0.7–3.1)
Lymphs: 47 %
MCH: 31.9 pg (ref 26.6–33.0)
MCHC: 32.4 g/dL (ref 31.5–35.7)
MCV: 98 fL — ABNORMAL HIGH (ref 79–97)
Monocytes Absolute: 0.8 x10E3/uL (ref 0.1–0.9)
Monocytes: 9 %
Neutrophils Absolute: 4 x10E3/uL (ref 1.4–7.0)
Neutrophils: 43 %
Platelets: 352 x10E3/uL (ref 150–450)
RBC: 4.48 x10E6/uL (ref 3.77–5.28)
RDW: 13.4 % (ref 11.7–15.4)
WBC: 9.2 x10E3/uL (ref 3.4–10.8)

## 2024-03-11 ENCOUNTER — Other Ambulatory Visit: Payer: Self-pay

## 2024-03-11 DIAGNOSIS — I10 Essential (primary) hypertension: Secondary | ICD-10-CM

## 2024-03-11 DIAGNOSIS — Z79899 Other long term (current) drug therapy: Secondary | ICD-10-CM

## 2024-03-11 DIAGNOSIS — E119 Type 2 diabetes mellitus without complications: Secondary | ICD-10-CM

## 2024-03-11 DIAGNOSIS — E785 Hyperlipidemia, unspecified: Secondary | ICD-10-CM

## 2024-03-11 NOTE — Telephone Encounter (Signed)
 Lipid, liver, metabolic 7, [name] Hyperlipidemia, high risk med, prediabetes

## 2024-03-11 NOTE — Telephone Encounter (Signed)
 Patient has been made aware.

## 2024-03-12 NOTE — Addendum Note (Signed)
 Addended by: IVA MARTY SALTNESS on: 03/12/2024 03:58 PM   Modules accepted: Orders

## 2024-03-13 ENCOUNTER — Ambulatory Visit (INDEPENDENT_AMBULATORY_CARE_PROVIDER_SITE_OTHER): Admitting: Family Medicine

## 2024-03-13 ENCOUNTER — Encounter: Payer: Self-pay | Admitting: Family Medicine

## 2024-03-13 VITALS — BP 128/80 | HR 88 | Temp 97.3°F | Ht 65.0 in | Wt 193.0 lb

## 2024-03-13 DIAGNOSIS — E119 Type 2 diabetes mellitus without complications: Secondary | ICD-10-CM

## 2024-03-13 DIAGNOSIS — I10 Essential (primary) hypertension: Secondary | ICD-10-CM

## 2024-03-13 DIAGNOSIS — F411 Generalized anxiety disorder: Secondary | ICD-10-CM

## 2024-03-13 DIAGNOSIS — E785 Hyperlipidemia, unspecified: Secondary | ICD-10-CM

## 2024-03-13 DIAGNOSIS — E1169 Type 2 diabetes mellitus with other specified complication: Secondary | ICD-10-CM

## 2024-03-13 DIAGNOSIS — D692 Other nonthrombocytopenic purpura: Secondary | ICD-10-CM

## 2024-03-13 DIAGNOSIS — K7581 Nonalcoholic steatohepatitis (NASH): Secondary | ICD-10-CM | POA: Diagnosis not present

## 2024-03-13 MED ORDER — CITALOPRAM HYDROBROMIDE 40 MG PO TABS: 40.0000 mg | ORAL_TABLET | Freq: Every day | ORAL | 1 refills | Status: AC

## 2024-03-13 MED ORDER — LISINOPRIL 20 MG PO TABS: 20.0000 mg | ORAL_TABLET | Freq: Every day | ORAL | 1 refills | Status: AC

## 2024-03-13 MED ORDER — FUROSEMIDE 20 MG PO TABS: ORAL_TABLET | ORAL | 1 refills | Status: AC

## 2024-03-13 MED ORDER — CLONAZEPAM 1 MG PO TABS: ORAL_TABLET | ORAL | 3 refills | Status: AC

## 2024-03-13 MED ORDER — ROSUVASTATIN CALCIUM 20 MG PO TABS: 20.0000 mg | ORAL_TABLET | Freq: Every day | ORAL | 1 refills | Status: AC

## 2024-03-13 MED ORDER — ACAMPROSATE CALCIUM 333 MG PO TBEC: 666.0000 mg | DELAYED_RELEASE_TABLET | Freq: Three times a day (TID) | ORAL | 1 refills | Status: AC

## 2024-03-13 NOTE — Progress Notes (Signed)
 Subjective:    Patient ID: [name], female    DOB: 1971-02-20, 53 y.o.   MRN: [id number]  HPI 6 month f/u  HTN NASH Hypothyroidism DM2 Alcohol use Hyperlipidemia    Patient relates she is trying the best she can stay away from alcohol she is interested in starting Campral  again She also has type 2 diabetes hyperlipidemia hypertension fatty liver disease and NASH We have labs ordered she will do these in mid September She is trying to work on diet she has lost some weight due to recent sickness with diarrhea Recently had a fall with a twisted ankle Has bruising on her skin as well as skin tears but these are doing better Unfortunately the patient did take a fall that was associated with alcohol She understands the importance of staying away from alcohol She states she is willing to restart Campral  Past Medical History:  Diagnosis Date   Allergy     Anemia    hx   Anxiety    takes Klonopin  daily as needed.takes Celexa  daily   Arthritis    left foot and hands   Asthma    exercise induced   Back pain    Bilateral swelling of feet    Complication of anesthesia    slow to wake  up   Diabetes mellitus without complication (HCC)    Diastolic dysfunction 11/2019   per patient    Granuloma annulare    Herpes infection    High cholesterol    takes Pravastatin  daily   History of bronchitis    several yrs ago   History of migraine    last one about 2 wks ago   Hypertension    IBS (irritable bowel syndrome)    takes Trulance  and ProBiotic Daily   Inflammation of hair follicles    takes Minocycline daily   Insomnia    takes Melatonin nightly   Joint pain    Lactose intolerance    Liver fibrosis    Menopausal state    Migraine    NASH (nonalcoholic steatohepatitis)    Prediabetes 01/01/2020   Sleep apnea    sleep study > 5 yrs ago. no CPAP use    Sleep apnea    Spastic bladder    takes Myrbetriq  daily   Tingling    r/t neck. Pseudoarthrosis    Tubular  adenoma of colon    UTI (urinary tract infection)    hx of but takes Macrodantin  daily. has been on for 2 1/2 yrs    Current Outpatient Medications on File Prior to Visit  Medication Sig Dispense Refill   acyclovir  (ZOVIRAX ) 400 MG tablet TAKE 1 TABLET BY MOUTH EVERY DAY 90 tablet 1   albuterol  (VENTOLIN  HFA) 108 (90 Base) MCG/ACT inhaler Inhale 2 puffs into the lungs every 4 (four) hours as needed for wheezing or shortness of breath. 18 g 1   azaTHIOprine  (IMURAN ) 50 MG tablet TAKE 1 TABLET BY MOUTH EVERY DAY 90 tablet 1   cetirizine  (ZYRTEC ) 10 MG tablet TAKE 1 TABLET BY MOUTH EVERY DAY 90 tablet 1   Cholecalciferol  (VITAMIN D3) 125 MCG (5000 UT) CAPS Take 25,000 Units by mouth in the morning.     Clobetasol  Propionate 0.05 % shampoo APPLY TOPICALLY DAILY AS NEEDED 118 mL 5   Coenzyme Q10 (COQ10 PO) Take 300 mg by mouth in the morning.     cyclobenzaprine  (FLEXERIL ) 10 MG tablet Take 0.5-1 tablets (5-10 mg total) by mouth 3 (three) times  daily as needed for muscle spasms. 50 tablet 0   dicyclomine  (BENTYL ) 20 MG tablet TAKE 1 TABLET (20 MG TOTAL) BY MOUTH 3 (THREE) TIMES DAILY BEFORE MEALS. 270 tablet 1   EC-NAPROSYN 500 MG EC tablet Take by mouth.     Eluxadoline  (VIBERZI ) 75 MG TABS Take 1 tablet by mouth 2 (two) times daily. 180 tablet 0   estradiol  (ESTRACE ) 0.1 MG/GM vaginal cream Apply 3x weekly as directed with pea sized amount applied with finger tip.  Do not use vaginal applicator. 42.5 g 12   famotidine  (PEPCID ) 20 MG tablet Take 1 tablet (20 mg total) by mouth 2 (two) times daily. 180 tablet 3   fluticasone  (FLONASE ) 50 MCG/ACT nasal spray PLACE 1 SPRAY INTO BOTH NOSTRILS DAILY AS NEEDED FOR ALLERGIES OR RHINITIS. 48 mL 1   ibuprofen (ADVIL,MOTRIN) 800 MG tablet Take 800 mg by mouth every 8 (eight) hours as needed. for pain  3   levocetirizine (XYZAL ) 5 MG tablet Take 1 tablet (5 mg total) by mouth daily. 90 tablet 3   MAGNESIUM -POTASSIUM PO Take 1 tablet by mouth daily. With Zinc       nitrofurantoin , macrocrystal-monohydrate, (MACROBID ) 100 MG capsule Take 1 capsule (100 mg total) by mouth as needed (following intercourse). 15 capsule 2   NON FORMULARY 1 Dose by Implant route every 3 (three) months.     solifenacin  (VESICARE ) 10 MG tablet TAKE 1 TABLET BY MOUTH EVERY DAY 90 tablet 3   triamcinolone  ointment (KENALOG ) 0.5 % Apply 1 Application topically 2 (two) times daily as needed. 30 g 5   TURMERIC PO Take 1 capsule by mouth in the morning and at bedtime.     valACYclovir  (VALTREX ) 1000 MG tablet TAKE 2 TABLETS BY MOUTH NOW THEN 2 TABLETS BY MOUTH 12 HOURS LATER (Patient taking differently: Take 1,000 mg by mouth 2 (two) times daily as needed (outbreaks).) 4 tablet 4   vitamin E  180 MG (400 UNITS) capsule Take 400 Units by mouth in the morning and at bedtime.     Zinc  50 MG TABS Take 1 tablet by mouth daily.     meloxicam (MOBIC) 7.5 MG tablet Take 7.5 mg by mouth daily.     methocarbamol (ROBAXIN) 500 MG tablet Take 500 mg by mouth 4 (four) times daily.     ondansetron  (ZOFRAN ) 8 MG tablet Take 1 tablet (8 mg total) by mouth every 8 (eight) hours as needed for nausea or vomiting. 15 tablet 0   phenazopyridine  (PYRIDIUM ) 100 MG tablet Take 1 tablet (100 mg total) by mouth 3 (three) times daily as needed for pain. 12 tablet 0   promethazine  (PHENERGAN ) 25 MG tablet Take 25 mg by mouth every 4 (four) hours as needed.     No current facility-administered medications on file prior to visit.      Review of Systems     Objective:   Physical Exam  General-in no acute distress Eyes-no discharge Lungs-respiratory rate normal, CTA CV-no murmurs,RRR Extremities skin warm dry no edema Neuro grossly normal Behavior normal, alert Senile purpura noted on arms      Assessment & Plan:   1. Essential hypertension (Primary) Blood pressure decent control continue current measures  2. Type 2 diabetes mellitus without complication, without long-term current use of insulin  (HCC) Patient did not tolerate metformin  and had GI side effects she is trying to watch her diet check [name] await the results  3. Hyperlipidemia associated with type 2 diabetes mellitus (HCC) Check lipid profile  healthy diet continue medication  4. NASH (nonalcoholic steatohepatitis) Stay away from alcohol fit in regular exercise watch diet, check liver functions  5. GAD (generalized anxiety disorder) Uses Klonopin  twice daily not to exceed that dosing  She relates that she will restart acamprosate   Follow-up in approximately 6 months sooner problems  Consider GLP-1 medicines depending on result of her [name] patient relates that she in the past did not like taking Ozempic  but she is open to GLP-1 perhaps Mounjaro

## 2024-03-17 ENCOUNTER — Other Ambulatory Visit: Payer: Self-pay | Admitting: Urology

## 2024-03-17 DIAGNOSIS — N390 Urinary tract infection, site not specified: Secondary | ICD-10-CM

## 2024-04-12 NOTE — Telephone Encounter (Signed)
 Patient didn't call me back with new Ins. She needs to give us  updated card with prescription coverage update.. she has two appts scheduled next week

## 2024-04-16 ENCOUNTER — Ambulatory Visit: Payer: Self-pay | Admitting: Family Medicine

## 2024-04-16 ENCOUNTER — Other Ambulatory Visit: Payer: Self-pay | Admitting: Obstetrics & Gynecology

## 2024-04-16 ENCOUNTER — Other Ambulatory Visit: Payer: Self-pay | Admitting: Allergy & Immunology

## 2024-04-16 DIAGNOSIS — N3941 Urge incontinence: Secondary | ICD-10-CM

## 2024-04-16 DIAGNOSIS — E119 Type 2 diabetes mellitus without complications: Secondary | ICD-10-CM

## 2024-04-16 DIAGNOSIS — Z79899 Other long term (current) drug therapy: Secondary | ICD-10-CM

## 2024-04-16 DIAGNOSIS — E1169 Type 2 diabetes mellitus with other specified complication: Secondary | ICD-10-CM

## 2024-04-16 DIAGNOSIS — I10 Essential (primary) hypertension: Secondary | ICD-10-CM

## 2024-04-16 LAB — BASIC METABOLIC PANEL WITH GFR
BUN/Creatinine Ratio: 13 (ref 9–23)
BUN: 13 mg/dL (ref 6–24)
CO2: 24 mmol/L (ref 20–29)
Calcium: 9.3 mg/dL (ref 8.7–10.2)
Chloride: 102 mmol/L (ref 96–106)
Creatinine, Ser: 0.99 mg/dL (ref 0.57–1.00)
Glucose: 88 mg/dL (ref 70–99)
Potassium: 4.5 mmol/L (ref 3.5–5.2)
Sodium: 139 mmol/L (ref 134–144)
eGFR: 68 mL/min/1.73 (ref >59)

## 2024-04-16 LAB — HEPATIC FUNCTION PANEL
ALT: 18 IU/L (ref 0–32)
AST: 20 IU/L (ref 0–40)
Albumin: 4.3 g/dL (ref 3.8–4.9)
Alkaline Phosphatase: 85 IU/L (ref 49–135)
Bilirubin Total: 0.4 mg/dL (ref 0.0–1.2)
Bilirubin, Direct: 0.12 mg/dL (ref 0.00–0.40)
Total Protein: 6.9 g/dL (ref 6.0–8.5)

## 2024-04-16 LAB — LIPID PANEL
Chol/HDL Ratio: 4.3 ratio (ref 0.0–4.4)
Cholesterol, Total: 187 mg/dL (ref 100–199)
HDL: 43 mg/dL (ref >39)
LDL Chol Calc (NIH): 106 mg/dL — ABNORMAL HIGH (ref 0–99)
Triglycerides: 219 mg/dL — ABNORMAL HIGH (ref 0–149)
VLDL Cholesterol Cal: 38 mg/dL (ref 5–40)

## 2024-04-16 LAB — HEMOGLOBIN A1C
Est. average glucose Bld gHb Est-mCnc: 117 mg/dL
Hgb A1c MFr Bld: 5.7 % — ABNORMAL HIGH (ref 4.8–5.6)

## 2024-04-16 LAB — IRON,TIBC AND FERRITIN PANEL
Ferritin: 114 ng/mL (ref 15–150)
Iron Saturation: 32 % (ref 15–55)
Iron: 84 ug/dL (ref 27–159)
Total Iron Binding Capacity: 260 ug/dL (ref 250–450)
UIBC: 176 ug/dL (ref 131–425)

## 2024-04-18 ENCOUNTER — Other Ambulatory Visit: Payer: Self-pay | Admitting: Family Medicine

## 2024-04-18 ENCOUNTER — Ambulatory Visit: Payer: Managed Care, Other (non HMO) | Admitting: Urology

## 2024-04-22 ENCOUNTER — Ambulatory Visit (INDEPENDENT_AMBULATORY_CARE_PROVIDER_SITE_OTHER): Admitting: Family

## 2024-04-22 ENCOUNTER — Encounter: Payer: Self-pay | Admitting: Family

## 2024-04-22 DIAGNOSIS — L2389 Allergic contact dermatitis due to other agents: Secondary | ICD-10-CM | POA: Diagnosis not present

## 2024-04-22 NOTE — Progress Notes (Signed)
 Follow-up Note  RE: [name] MRN: [id number]  DOB: 1971/01/04 Date of Office Visit: 04/22/24  Primary care provider: Alphonsa Glendia LABOR., MD Referring provider: Alphonsa Glendia LABOR., MD   [name] returns to the office today for the patch test placement, given suspected history of contact dermatitis.    Diagnostics: NAC 80 patches placed NAC-80 (1-80)   1. Ammonium persulfate  2. [place] Balsam  3. Omitted  4. 4-tert-Butylphenolformaldehyde resin (PTBP)  5. Bacitracin   6. Budesonide   7. Quaternium-15  8. Cinnamal  9. Cobalt(II) chloride hexahydrate  10. Colophonium  11. Methyldibromo glutaronitrile  12. Decyl Glucoside  13. Ethylenediamine dihydrochloride  14. 2-Hydroxyethyl methacrylate  15. Hydroperoxides of Linalool  16. Iodopropynyl butylcarbamate  17. 2-Mercaptobenzothiazole (MBT)  18. Thiuram mix  19. METHYLISOTHIAZOLINONE  20. Propylene glycol  21. 1,3-Diphenylguanidine  22. Hydroperoxides of Limonene  23. Black rubber mix  24. Carba mix  25. Fragrance mix I  26. Fragrance mix II  27. Textile dye mix II  28. Neomycin sulfate  29. Nickel(II) sulfate hexahydrate  30. p-Phenylenediamine (PPD)  31. Potassium dichromate  32. Propolis  33. Sodium Metabisulfite  34. Tixocortol-21-pivalate  35. Lanolin alcohol  36. Methylisothiazolinone + Methylchloroisothiazolinone  37. Cocamidopropyl betaine  38. 3-(Dimethylamino)-1-propylamine  39. Formaldehyde  40. Oleamidopropyl dimethylamine  41. 2-Bromo-2-Nitropropane-l,3-diol  42. Diazolidinyl urea  43. DMDM Hydantoin  44. Epoxy resin, Bisphenol A  45. Benzophenone-4  46. Imidazolidinyl urea  47. Lauryl polyglucose  48 Methyl methacrylate  49. Paraben mix  50. Mercapto mix  51. Caine mix III  52. Mixed dialkyl thiourea  53. Compositae mix II  54. Toluenesulfonamide formaldehyde resin  55. Tea Tree Oil oxidized  56. Ylang-Ylang oil  57. Amidoamine  58. Amerchol L 101  59. Benzocaine  60. Benzyl alchohol   61. Benzyl salicylate  62. Chloroxylenol (PCMX)  63. Cocamide DEA  64. Clobetasol -17-propionate  65. Toluene-2,5-Diamine sulfate  66. Ethyl acrylate  67. N-Isopropyl-N-phenyl--4-phenylenediamine (IPPD)  68. Lidocaine   69. Omitted  70. Sesquiterpene lactone mix  71. 2-n-Octyl-4-isothiazolin-3-one  72. Propyl gallate  73. Polymyxin B sulfate  74. Pramoxine hydrochloride  75. Sodium benzoate  76. Sorbitan oleate  77. Sorbitan sesquioleate  78. Tocopherol  79. BENZALKONIUM CHLORIDE  80. Chlorhexidine  digluconate    Allergic contact dermatitis - Instructions provided on care of the patches for the next 48 hours. - [name] was instructed to avoid showering for the next 48 hours. - [name] will follow up in 48 hours and 96 hours for patch readings.   Wanda Craze, FNP [facility]

## 2024-04-22 NOTE — Addendum Note (Signed)
 Addended by: NANCEE JON SAILOR on: 04/22/2024 11:31 AM   Modules accepted: Orders

## 2024-04-24 ENCOUNTER — Ambulatory Visit: Admitting: Internal Medicine

## 2024-04-24 DIAGNOSIS — L2389 Allergic contact dermatitis due to other agents: Secondary | ICD-10-CM

## 2024-04-24 NOTE — Progress Notes (Unsigned)
 Follow Up Note  RE: [name] MRN: [id number] DOB: 10-18-1970 Date of Office Visit: 04/24/2024  Referring provider: Alphonsa Glendia LABOR, MD Primary care provider: Alphonsa Glendia LABOR, MD  History of Present Illness: I had the pleasure of seeing [name] for a follow up visit at the [facility] on 04/25/2024. She is a 53 y.o. female, who is being followed for contact dermatitis . Today she is here for initial patch test interpretation, given suspected history of contact dermatitis.   Diagnostics:  NAC 80  48 hour reading: positive to colbalt (III)chloride hexahydrate, fragrance mix 1, and propolis  NAC-80 - 04/24/24 0955     NAC Reading Interval Day 3    NAC Panel Tested NAC-80 (1-80)    7. Quaternium-15 Negative    8. Cinnamal Negative    9. Cobalt(II) chloride hexahydrate 2+    10. Colophonium Negative    11. Methyldibromo glutaronitrile Negative    12. Decyl Glucoside Negative    13. Ethylenediamine dihydrochloride Negative    14. 2-Hydroxyethyl methacrylate Negative    15. Hydroperoxides of Linalool Negative    16. Iodopropynyl butylcarbamate Negative    17. 2-Mercaptobenzothiazole (MBT) Negative    18. Thiuram mix Negative    19. METHYLISOTHIAZOLINONE Negative    20. Propylene glycol Negative    21. 1,3-Diphenylguanidine Negative    22. Hydroperoxides of Limonene Negative    23. Black rubber mix Negative    24. Carba mix Negative    25. Fragrance mix I 2+    26. Fragrance mix II Negative    27. Textile dye mix II Negative    28. Neomycin sulfate Negative    29. Nickel(II) sulfate hexahydrate Negative    30. p-Phenylenediamine (PPD) Negative    31. Potassium dichromate Negative    32. Propolis 2+    33. Sodium Metabisulfite Negative    34. Tixocortol-21-pivalate Negative    35. Lanolin alcohol Negative    36. Methylisothiazolinone + Methylchloroisothiazolinone Negative    37. Cocamidopropyl betaine Negative    38.  3-(Dimethylamino)-1-propylamine Negative    39. Formaldehyde Negative    40. Oleamidopropyl dimethylamine Negative    41. 2-Bromo-2-Nitropropane-l,3-diol Negative    42. Diazolidinyl urea Negative    43. DMDM Hydantoin Negative    44. Epoxy resin, Bisphenol A Negative    45. Benzophenone-4 Negative    46. Imidazolidinyl urea Negative    47. Lauryl polyglucose Negative    48 Methyl methacrylate Negative    49. Paraben mix Negative    50. Mercapto mix Negative    51. Caine mix III Negative    52. Mixed dialkyl thiourea Negative    53. Compositae mix II Negative    54. Toluenesulfonamide formaldehyde resin Negative    55. Tea Tree Oil oxidized Negative    56. Ylang-Ylang oil Negative    57. Amidoamine Negative    58. Amerchol L 101 Negative    59. Benzocaine Negative    60. Benzyl alchohol Negative    61. Benzyl salicylate Negative    62. Chloroxylenol (PCMX) Negative    63. Cocamide DEA Negative    64. Clobetasol -17-propionate Negative    65. Toluene-2,5-Diamine sulfate Negative    66. Ethyl acrylate Negative    67. N-Isopropyl-N-phenyl--4-phenylenediamine (IPPD) Negative    68. Lidocaine  Negative    69. Hydroxyisohexyl 3-Cyclohexene Carboxaldehyde Negative    70. Sesquiterpene lactone mix Negative    71. 2-n-Octyl-4-isothiazolin-3-one Negative    72. Propyl gallate Negative  73. Polymyxin B sulfate Negative    74. Pramoxine hydrochloride Negative    75. Sodium benzoate Negative    76. Sorbitan oleate Negative    77. Sorbitan sesquioleate Negative    78. Tocopherol Negative    79. BENZALKONIUM CHLORIDE Negative    80. Chlorhexidine  digluconate Negative           Assessment and Plan: [name] is a 53 y.o. female with: Concern for Contact Dermatitis:  The patient has been provided detailed information regarding the substances she is sensitive to, as well as products containing the substances.  Meticulous avoidance of these substances is recommended. If avoidance is  not possible, the use of barrier creams or lotions is recommended. If symptoms persist or progress despite meticulous avoidance of chemicals/substances above, dermatology evaluation may be warranted. No follow-ups on file.  It was my pleasure to see [name] today and participate in her care. Please feel free to contact me with any questions or concerns.  Sincerely,   Hargis Springer, MD [facility]

## 2024-04-26 ENCOUNTER — Encounter: Payer: Self-pay | Admitting: Family

## 2024-04-26 ENCOUNTER — Ambulatory Visit: Admitting: Family

## 2024-04-26 DIAGNOSIS — L235 Allergic contact dermatitis due to other chemical products: Secondary | ICD-10-CM | POA: Diagnosis not present

## 2024-04-26 NOTE — Progress Notes (Signed)
 Follow-up Note  RE: [name] MRN: [id number]  DOB: May 18, 1971 Date of Office Visit: 04/26/24  Primary care provider: Alphonsa Glendia LABOR., MD Referring provider: Alphonsa Glendia LABOR., MD   Esterlene returns to the office today for the final patch test interpretation, given suspected history of contact dermatitis.    Diagnostics:    NAC 80 48-hour reading:  NAC Panel Tested NAC-80 (1-80)   1. Ammonium persulfate Negative   2. [place] Balsam Negative   3. BENZISOTHIAZOLINONE Comment   omit  4. 4-tert-Butylphenolformaldehyde resin (PTBP) Negative   5. Bacitracin  Negative   6. Budesonide  Negative   7. Quaternium-15 Negative   8. Cinnamal Negative   9. Cobalt(II) chloride hexahydrate Negative  10. Colophonium Negative   11. Methyldibromo glutaronitrile Negative   12. Decyl Glucoside Negative   13. Ethylenediamine dihydrochloride Negative   14. 2-Hydroxyethyl methacrylate Negative   15. Hydroperoxides of Linalool Negative   16. Iodopropynyl butylcarbamate Negative   17. 2-Mercaptobenzothiazole (MBT) Negative   18. Thiuram mix Negative   19. METHYLISOTHIAZOLINONE 2+  20. Propylene glycol Negative   21. 1,3-Diphenylguanidine Negative   22. Hydroperoxides of Limonene Negative   23. Black rubber mix Negative   24. Carba mix Negative   25. Fragrance mix I 2+  26. Fragrance mix II Negative   27. Textile dye mix II Negative   28. Neomycin sulfate Negative   29. Nickel(II) sulfate hexahydrate Negative  30. p-Phenylenediamine (PPD) Negative   31. Potassium dichromate Negative   32. Propolis Negative   33. Sodium Metabisulfite Negative   34. Tixocortol-21-pivalate Negative   35. Lanolin alcohol Negative   36. Methylisothiazolinone + Methylchloroisothiazolinone Negative   37. Cocamidopropyl betaine Negative   38. 3-(Dimethylamino)-1-propylamine Negative   39. Formaldehyde Negative  40. Oleamidopropyl dimethylamine Negative   41. 2-Bromo-2-Nitropropane-l,3-diol Negative   42.  Diazolidinyl urea Negative  43. DMDM Hydantoin Negative   44. Epoxy resin, Bisphenol A Negative   45. Benzophenone-4 Negative   46. Imidazolidinyl urea Negative   47. Lauryl polyglucose Negative  48 Methyl methacrylate Negative   49. Paraben mix Negative   50. Mercapto mix Negative   51. Caine mix III Negative   52. Mixed dialkyl thiourea Negative   53. Compositae mix II Negative   54. Toluenesulfonamide formaldehyde resin Negative   55. Tea Tree Oil oxidized Negative   56. Ylang-Ylang oil Negative   57. Amidoamine Negative   58. Amerchol L 101 Negative   59. Benzocaine Negative   60. Benzyl alchohol Negative   61. Benzyl salicylate Negative   62. Chloroxylenol (PCMX) Negative   63. Cocamide DEA Negative   64. Clobetasol -17-propionate Negative   65. Toluene-2,5-Diamine sulfate Negative   66. Ethyl acrylate Negative   67. N-Isopropyl-N-phenyl--4-phenylenediamine (IPPD) Negative   68. Lidocaine  Negative   69. Hydroxyisohexyl 3-Cyclohexene Carboxaldehyde Comment   omit  70. Sesquiterpene lactone mix Negative   71. 2-n-Octyl-4-isothiazolin-3-one Negative   72. Propyl gallate Negative   73. Polymyxin B sulfate Negative   74. Pramoxine hydrochloride Negative   75. Sodium benzoate Negative   76. Sorbitan oleate Negative   77. Sorbitan sesquioleate Negative   78. Tocopherol Negative   79. BENZALKONIUM CHLORIDE Negative   80. Chlorhexidine  digluconate Negative     Plan:   Allergic contact dermatitis - The patient has been provided detailed information regarding the substances she is sensitive to, as well as products containing the substances.   - Meticulous avoidance of these substances is recommended.  - If avoidance is not possible, the use  of barrier creams or lotions is recommended. - If symptoms persist or progress despite meticulous avoidance of these substances, a dermatology referral may be warranted. - The sensitivity of patch testing can range from 60-80% and  therefore false negative results are possible. Therefore, this does not definitively rule out contact dermatitis.   Thank you for the opportunity to care for this patient.  Please do not hesitate to contact me with questions.  Wanda Craze, FNP [facility]

## 2024-05-07 ENCOUNTER — Encounter: Payer: Self-pay | Admitting: Obstetrics & Gynecology

## 2024-05-08 ENCOUNTER — Other Ambulatory Visit: Payer: Self-pay | Admitting: Family Medicine

## 2024-05-08 MED ORDER — EZETIMIBE 10 MG PO TABS: 10.0000 mg | ORAL_TABLET | Freq: Every day | ORAL | 3 refills | Status: AC

## 2024-05-08 NOTE — Telephone Encounter (Signed)
 Nurses I sent in her new medicine Zetia 10 mg 1 daily Please have her repeat lab work before her follow-up visit in February She can do the lab work a week ahead of her future office visit  Metabolic 7, [name], lipid, liver  Diagnosis diabetes hyperlipidemia elevated liver enzymes Please send her notice regarding this Thanks-Dr. Glendia

## 2024-05-10 ENCOUNTER — Other Ambulatory Visit: Payer: Self-pay | Admitting: Obstetrics & Gynecology

## 2024-05-10 DIAGNOSIS — N941 Unspecified dyspareunia: Secondary | ICD-10-CM

## 2024-05-10 DIAGNOSIS — N3289 Other specified disorders of bladder: Secondary | ICD-10-CM

## 2024-05-10 DIAGNOSIS — N3941 Urge incontinence: Secondary | ICD-10-CM

## 2024-05-13 ENCOUNTER — Ambulatory Visit: Admitting: Urology

## 2024-05-13 NOTE — Progress Notes (Deleted)
 Assessment: No diagnosis found.   Plan: I personally reviewed the patient's chart including provider notes, lab and imaging results. Recent MRI results discussed with the patient today.  The renal cysts do not require further evaluation.  I reviewed methods to reduce the risk of UTIs with the patient today.  We discussed increase fluid intake, timed and double voiding, daily cranberry supplement, daily probiotic, vaginal estrogen cream and postcoital prophylaxis. I encouraged her to use the Estrace  vaginal cream 2-3 times per week. Will change to Macrodantin  for postcoital prophylaxis.  If this is not successful in preventing her UTIs, will consider resuming daily Macrobid . Continue solifenacin  10 mg daily. Return to office in 2 months.  Records from urgent care in [place] requested for review.  Chief Complaint: No chief complaint on file.   HPI: [name] is a 53 y.o. female who presents for continued evaluation of recurrent UTI. She was initially seen by Dr. Shona in May 2024. Patient has had a long history of recurrent urinary tract infections including a history of pyelonephritis when she was in her 78s and recently also questionable febrile UTI.  She has had at least 4 UTIs in the past year and her typical symptoms include dysuria, frequency, and urgency.   Her UTIs are temporally related to sexual intercourse.  She typically has intercourse several times a week. She has previously been on long-term Macrodantin  suppression. She is currently on HRT-implant pellets Patient continues to take vesicare  10mg  for oab (Rx by GYN).  Doing well from this standpoint.   She is also using UTI prevention strategies including urinary probiotic, cranberry suppl and vaginal estrogen cream and post coital bactrim .  She does note a temporal relationship between UTIs and sexual intercourse as well.   In January 2025, she was diagnosed with a citrobacter UTI.  OAB symptoms worse since  UTI.   Had CT a/p with contrast 08/03/23 at [facility]-- IMPRESSION:   1. Diffuse urinary bladder wall thickening, concerning for infectious or inflammatory cystitis.  2.  Tiny locules of gas within the urinary bladder, which can be seen with recent instrumentation/catheterization if there is recent history of such. Otherwise, gas forming infection is a possibility.  3.  Nonobstructing left renal calculi. No hydronephrosis.  4.  Lesion measuring 1.7 cm within the interpolar region of the left kidney. While possibly a complicated renal cyst, nonemergent outpatient renal MRI is suggested for further evaluation to exclude renal neoplasm.  5.  Ancillary and chronic findings successful.   MRI abdomen with and without contrast from 11/27/2023 showed multiple simple and thinly septated renal cortical cysts bilaterally consistent with Bosniak 1 and 2 cysts.  At her visit in May 2025, she continued on Bactrim  postcoital.  She was using Estrace  vaginal cream intermittently.  She continued on solifenacin  10 mg daily with adequate control of her bladder symptoms.  She was diagnosed at urgent care with a UTI.  She was started on nitrofurantoin  x 1 week.  Her symptoms improved.  She is unsure if a culture was obtained.  Portions of the above documentation were copied from a prior visit for review purposes only.  Allergies: Allergies  Allergen Reactions   Cefdinir  Itching   Dairy Aid [Tilactase]     Unsure of reaction.    Dog Epithelium Itching   Gluten Meal Other (See Comments)    Unsure of reaction.   Grass Pollen(K-O-R-T-Swt Vern) Itching    migraine   Levaquin  [Levofloxacin  In D5w] Diarrhea and Nausea And Vomiting  Other     PT STATES NO BLOOD PRODUCTS OR BLOOD TRANSFUSIONS    Topamax  [Topiramate ]     Causes anger   Wheat Other (See Comments)    Unsure of reaction.   Metformin  And Related     Nausea and abdominal discomfort    PMH: Past Medical History:  Diagnosis Date   Allergy     Anemia     hx   Anxiety    takes Klonopin  daily as needed.takes Celexa  daily   Arthritis    left foot and hands   Asthma    exercise induced   Back pain    Bilateral swelling of feet    Complication of anesthesia    slow to wake  up   Diabetes mellitus without complication (HCC)    Diastolic dysfunction 11/2019   per patient    Granuloma annulare    Herpes infection    High cholesterol    takes Pravastatin  daily   History of bronchitis    several yrs ago   History of migraine    last one about 2 wks ago   Hypertension    IBS (irritable bowel syndrome)    takes Trulance  and ProBiotic Daily   Inflammation of hair follicles    takes Minocycline daily   Insomnia    takes Melatonin nightly   Joint pain    Lactose intolerance    Liver fibrosis    Menopausal state    Migraine    NASH (nonalcoholic steatohepatitis)    Prediabetes 01/01/2020   Sleep apnea    sleep study > 5 yrs ago. no CPAP use    Sleep apnea    Spastic bladder    takes Myrbetriq  daily   Tingling    r/t neck. Pseudoarthrosis    Tubular adenoma of colon    UTI (urinary tract infection)    hx of but takes Macrodantin  daily. has been on for 2 1/2 yrs     PSH: Past Surgical History:  Procedure Laterality Date   ABDOMINAL HYSTERECTOMY  2005   complete hysterectomy both ovaries removed   BIOPSY  10/14/2021   Procedure: BIOPSY;  Surgeon: Albertus Gordy HERO, MD;  Location: [facility];  Service: Gastroenterology;;   BREAST REDUCTION SURGERY  2009   CARPAL TUNNEL RELEASE     carpel tunnel Bilateral 02/2017, 2021   CERVICAL DISC SURGERY  2007, 2009, 2014, 2017   anterior  x 3 , posterior x 2    CESAREAN SECTION  94/03   x 2   COLONOSCOPY     COLONOSCOPY WITH PROPOFOL  N/A 10/14/2021   Procedure: COLONOSCOPY WITH PROPOFOL ;  Surgeon: Albertus Gordy HERO, MD;  Location: [facility];  Service: Gastroenterology;  Laterality: N/A;   ESOPHAGOGASTRODUODENOSCOPY     POLYPECTOMY  10/14/2021   Procedure: POLYPECTOMY;  Surgeon:  Albertus Gordy HERO, MD;  Location: [facility];  Service: Gastroenterology;;   POSTERIOR CERVICAL FUSION/FORAMINOTOMY N/A 01/07/2013   Procedure: POSTERIOR CERVICAL FUSION/FORAMINOTOMY LEVEL 2;  Surgeon: Lamar LELON Peaches, MD;  Location: [facility];  Service: Neurosurgery;  Laterality: N/A;  Posterior Cervical Five-Six/Six-Seven Arthrodesis with Instrumentation   POSTERIOR CERVICAL FUSION/FORAMINOTOMY N/A 07/04/2016   Procedure: CERVICAL THREE-FOUR POSTERIOR CERVICAL ARTHRODESIS;  Surgeon: Lamar Peaches, MD;  Location: [facility];  Service: Neurosurgery;  Laterality: N/A;  C3-C4 POSTERIOR CERVICAL ARTHRODESIS   tummy tuck     TYMPANOSTOMY TUBE PLACEMENT Bilateral    As a baby   UPPER GASTROINTESTINAL ENDOSCOPY      SH: Social History  Tobacco Use   Smoking status: Never   Smokeless tobacco: Never  Vaping Use   Vaping status: Never Used  Substance Use Topics   Alcohol use: Yes    Alcohol/week: 1.0 standard drink of alcohol    Types: 1 Glasses of wine per week    Comment: occasional   Drug use: No    ROS: Constitutional:  Negative for fever, chills, weight loss CV: Negative for chest pain, previous MI, hypertension Respiratory:  Negative for shortness of breath, wheezing, sleep apnea, frequent cough GI:  Negative for nausea, vomiting, bloody stool, GERD  PE: There were no vitals taken for this visit. GENERAL APPEARANCE:  Well appearing, well developed, well nourished, NAD HEENT:  Atraumatic, normocephalic, oropharynx clear NECK:  Supple without lymphadenopathy or thyromegaly ABDOMEN:  Soft, non-tender, no masses EXTREMITIES:  Moves all extremities well, without clubbing, cyanosis, or edema NEUROLOGIC:  Alert and oriented x 3, normal gait, CN II-XII grossly intact MENTAL STATUS:  appropriate BACK:  Non-tender to palpation, No CVAT SKIN:  Warm, dry, and intact   Results: U/A:

## 2024-06-01 ENCOUNTER — Other Ambulatory Visit: Payer: Self-pay | Admitting: Allergy & Immunology

## 2024-07-17 ENCOUNTER — Other Ambulatory Visit: Payer: Self-pay | Admitting: Family Medicine

## 2024-07-30 ENCOUNTER — Ambulatory Visit: Admitting: Allergy & Immunology

## 2024-07-30 ENCOUNTER — Other Ambulatory Visit: Payer: Self-pay

## 2024-07-30 VITALS — BP 124/78 | HR 84 | Temp 98.0°F | Resp 18 | Ht 65.0 in | Wt 199.6 lb

## 2024-07-30 DIAGNOSIS — J4520 Mild intermittent asthma, uncomplicated: Secondary | ICD-10-CM

## 2024-07-30 DIAGNOSIS — L235 Allergic contact dermatitis due to other chemical products: Secondary | ICD-10-CM | POA: Diagnosis not present

## 2024-07-30 DIAGNOSIS — L2089 Other atopic dermatitis: Secondary | ICD-10-CM | POA: Diagnosis not present

## 2024-07-30 DIAGNOSIS — J3089 Other allergic rhinitis: Secondary | ICD-10-CM | POA: Diagnosis not present

## 2024-07-30 DIAGNOSIS — J302 Other seasonal allergic rhinitis: Secondary | ICD-10-CM

## 2024-07-30 DIAGNOSIS — L299 Pruritus, unspecified: Secondary | ICD-10-CM

## 2024-07-30 NOTE — Progress Notes (Unsigned)
 "  FOLLOW UP  Date of Service/Encounter:  07/30/2024   Assessment:   Exercise-induced bronchospasm - with normal spirometry    Rash - previously improving with Dupixent  and now transitioning to Rinvoq (failed cyclosporine  and azathioprine )   Perennial and seasonal allergic rhinitis (grass, dog)   Failed multiple topical medications including Eucrisa , Protopic , hydrocortisone, clobetasol , triamcinolone , and cyclosporine    Contact dermatitis - with sensitizations to p-tert butylphenol formaldehyde resin as well as Cl+ Me-Isothiazolinone, gold, cobalt, methylisothiazonlione, fragrance mix 1, and propolis   Hypercholesterolemia - now on a statin with improvement in numbers   Mild transaminitis - with a history of NASH   Currently receiving hormonal replacement therapy   Senile purpura- on arms    Plan/Recommendations:   There are no Patient Instructions on file for this visit.   Subjective:   LEMOYNE SCARPATI is a 54 y.o. female presenting today for follow up of  Chief Complaint  Patient presents with   Follow-up    No complaints     Saryiah E Pendergraft has a history of the following: Patient Active Problem List   Diagnosis Date Noted   Alcohol use disorder, severe, dependence (HCC) 10/17/2022   Allergic contact dermatitis due to chemical 05/20/2022   Syncope 01/19/2022   Family history of colon cancer    Chronic diarrhea    Benign neoplasm of ascending colon    Benign neoplasm of transverse colon    Benign neoplasm of sigmoid colon    Allergic drug reaction 10/09/2020   Pruritic condition 10/09/2020   Vitamin D  deficiency 05/20/2020   Hypertension    Spastic bladder    Prediabetes 01/01/2020   Diastolic dysfunction 12/09/2019   DDD (degenerative disc disease), lumbar 11/13/2019   Spinal stenosis of lumbar region 09/20/2019   Numbness of hand 05/20/2019   Acquired hypothyroidism 07/13/2017   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    Gluten-sensitive enteropathy 07/13/2017   Lactose intolerance in adult 07/13/2017   History of decompression of median nerve 02/17/2017   Carpal tunnel syndrome 12/21/2016   Pseudoarthrosis of cervical spine (HCC) 07/04/2016   History of arthrodesis 08/25/2015   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 07/16/2015   Spinal cord disease (HCC) 07/10/2015   Cervical spondylosis with myelopathy 07/10/2015   DDD (degenerative disc disease), cervical 07/10/2015   Fibromyalgia 07/07/2015   Lumbar spondylosis 03/05/2014   Degeneration of lumbar intervertebral disc 03/05/2014   Paresthesia of lower extremity 02/04/2014   Obesity, unspecified 05/09/2013   Bilateral renal cysts 05/09/2013   Dyspareunia 02/25/2013   Mild intermittent asthma without complication 12/29/2012   Hyperglycemia 11/07/2012   Other malaise and fatigue 11/07/2012   Anxiety state 04/22/2010   GERD 04/22/2010   ILEUS 04/22/2010   IRRITABLE BOWEL SYNDROME 04/22/2010   Sleep apnea 04/22/2010   LIVER FUNCTION TESTS, ABNORMAL, HX OF 04/22/2010   Generalized anxiety disorder 04/22/2010   Hyperlipidemia associated with type 2 diabetes mellitus (HCC) 12/03/2009   Essential hypertension 12/03/2009   Liver cirrhosis secondary to NASH (HCC) 12/03/2009   CHEST PAIN UNSPECIFIED 12/03/2009    History obtained from: chart review and {Persons; PED relatives w/patient:19415::patient}.  Discussed the use of AI scribe software for clinical note transcription with the patient and/or guardian, who gave verbal consent to proceed.  Sorayah is a 54 y.o. female presenting for {Blank single:19197::a food challenge,a drug challenge,skin testing,a sick visit,an evaluation of ***,a follow up visit}.  She underwent a more extensive NAC-80 testing in October 2025.  She  was positive to cobalt, methylisothiazonlione, fragrance mix 1, and propolis.   I last saw her in July 2025.  At that time, she failed  cyclosporine  and azathioprine .  We were finally able to get Rinvoq approved.  We gave her samples at that time.  We recommended she start the 30 mg and then decrease down to 15 mg once her prescription was approved.  We continue with triamcinolone  as well as clobetasol .  Lung testing was not done.  We continued with albuterol  as needed.  For her allergic rhinitis, we continue with cetirizine  as well as levocetirizine and Flonase .  Since last visit,   Asthma/Respiratory Symptom History: ***  Allergic Rhinitis Symptom History: ***  Food Allergy  Symptom History: ***  Skin Symptom History: ***  GERD Symptom History: ***  Infection Symptom History: ***  Otherwise, there have been no changes to her past medical history, surgical history, family history, or social history.    Review of systems otherwise negative other than that mentioned in the HPI.    Objective:   Blood pressure 124/78, pulse 84, temperature 98 F (36.7 C), temperature source Temporal, resp. rate 18, height 5' 5 (1.651 m), weight 199 lb 9.6 oz (90.5 kg), SpO2 98%. Body mass index is 33.22 kg/m.    Physical Exam   Diagnostic studies: {Blank single:19197::none,deferred due to recent antihistamine use,deferred due to insurance stipulations that require a separate visit for testing,labs sent instead, }  Spirometry: {Blank single:19197::results normal (FEV1: ***%, FVC: ***%, FEV1/FVC: ***%),results abnormal (FEV1: ***%, FVC: ***%, FEV1/FVC: ***%)}.    {Blank single:19197::Spirometry consistent with mild obstructive disease,Spirometry consistent with moderate obstructive disease,Spirometry consistent with severe obstructive disease,Spirometry consistent with possible restrictive disease,Spirometry consistent with mixed obstructive and restrictive disease,Spirometry uninterpretable due to technique,Spirometry consistent with normal pattern}. {Blank single:19197::Albuterol /Atrovent  nebulizer,Xopenex/Atrovent nebulizer,Albuterol  nebulizer,Albuterol  four puffs via MDI,Xopenex four puffs via MDI} treatment given in clinic with {Blank single:19197::significant improvement in FEV1 per ATS criteria,significant improvement in FVC per ATS criteria,significant improvement in FEV1 and FVC per ATS criteria,improvement in FEV1, but not significant per ATS criteria,improvement in FVC, but not significant per ATS criteria,improvement in FEV1 and FVC, but not significant per ATS criteria,no improvement}.  Allergy  Studies: {Blank single:19197::none,deferred due to recent antihistamine use,deferred due to insurance stipulations that require a separate visit for testing,labs sent instead, }    {Blank single:19197::Allergy  testing results were read and interpreted by myself, documented by clinical staff., }      Marty Shaggy, MD  [facility]        "

## 2024-07-30 NOTE — Patient Instructions (Addendum)
 1. Rash - We failed cyclosporine  and azathioprine  - We can NOW get Rinvoq approved, hopefully.  - Call Tammy to get this process started: [phone] - Additional samples provided today (start the 30mg  daily, but we will decrease down to 15mg  in the script to the pharmacy). - Continue with on clobetasol  shampoo twice daily as needed.  - Continue with triamcinolone  cream twice daily as needed (for the upper chest).  2. Exercise induced bronchospasm - Lung testing not done today. - Daily controller medication(s): NOTHING - Prior to physical activity: albuterol  2 puffs 10-15 minutes before physical activity. - Rescue medications: albuterol  4 puffs every 4-6 hours as needed - Asthma control goals:  * Full participation in all desired activities (may need albuterol  before activity) * Albuterol  use two time or less a week on average (not counting use with activity) * Cough interfering with sleep two time or less a month * Oral steroids no more than once a year * No hospitalizations  4. Seasonal and perennial allergic rhinitis (grass, dog) - Continue with cetirizine  10mg  daily. - Continue with levocetirizine 5 mg daily. - Continue with Flonase  one spray per nostril daily as needed.   5. Contact dermatitis - with sensitizations to p-tert butylphenol formaldehyde resin as well as Cl+ Me-Isothiazolinone, gold, cobalt, methylisothiazonlione, fragrance mix 1, and propolis - Continue to avoid all of your triggering chemicals. - Cobalt is in vitamin B12, so you might need to limit that. - Propolis is definitely honey, so it is a good idea to limit that.   6. Return in about 3 months (around 10/28/2024). You can have the follow up appointment with Dr. Iva or a Nurse Practicioner (our Nurse Practitioners are excellent and always have Physician oversight!).    Please inform us  of any Emergency Department visits, hospitalizations, or changes in symptoms. Call us  before going to the ED for  breathing or allergy  symptoms since we might be able to fit you in for a sick visit. Feel free to contact us  anytime with any questions, problems, or concerns.  It was a pleasure to see you again today!  Websites that have reliable patient information: 1. American Academy of Asthma, Allergy , and Immunology: www.aaaai.org 2. Food Allergy  Research and Education (FARE): foodallergy.org 3. Mothers of Asthmatics: http://www.asthmacommunitynetwork.org 4. American College of Allergy , Asthma, and Immunology: www.acaai.org      Like us  on [employer] and Instagram for our latest updates!      A healthy democracy works best when [employer] participate! Make sure you are registered to vote! If you have moved or changed any of your contact information, you will need to get this updated before voting! Scan the QR codes below to learn more!

## 2024-07-31 ENCOUNTER — Encounter: Payer: Self-pay | Admitting: Allergy & Immunology

## 2024-07-31 ENCOUNTER — Telehealth: Payer: Self-pay | Admitting: Allergy & Immunology

## 2024-07-31 NOTE — Telephone Encounter (Signed)
Routing to Tammy.  

## 2024-07-31 NOTE — Telephone Encounter (Signed)
 A rep for [facility], [place] T. [phone]. He is  trying to to get a PA for [place] for this patient. He said if there is anything that he can do to help this processes to let him know. He states that you would need to go to  [email or web address], and start the PA process from there. Please contact him with any questions.

## 2024-08-01 NOTE — Telephone Encounter (Signed)
 L/m for patient that I still need to get updated Ins card with correct PBM info her old card has [place]  [employer] and they advised her Rx coverage expired 12/17/23

## 2024-09-13 ENCOUNTER — Ambulatory Visit: Admitting: Family Medicine

## 2024-10-29 ENCOUNTER — Ambulatory Visit: Admitting: Allergy & Immunology
